# Patient Record
Sex: Male | Born: 1995 | Hispanic: No | Marital: Married | Smoking: Never smoker
Health system: Southern US, Community
[De-identification: ages and names within clinical notes are randomized; demographics above are authoritative.]

## PROBLEM LIST (undated history)

## (undated) DIAGNOSIS — H539 Unspecified visual disturbance: Secondary | ICD-10-CM

## (undated) DIAGNOSIS — R0789 Other chest pain: Secondary | ICD-10-CM

## (undated) DIAGNOSIS — H6983 Other specified disorders of Eustachian tube, bilateral: Secondary | ICD-10-CM

## (undated) DIAGNOSIS — J329 Chronic sinusitis, unspecified: Secondary | ICD-10-CM

## (undated) DIAGNOSIS — F341 Dysthymic disorder: Secondary | ICD-10-CM

## (undated) HISTORY — DX: Other specified disorders of eustachian tube, bilateral: H69.83

## (undated) HISTORY — DX: Unspecified visual disturbance: H53.9

## (undated) HISTORY — DX: Dysthymic disorder: F34.1

## (undated) HISTORY — DX: Other chest pain: R07.89

## (undated) HISTORY — DX: Chronic sinusitis, unspecified: J32.9

---
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
## 2016-05-18 ENCOUNTER — Encounter: Payer: Self-pay | Admitting: Osteopathic Medicine

## 2016-05-18 ENCOUNTER — Ambulatory Visit (INDEPENDENT_AMBULATORY_CARE_PROVIDER_SITE_OTHER): Payer: BLUE CROSS/BLUE SHIELD | Admitting: Osteopathic Medicine

## 2016-05-18 VITALS — BP 123/67 | HR 63 | Temp 98.5°F | Ht 66.0 in | Wt 148.0 lb

## 2016-05-18 DIAGNOSIS — H539 Unspecified visual disturbance: Secondary | ICD-10-CM

## 2016-05-18 DIAGNOSIS — J329 Chronic sinusitis, unspecified: Secondary | ICD-10-CM

## 2016-05-18 DIAGNOSIS — J019 Acute sinusitis, unspecified: Secondary | ICD-10-CM | POA: Diagnosis not present

## 2016-05-18 HISTORY — DX: Chronic sinusitis, unspecified: J32.9

## 2016-05-18 HISTORY — DX: Unspecified visual disturbance: H53.9

## 2016-05-18 MED ORDER — AMOXICILLIN-POT CLAVULANATE 875-125 MG PO TABS: 1.0000 | ORAL_TABLET | Freq: Two times a day (BID) | ORAL | 0 refills | Status: DC

## 2016-05-18 NOTE — Patient Instructions (Addendum)
I think your various symptoms can most easily be explained by a bacterial sinus infection and the pressure causing some headache & ear pain and feelings of malaise. We are going to treat this with antibiotics. If you are not doing any better after 5-7 days on the medication, or if you get worse or experience any significant changes in your symptoms seemed than that, please let us know. The next step would be to reexamine, get blood work, get a CT of the head/sinuses to evaluate for any other problems and confirm presence or resolution of a sinus infection. I advise you not to use marijuana.   Your vision screen in the office was normal. If you continue to have vision problems, I would recommend that he see an eye doctor since it has been 2 years since your last formal testing. Your glasses/contacts prescription may need to be updated.    If you have any other questions or concerns, please don't hesitate to give us a call. Otherwise, plan to follow-up for annual physical at your convenience.

## 2016-05-19 NOTE — Progress Notes (Signed)
HPI: Cody Roberts is a 20 y.o. male  who presents to [facility] today, 05/19/16,  for chief complaint of:  Chief Complaint  Patient presents with  . Establish Care    sinus problems     . Context: Sinus congestion, started beginning of this week. Works as a delivery person, had to call out of work. Also reporting some vision changes, more trouble with distance vision. . Location: Sinuses bilaterally . Quality: Congestion, some sore throat . Modifying factors: Intermittent marijuana use . Assoc signs/symptoms: No fever or chills  Patient is accompanied by grandmother who assists with history-taking.   Past medical, surgical, social and family history reviewed: No past medical history on file. No past surgical history on file. Social History  Substance Use Topics  . Smoking status: Never Smoker  . Smokeless tobacco: Never Used  . Alcohol use No   No family history on file.   Current medication list and allergy/intolerance information reviewed:   Current Outpatient Prescriptions  Medication Sig Dispense Refill  . amoxicillin-clavulanate (AUGMENTIN) 875-125 MG tablet Take 1 tablet by mouth 2 (two) times daily. 14 tablet 0   No current facility-administered medications for this visit.    No Known Allergies    Review of Systems:  Constitutional:  No  fever, no chills, +recent illness, No unintentional weight changes. No significant fatigue.   HEENT: No  headache, +vision change, no hearing change, +sore throat, +sinus pressure  Cardiac: No  chest pain, No  pressure, No palpitations, No  Orthopnea  Respiratory:  No  shortness of breath. +Cough  Gastrointestinal: No  abdominal pain, No  nausea, No  vomiting,  No  blood in stool, No  diarrhea, No  constipation   Musculoskeletal: No new myalgia/arthralgia  Genitourinary: No  incontinence, No  abnormal genital bleeding, No abnormal genital discharge  Skin: No  Rash, No other  wounds/concerning lesions  Hem/Onc: No  easy bruising/bleeding, No  abnormal lymph node  Endocrine: No cold intolerance,  No heat intolerance. No polyuria/polydipsia/polyphagia   Neurologic: No  weakness, No  dizziness, No  slurred speech/focal weakness/facial droop  Psychiatric: No  concerns with depression, No  concerns with anxiety, No sleep problems, No mood problems  Exam:  BP 123/67   Pulse 63   Temp 98.5 F (36.9 C) (Oral)   Ht 5\' 6"  (1.676 m)   Wt 148 lb (67.1 kg)   BMI 23.89 kg/m   Constitutional: VS see above. General Appearance: alert, well-developed, well-nourished, NAD  Eyes: Normal lids and conjunctive, non-icteric sclera  Ears, Nose, Mouth, Throat: MMM, Normal external inspection ears/nares/mouth/lips/gums. TM normal bilaterally. Pharynx/tonsils no erythema, no exudate. Nasal mucosa normal. PERRLA, EOMI  Neck: No masses, trachea midline. No thyroid enlargement. No tenderness/mass appreciated. No lymphadenopathy  Respiratory: Normal respiratory effort. no wheeze, no rhonchi, no rales  Cardiovascular: S1/S2 normal, no murmur, no rub/gallop auscultated. RRR. No lower extremity edema.     Musculoskeletal: Gait normal. No clubbing/cyanosis of digits.   Neurological:  Normal balance/coordination. No tremor.   Skin: warm, dry, intact. No rash/ulcer.   Psychiatric: Normal judgment/insight. Flattened mood and affect. Oriented x3.     ASSESSMENT/PLAN: Nonspecific sinus congestion, vague malaise-type illness. I think most likely bacterial sinusitis, patient was recently sick with more upper respiratory/cold symptoms. Declines work note. Vision screen normal here in the office but would still recommend formal follow-up since he is noticing some vision changes. I have a low suspicion for other neurologic process going on,  advised discontinue marijuana use, if symptoms persist/get worse, seek care ASAP  Subacute sinusitis, unspecified location - Plan:  amoxicillin-clavulanate (AUGMENTIN) 875-125 MG tablet  Vision changes   Patient Instructions  I think your various symptoms can most easily be explained by a bacterial sinus infection and the pressure causing some headache & ear pain and feelings of malaise. We are going to treat this with antibiotics. If you are not doing any better after 5-7 days on the medication, or if you get worse or experience any significant changes in your symptoms seemed than that, please let us know. The next step would be to reexamine, get blood work, get a CT of the head/sinuses to evaluate for any other problems and confirm presence or resolution of a sinus infection. I advise you not to use marijuana.   Your vision screen in the office was normal. If you continue to have vision problems, I would recommend that he see an eye doctor since it has been 2 years since your last formal testing. Your glasses/contacts prescription may need to be updated.    If you have any other questions or concerns, please don't hesitate to give us a call. Otherwise, plan to follow-up for annual physical at your convenience.    Visit summary with medication list and pertinent instructions was printed for patient to review. All questions at time of visit were answered - patient instructed to contact office with any additional concerns. ER/RTC precautions were reviewed with the patient. Follow-up plan: Return if symptoms worsen or fail to improve.

## 2017-04-18 ENCOUNTER — Ambulatory Visit (INDEPENDENT_AMBULATORY_CARE_PROVIDER_SITE_OTHER): Payer: BC Managed Care – PPO | Admitting: Osteopathic Medicine

## 2017-04-18 ENCOUNTER — Ambulatory Visit (INDEPENDENT_AMBULATORY_CARE_PROVIDER_SITE_OTHER): Payer: BC Managed Care – PPO

## 2017-04-18 ENCOUNTER — Encounter: Payer: Self-pay | Admitting: Osteopathic Medicine

## 2017-04-18 VITALS — BP 137/90 | HR 67 | Wt 167.0 lb

## 2017-04-18 DIAGNOSIS — R0789 Other chest pain: Secondary | ICD-10-CM

## 2017-04-18 DIAGNOSIS — F341 Dysthymic disorder: Secondary | ICD-10-CM | POA: Diagnosis not present

## 2017-04-18 DIAGNOSIS — Z23 Encounter for immunization: Secondary | ICD-10-CM

## 2017-04-18 DIAGNOSIS — H6983 Other specified disorders of Eustachian tube, bilateral: Secondary | ICD-10-CM

## 2017-04-18 HISTORY — DX: Other chest pain: R07.89

## 2017-04-18 NOTE — Patient Instructions (Addendum)
Plan:  Heart -  Echocardiogram and Holter Monitor to evaluate structure and rhythm of the heart Cardiology referral in case symptoms persist or tests are abnormal   Sinus -  Try Flonase +/- Claritin or similar  Mood -  Placed order for counseling, let me know if you'd like to try medications or have any other concerns

## 2017-04-18 NOTE — Progress Notes (Signed)
HPI: Cody Roberts is a 21 y.o. male  who presents to [facility] today, 04/18/17,  for chief complaint of:  Chief Complaint  Patient presents with  . Chest Pain    Chest discomfort: new problem - over the past 2 weeks, has noted initially feeling of chest tightness followed by intermittent feelings of chest discomfort, seems to migrate around chest, doesn't seem to be triggered or relieved by anything. No chest pain or pressure with exertion. No shortness of breath, no dizziness.  Ear fullness: Comes and goes, bilateral ear fullness sensation and occasional popping sensation, thinks he may need ears flushed today. No hearing impairment, no headache or vision change.  Positive depression screening: new problem - Patient states that he has struggled with depression on and off for many years. Has never felt the need to be on medications and does not want to pursue medications at this point but he would like to speak to a counselor.  Patient is accompanied by mom who assists with history-taking.   Past medical, surgical, social and family history reviewed: Patient Active Problem List   Diagnosis Date Noted  . Sinusitis 05/18/2016  . Vision changes 05/18/2016   No past surgical history on file. Social History  Substance Use Topics  . Smoking status: Never Smoker  . Smokeless tobacco: Never Used  . Alcohol use No   No family history on file.   Current medication list and allergy/intolerance information reviewed:   Current Outpatient Prescriptions  Medication Sig Dispense Refill  . amoxicillin-clavulanate (AUGMENTIN) 875-125 MG tablet Take 1 tablet by mouth 2 (two) times daily. 14 tablet 0   No current facility-administered medications for this visit.    No Known Allergies    Review of Systems:  Constitutional:  No  fever, no chills, No recent illness, No unintentional weight changes.  HEENT: No  headache, no vision change, no hearing  change, No sore throat, +ear and sinus pressure  Cardiac: +chest pain, +pressure, see HPI, No palpitations, No  Orthopnea  Respiratory:  No  shortness of breath. No  Cough  Gastrointestinal: No  abdominal pain, No  nausea, No  vomiting  Musculoskeletal: No new myalgia/arthralgia  Skin: No  Rash  Neurologic: No  weakness, No  dizziness, No  slurred No weakness/facial droop  Psychiatric: +concerns with depression, +concerns with anxiety, No sleep problems, No mood problems  Exam:  BP 137/90   Pulse 67   Wt 167 lb (75.8 kg)   BMI 26.95 kg/m   Constitutional: VS see above. General Appearance: alert, well-developed, well-nourished, NAD  Eyes: Normal lids and conjunctive, non-icteric sclera  Ears, Nose, Mouth, Throat: MMM, Normal external inspection ears/nares/mouth/lips/gums. TM normal bilaterally and minimal cerumen in external canals. Pharynx/tonsils no erythema, no exudate. Nasal mucosa normal.   Neck: No masses, trachea midline. No thyroid enlargement. No tenderness/mass appreciated. No lymphadenopathy  Respiratory: Normal respiratory effort. no wheeze, no rhonchi, no rales  Cardiovascular: S1/S2 normal, no murmur, no rub/gallop auscultated. RRR. No lower extremity edema. Chest wall is nontender  Gastrointestinal: Nontender, no masses.   Musculoskeletal: Gait normal. Symmetric and independent movement all extremities, strength 5 out of 5 in all extremities  Neurological: Normal balance/coordination. No tremor. No cranial nerve deficit on limited exam. Motor and sensation intact and symmetric. Cerebellar reflexes intact.     Psychiatric: Normal judgment/insight. Normal mood and affect. Oriented x3.    Dg Chest 2 View  Result Date: 04/18/2017 CLINICAL DATA:  Chest discomfort for 2  weeks. EXAM: CHEST  2 VIEW COMPARISON:  None. FINDINGS: The heart size and mediastinal contours are within normal limits. Both lungs are clear. No evidence of pneumothorax or pleural effusion. The  visualized skeletal structures are unremarkable. IMPRESSION: Negative.  No active cardiopulmonary disease. Electronically Signed   By: Myles Rosenthal M.D.   On: 04/18/2017 15:49   Depression screen PHQ 2/9 04/18/2017  Decreased Interest 3  Down, Depressed, Hopeless 2  PHQ - 2 Score 5  Altered sleeping 2  Tired, decreased energy 1  Change in appetite 1  Feeling bad or failure about yourself  2  Trouble concentrating 0  Moving slowly or fidgety/restless 0  Suicidal thoughts 0  PHQ-9 Score 11    EKG interpretation: Rate: 67  Rhythm: sinus No ST/T changes concerning for acute ischemia/infarct     ASSESSMENT/PLAN:   Chest tightness - Vague symptoms of discomfort/pain without angina or other alarm symptoms. Consider respiratory or psychiatric component versus cardiac. W/u and refer - Plan: EKG 12-Lead, Holter monitor - 48 hour, ECHOCARDIOGRAM COMPLETE, Ambulatory referral to Cardiology, CBC, COMPLETE METABOLIC PANEL WITH GFR, Lipid panel, TSH, Magnesium, DG Chest 2 View  Need for immunization against influenza - Plan: Flu Vaccine QUAD 36+ mos IM  Dysthymia - Referral downstairs to behavioral health for counseling, patient is advised on mental health safety precautions, can come to me if he would like to discuss meds - Plan: CBC, COMPLETE METABOLIC PANEL WITH GFR, TSH, Ambulatory referral to Behavioral Health  Eustachian tube dysfunction, bilateral - Canals clear and tympanic membranes normal, advised OTC Flonase and or second generation antihistamine Claritin/Zyrtec/Allegra or similar generic    Patient Instructions  Plan:  Heart -  Echocardiogram and Holter Monitor to evaluate structure and rhythm of the heart Cardiology referral in case symptoms persist or tests are abnormal   Sinus -  Try Flonase +/- Claritin or similar  Mood -  Placed order for counseling, let me know if you'd like to try medications or have any other concerns     Visit summary with medication list and  pertinent instructions was printed for patient to review. All questions at time of visit were answered - patient instructed to contact office with any additional concerns. ER/RTC precautions were reviewed with the patient. Follow-up plan: Return in about 2 weeks (around 05/02/2017) for recheck symptoms, annual physical when due .  Note: Total time spent 40 minutes, greater than 50% of the visit was spent face-to-face counseling and coordinating care for the following: The primary encounter diagnosis was Chest tightness. Diagnoses of Need for immunization against influenza, Dysthymia, and Eustachian tube dysfunction, bilateral were also pertinent to this visit.Marland Kitchen

## 2017-04-19 DIAGNOSIS — F341 Dysthymic disorder: Secondary | ICD-10-CM

## 2017-04-19 DIAGNOSIS — H6983 Other specified disorders of Eustachian tube, bilateral: Secondary | ICD-10-CM | POA: Insufficient documentation

## 2017-04-19 DIAGNOSIS — H6993 Unspecified Eustachian tube disorder, bilateral: Secondary | ICD-10-CM

## 2017-04-19 HISTORY — DX: Unspecified eustachian tube disorder, bilateral: H69.93

## 2017-04-19 HISTORY — DX: Dysthymic disorder: F34.1

## 2017-04-19 HISTORY — DX: Other specified disorders of eustachian tube, bilateral: H69.83

## 2017-05-09 ENCOUNTER — Ambulatory Visit: Payer: BC Managed Care – PPO | Admitting: Cardiology

## 2017-06-04 ENCOUNTER — Telehealth (HOSPITAL_COMMUNITY): Payer: Self-pay | Admitting: Osteopathic Medicine

## 2017-06-04 NOTE — Telephone Encounter (Signed)
04/19/17 VM not setup unable to leave message EVD 04/20/17 spoke with patient. He is f/w Dr. Dulce Sellar to see if test needs to be done. evd 05/23/17 spoke with patient. He still has not spoken to Dr. Dulce Sellar regarding the echo evd 06/04/17 Called pt and lmsg for him to CB to get scheduled for echo..RG  He will be removed from the workqueue.

## 2018-09-05 ENCOUNTER — Ambulatory Visit (INDEPENDENT_AMBULATORY_CARE_PROVIDER_SITE_OTHER): Payer: BC Managed Care – PPO | Admitting: Osteopathic Medicine

## 2018-09-05 ENCOUNTER — Encounter: Payer: Self-pay | Admitting: Osteopathic Medicine

## 2018-09-05 VITALS — BP 131/81 | HR 63 | Wt 153.7 lb

## 2018-09-05 DIAGNOSIS — F322 Major depressive disorder, single episode, severe without psychotic features: Secondary | ICD-10-CM | POA: Diagnosis not present

## 2018-09-05 DIAGNOSIS — Z23 Encounter for immunization: Secondary | ICD-10-CM | POA: Diagnosis not present

## 2018-09-05 MED ORDER — FLUOXETINE HCL 10 MG PO CAPS: 10.0000 mg | ORAL_CAPSULE | Freq: Every day | ORAL | 0 refills | Status: DC

## 2018-09-05 NOTE — Patient Instructions (Signed)
We are starting a medication today called fluoxetine aka Prozac to help treat your dysthymia/depression. This is a daily medication to help your symptoms.   Continue counseling!   Expect a call or message form this office in the next 2 weeks to check in: If you're doing well on the medicine but not feeling any effect, we can increase the dose. If you're starting to feel some effect/improvement, we can hold off on a dose increase and reevaluate at your office visit.   Let's plan to follow up in the office in 4-6 weeks. At that time, we can talk about how well the medicine is working for you, and we can consider increasing the dose, adding another medicine, etc.   If we are having trouble finding a good medication regimen for you, we can consult with a psychiatrist to assist with medication management.   If you experience problematic side effects, please let me know ASAP - we can switch the medicine any time, and we don't need an appointment for this. If you have any concerns, please call me!  For immediate mental health services:  [facility], [address] [place], [place] [address], [phone]  [facility], [address], [place], [place] [address], [phone]  Any emergency room  National Suicide Prevention Lifeline, [phone]  Any questions or concerns, call me!

## 2018-09-05 NOTE — Progress Notes (Signed)
HPI: Cody Roberts is a 23 y.o. male who  has a past medical history of Chest tightness (04/18/2017), Dysthymia (04/19/2017), Eustachian tube dysfunction, bilateral (04/19/2017), Sinusitis (05/18/2016), and Vision changes (05/18/2016).  he presents to [facility] today, 09/05/18,  for chief complaint of:  Severe depression, anxiety  . Anxiety and Depression are getting really bad. He lost his job, dropped out of school this August. He is noting more mood problems lately.  . Modifying factors: seeing counselor, working diagnosis of MDD and dysthymia per patient.  . Assoc signs/symptoms: anxiety, trouble sleeping, low appetitie   Depression screen [facility] 2/9 09/05/2018 04/18/2017  Decreased Interest 3 3  Down, Depressed, Hopeless 3 2  PHQ - 2 Score 6 5  Altered sleeping 3 2  Tired, decreased energy 2 1  Change in appetite 3 1  Feeling bad or failure about yourself  3 2  Trouble concentrating 2 0  Moving slowly or fidgety/restless 2 0  Suicidal thoughts 2 0  PHQ-9 Score 23 11  Difficult doing work/chores Extremely dIfficult -   GAD 7 : Generalized Anxiety Score 09/05/2018  Nervous, Anxious, on Edge 2  Control/stop worrying 3  Worry too much - different things 3  Trouble relaxing 2  Restless 1  Easily annoyed or irritable 3  Afraid - awful might happen 3  Total GAD 7 Score 17  Anxiety Difficulty Very difficult          At today's visit... Past medical history, surgical history, and family history reviewed and updated as needed.  Current medication list and allergy/intolerance information reviewed and updated as needed. (See remainder of HPI, ROS, Phys Exam below)        ASSESSMENT/PLAN: The primary encounter diagnosis was Current severe episode of major depressive disorder without psychotic features, unspecified whether recurrent (HCC). Diagnoses of Need for influenza vaccination and Need for Tdap vaccination were also pertinent to this  visit.  Discussion of risks versus benefits of medication, patient feels ready to try some sort of medicine as well as continue counseling.  Reports occasional suicidal ideation but states "this would be a permanent solution to a temporary problem" so he does not have any plans to harm himself, does not feel that he is a danger to his own wellbeing.  Patient was verbally contracted for safety, resources provided for urgent psychiatric care if needed, see patient instructions.  Orders Placed This Encounter  Procedures  . Flu Vaccine QUAD 6+ mos PF IM (Fluarix Quad PF)  . Tdap vaccine greater than or equal to 7yo IM  . CBC  . COMPLETE METABOLIC PANEL WITH GFR  . TSH  . Magnesium     Meds ordered this encounter  Medications  . FLUoxetine (PROZAC) 10 MG capsule    Sig: Take 1 capsule (10 mg total) by mouth daily.    Dispense:  90 capsule    Refill:  0    Patient Instructions  We are starting a medication today called fluoxetine aka Prozac to help treat your dysthymia/depression. This is a daily medication to help your symptoms.   Continue counseling!   Expect a call or message form this office in the next 2 weeks to check in: If you're doing well on the medicine but not feeling any effect, we can increase the dose. If you're starting to feel some effect/improvement, we can hold off on a dose increase and reevaluate at your office visit.   Let's plan to follow up in the  office in 4-6 weeks. At that time, we can talk about how well the medicine is working for you, and we can consider increasing the dose, adding another medicine, etc.   If we are having trouble finding a good medication regimen for you, we can consult with a psychiatrist to assist with medication management.   If you experience problematic side effects, please let me know ASAP - we can switch the medicine any time, and we don't need an appointment for this. If you have any concerns, please call me!  For immediate mental  health services:  [facility], [address], [place], [place] [address], [phone]  [facility], [address] [place], [place] [address], [phone]  Any emergency room  National Suicide Prevention Lifeline, [phone]  Any questions or concerns, call me!       Follow-up plan: Return in about 4 weeks (around 10/03/2018) for recheck moods on new medicine. See me sooner if needed! .                             ############################################ ############################################ ############################################ ############################################    No outpatient medications have been marked as taking for the 09/05/18 encounter (Office Visit) with Sunnie Nielsen, DO.    No Known Allergies     Review of Systems:  Constitutional: No recent illness  HEENT: No  headache, no vision change  Cardiac: No  chest pain, No  pressure, No palpitations  Respiratory:  No  shortness of breath. No  Cough  Neurologic: No  weakness, No  Dizziness  Psychiatric: +concerns with depression, +concerns with anxiety  Exam:  BP 131/81 (BP Location: Left Arm, Patient Position: Sitting, Cuff Size: Normal)   Pulse 63   Wt 153 lb 11.2 oz (69.7 kg)   BMI 24.81 kg/m   Constitutional: VS see above. General Appearance: alert, well-developed, well-nourished, NAD  Eyes: Normal lids and conjunctive, non-icteric sclera  Ears, Nose, Mouth, Throat: MMM, Normal external inspection ears/nares/mouth/lips/gums.  Neck: No masses, trachea midline.   Respiratory: Normal respiratory effort. no wheeze, no rhonchi, no rales  Cardiovascular: S1/S2 normal, no murmur, no rub/gallop auscultated. RRR.   Musculoskeletal: Gait normal. Symmetric and independent movement of all extremities  Neurological: Normal balance/coordination. No tremor.  Skin: warm, dry, intact.    Psychiatric: Normal judgment/insight. Normal mood and affect. Oriented x3.       Visit summary with medication list and pertinent instructions was printed for patient to review, patient was advised to alert [place] if any updates are needed. All questions at time of visit were answered - patient instructed to contact office with any additional concerns. ER/RTC precautions were reviewed with the patient and understanding verbalized.   Note: Total time spent 25 minutes, greater than 50% of the visit was spent face-to-face counseling and coordinating care for the following: The primary encounter diagnosis was Current severe episode of major depressive disorder without psychotic features, unspecified whether recurrent (HCC). Diagnoses of Need for influenza vaccination and Need for Tdap vaccination were also pertinent to this visit.Marland Kitchen  Please note: voice recognition software was used to produce this document, and typos may escape review. Please contact Dr. Lyn Hollingshead for any needed clarifications.    Follow up plan: Return in about 4 weeks (around 10/03/2018) for recheck moods on new medicine. See me sooner if needed! Marland Kitchen

## 2018-09-06 ENCOUNTER — Encounter: Payer: Self-pay | Admitting: Osteopathic Medicine

## 2018-09-06 LAB — CBC
HCT: 47.6 % (ref 38.5–50.0)
Hemoglobin: 17.3 g/dL — ABNORMAL HIGH (ref 13.2–17.1)
MCH: 31.1 pg (ref 27.0–33.0)
MCHC: 36.3 g/dL — AB (ref 32.0–36.0)
MCV: 85.6 fL (ref 80.0–100.0)
MPV: 12.5 fL (ref 7.5–12.5)
PLATELETS: 207 10*3/uL (ref 140–400)
RBC: 5.56 10*6/uL (ref 4.20–5.80)
RDW: 13.6 % (ref 11.0–15.0)
WBC: 3 10*3/uL — AB (ref 3.8–10.8)

## 2018-09-06 LAB — COMPLETE METABOLIC PANEL WITH GFR
AG RATIO: 2.4 (calc) (ref 1.0–2.5)
ALBUMIN MSPROF: 5.1 g/dL (ref 3.6–5.1)
ALT: 13 U/L (ref 9–46)
AST: 17 U/L (ref 10–40)
Alkaline phosphatase (APISO): 63 U/L (ref 40–115)
BUN: 10 mg/dL (ref 7–25)
CO2: 28 mmol/L (ref 20–32)
CREATININE: 1.17 mg/dL (ref 0.60–1.35)
Calcium: 10.2 mg/dL (ref 8.6–10.3)
Chloride: 101 mmol/L (ref 98–110)
GFR, EST NON AFRICAN AMERICAN: 88 mL/min/{1.73_m2} (ref >=60)
GFR, Est African American: 102 mL/min/{1.73_m2} (ref >=60)
GLOBULIN: 2.1 g/dL (ref 1.9–3.7)
Glucose, Bld: 88 mg/dL (ref 65–99)
POTASSIUM: 4.8 mmol/L (ref 3.5–5.3)
SODIUM: 137 mmol/L (ref 135–146)
Total Bilirubin: 1.7 mg/dL — ABNORMAL HIGH (ref 0.2–1.2)
Total Protein: 7.2 g/dL (ref 6.1–8.1)

## 2018-09-06 LAB — MAGNESIUM: MAGNESIUM: 2.1 mg/dL (ref 1.5–2.5)

## 2018-09-06 LAB — TSH: TSH: 1.71 mIU/L (ref 0.40–4.50)

## 2018-10-07 ENCOUNTER — Encounter: Payer: Self-pay | Admitting: Osteopathic Medicine

## 2018-10-07 ENCOUNTER — Ambulatory Visit (INDEPENDENT_AMBULATORY_CARE_PROVIDER_SITE_OTHER): Payer: BC Managed Care – PPO | Admitting: Osteopathic Medicine

## 2018-10-07 VITALS — BP 133/83 | HR 91 | Temp 98.2°F | Wt 152.4 lb

## 2018-10-07 DIAGNOSIS — F322 Major depressive disorder, single episode, severe without psychotic features: Secondary | ICD-10-CM

## 2018-10-07 MED ORDER — FLUOXETINE HCL 10 MG PO CAPS
20.0000 mg | ORAL_CAPSULE | Freq: Every day | ORAL | 0 refills | Status: DC
Start: 2018-10-07 — End: 2018-11-05

## 2018-10-07 NOTE — Patient Instructions (Addendum)
This week:  Increase fluoxetine from 10 mg to 20 mg - double up on the pills you're taking. Will call you next Monday to see if this is helping.   Please call me ASAP if you are having troubling side effects!   If doing ok, can increase further and/or add another medicine, Welbutrin  If the Fluoxetine +/- Wellbutrin isn't helping, will see if we can get you approved for one of the newer medicines, Trintellix / Viibryd.   For immediate mental health services if needed:   [facility], [address], [place], [place] [address], [phone]  [facility], [address] [place], [place] [address], [phone]  Any emergency room  National Suicide Prevention Lifeline, [phone]   Will plan to recheck blood work in a couple months just to monitor minor abnormalities.

## 2018-10-07 NOTE — Progress Notes (Signed)
HPI: Cody Roberts is a 23 y.o. male who  has a past medical history of Chest tightness (04/18/2017), Dysthymia (04/19/2017), Eustachian tube dysfunction, bilateral (04/19/2017), Sinusitis (05/18/2016), and Vision changes (05/18/2016).  he presents to [facility] today, 10/07/18,  for chief complaint of:  Depression follow-up  We decided last visit to start fluoxetine 10 mg and pt to continue counseling.   He has been taking the medicine but not noticing any difference in moods, maybe a bit less anxiety but he's also not really leaving home much. Suicidal thoughts have decreased.   He feels he's plateau'ed a bit with counseling but still going.    Depression screen [facility] 2/9 10/07/2018 09/05/2018 04/18/2017  Decreased Interest 3 3 3   Down, Depressed, Hopeless 3 3 2   PHQ - 2 Score 6 6 5   Altered sleeping 3 3 2   Tired, decreased energy 3 2 1   Change in appetite 2 3 1   Feeling bad or failure about yourself  3 3 2   Trouble concentrating 2 2 0  Moving slowly or fidgety/restless 2 2 0  Suicidal thoughts 1 2 0  PHQ-9 Score 22 23 11   Difficult doing work/chores Extremely dIfficult Extremely dIfficult -   GAD 7 : Generalized Anxiety Score 10/07/2018 09/05/2018  Nervous, Anxious, on Edge 2 2  Control/stop worrying 1 3  Worry too much - different things 2 3  Trouble relaxing 1 2  Restless 1 1  Easily annoyed or irritable 3 3  Afraid - awful might happen 3 3  Total GAD 7 Score 13 17  Anxiety Difficulty Extremely difficult Very difficult          At today's visit 10/07/18 ... PMH, PSH, FH reviewed and updated as needed.  Current medication list and allergy/intolerance hx reviewed and updated as needed. (See remainder of HPI, ROS, Phys Exam below)       ASSESSMENT/PLAN: The encounter diagnosis was Current severe episode of major depressive disorder without psychotic features, unspecified whether recurrent (HCC).  Increase Prozac. Low threshold for  augmenting with Wellbutrin or Ability. Consider newer Trintellix or Viibryd if the above isn't making a difference.    Meds ordered this encounter  Medications  . FLUoxetine (PROZAC) 10 MG capsule    Sig: Take 2 capsules (20 mg total) by mouth daily.    Dispense:  90 capsule    Refill:  0    Patient Instructions  This week:  Increase fluoxetine from 10 mg to 20 mg - double up on the pills you're taking. Will call you next Monday to see if this is helping.   Please call me ASAP if you are having troubling side effects!   If doing ok, can increase further and/or add another medicine, Welbutrin  If the Fluoxetine +/- Wellbutrin isn't helping, will see if we can get you approved for one of the newer medicines, Trintellix / Viibryd.   For immediate mental health services if needed:   [facility], [address], [place], [place] [address], [phone]  [facility], [address], [place], [place] [address], [phone]  Any emergency room  National Suicide Prevention [facility]eline, [phone]   Will plan to recheck blood work in a couple months just to monitor minor abnormalities.         Follow-up plan: Return in about 4 weeks (around 11/04/2018) for recheck moods, sooner if needed.                                                 ################################################# ################################################# ################################################# #################################################  Current Meds  Medication Sig  . FLUoxetine (PROZAC) 10 MG capsule Take 2 capsules (20 mg total) by mouth daily.  . [DISCONTINUED] FLUoxetine (PROZAC) 10 MG capsule Take 1 capsule (10 mg total) by mouth daily.    No Known Allergies     Review of Systems:  Constitutional: No recent illness  HEENT: No  headache  Respiratory:  No  shortness  of breath.   Gastrointestinal: No  abdominal pain, no change on bowel habits  Neurologic: No  weakness, No  Dizziness  Psychiatric: +concerns with depression, +concerns with anxiety  Exam:  BP 133/83 (BP Location: Left Arm, Patient Position: Sitting, Cuff Size: Normal)   Pulse 91   Temp 98.2 F (36.8 C) (Oral)   Wt 152 lb 6.4 oz (69.1 kg)   BMI 24.60 kg/m   Constitutional: VS see above. General Appearance: alert, well-developed, well-nourished, NAD  Respiratory: Normal respiratory effort.   Musculoskeletal: Gait normal. Symmetric and independent movement of all extremities  Neurological: Normal balance/coordination. No tremor.  Skin: warm, dry, intact.   Psychiatric: Normal judgment/insight. Normal mood and affect. Oriented x3.       Visit summary with medication list and pertinent instructions was printed for patient to review, patient was advised to alert [place] if any updates are needed. All questions at time of visit were answered - patient instructed to contact office with any additional concerns. ER/RTC precautions were reviewed with the patient and understanding verbalized.  Please note: voice recognition software was used to produce this document, and typos may escape review. Please contact Dr. Lyn Hollingshead for any needed clarifications.    Follow up plan: Return in about 4 weeks (around 11/04/2018) for recheck moods, sooner if needed.

## 2018-10-15 ENCOUNTER — Telehealth: Payer: Self-pay | Admitting: Osteopathic Medicine

## 2018-10-15 NOTE — Telephone Encounter (Signed)
error 

## 2018-10-15 NOTE — Telephone Encounter (Signed)
As per pt - no issues or concerns since starting Prozac medication. He states that he is feeling good. Pt has confirmed to keep upcoming appt on 11/05/18. No other inquiries during call.

## 2018-10-15 NOTE — Telephone Encounter (Signed)
Please call patient:  Just calling to see how he is doing on the medications (Prozac/fluoxetine) we started a couple weeks ago, make sure no other questions or concerns. Be sure to keep appointment for follow-up 11/05/18.

## 2018-11-05 ENCOUNTER — Other Ambulatory Visit: Payer: Self-pay

## 2018-11-05 ENCOUNTER — Ambulatory Visit (INDEPENDENT_AMBULATORY_CARE_PROVIDER_SITE_OTHER): Payer: BC Managed Care – PPO | Admitting: Osteopathic Medicine

## 2018-11-05 ENCOUNTER — Encounter: Payer: Self-pay | Admitting: Osteopathic Medicine

## 2018-11-05 VITALS — BP 130/87 | HR 74 | Temp 98.3°F | Wt 147.8 lb

## 2018-11-05 DIAGNOSIS — F322 Major depressive disorder, single episode, severe without psychotic features: Secondary | ICD-10-CM

## 2018-11-05 DIAGNOSIS — R0981 Nasal congestion: Secondary | ICD-10-CM | POA: Diagnosis not present

## 2018-11-05 MED ORDER — BUPROPION HCL ER (XL) 150 MG PO TB24: 150.0000 mg | ORAL_TABLET | ORAL | 0 refills | Status: DC

## 2018-11-05 MED ORDER — FLUOXETINE HCL 40 MG PO CAPS: 40.0000 mg | ORAL_CAPSULE | Freq: Every day | ORAL | 0 refills | Status: DC

## 2018-11-05 NOTE — Progress Notes (Signed)
HPI: Cody Roberts is a 23 y.o. male who  has a past medical history of Chest tightness (04/18/2017), Dysthymia (04/19/2017), Eustachian tube dysfunction, bilateral (04/19/2017), Sinusitis (05/18/2016), and Vision changes (05/18/2016).  he presents to [facility] today, 11/05/18,  for chief complaint of:  Recheck depression   Doing ok on increased Prozac, making some breakthroughs in therapy. Not really feeling a ton better, though.   Also concerned about allergies, waking u pwith ear pressure worse on R    Depression screen [facility] 2/9 11/05/2018 10/07/2018 09/05/2018  Decreased Interest 3 3 3   Down, Depressed, Hopeless 3 3 3   PHQ - 2 Score 6 6 6   Altered sleeping 3 3 3   Tired, decreased energy 3 3 2   Change in appetite 2 2 3   Feeling bad or failure about yourself  3 3 3   Trouble concentrating 2 2 2   Moving slowly or fidgety/restless 1 2 2   Suicidal thoughts 2 1 2   PHQ-9 Score 22 22 23   Difficult doing work/chores Extremely dIfficult Extremely dIfficult Extremely dIfficult   GAD 7 : Generalized Anxiety Score 11/05/2018 10/07/2018 09/05/2018  Nervous, Anxious, on Edge 1 2 2   Control/stop worrying 2 1 3   Worry too much - different things 2 2 3   Trouble relaxing 2 1 2   Restless 1 1 1   Easily annoyed or irritable 3 3 3   Afraid - awful might happen 2 3 3   Total GAD 7 Score 13 13 17   Anxiety Difficulty Very difficult Extremely difficult Very difficult          At today's visit 11/05/18 ... PMH, PSH, FH reviewed and updated as needed.  Current medication list and allergy/intolerance hx reviewed and updated as needed. (See remainder of HPI, ROS, Phys Exam below)          ASSESSMENT/PLAN: The primary encounter diagnosis was Current severe episode of major depressive disorder without psychotic features, unspecified whether recurrent (HCC). A diagnosis of Sinus congestion was also pertinent to this visit.    Meds ordered this encounter   Medications  . FLUoxetine (PROZAC) 40 MG capsule    Sig: Take 1 capsule (40 mg total) by mouth daily.    Dispense:  90 capsule    Refill:  0  . buPROPion (WELLBUTRIN XL) 150 MG 24 hr tablet    Sig: Take 1 tablet (150 mg total) by mouth every morning.    Dispense:  90 tablet    Refill:  0    Patient Instructions  This week: increase Prozac from 20 to 40 mg This weekend, add Wellbutrin 150 mg Call/message me in 2-4 weeks to check in!   Allergies: . steroid nasal spray such as Flonase or Nasonex or one of their generic equivalents, PLUS... . an antihistamine such as Allegra, Zyrtec, or Claritin or one of their generic equivalents.  . Can also add a decongestant, either Sudafed on its own OR any of the antihistamines with the "-D" in the name that he has to show your ID to the pharmacist to buy.  . I recommend avoiding Afrin nasal spray unless absolutely needed, and then using it only for 2 days to prevent rebound congestion when the medicine is stopped.  If the combination isn't helping after about 2 weeks, let me know!         Follow-up plan: Return for recheck depending on response to Rx (message me!).                                                 ################################################# ################################################# ################################################# #################################################  Current Meds  Medication Sig  . [DISCONTINUED] FLUoxetine (PROZAC) 10 MG capsule Take 2 capsules (20 mg total) by mouth daily.    No Known Allergies     Review of Systems:  Constitutional: No recent illness  HEENT: No  headache, no vision change  Cardiac: No  chest pain  Respiratory:  No  shortness of breath. No  Cough  Gastrointestinal: No  abdominal pain, no change on bowel habits  Psychiatric: +concerns with depression, +concerns with anxiety  Exam:  BP 130/87  (BP Location: Left Arm, Patient Position: Sitting, Cuff Size: Normal)   Pulse 74   Temp 98.3 F (36.8 C) (Oral)   Wt 147 lb 12.8 oz (67 kg)   BMI 23.86 kg/m   Constitutional: VS see above. General Appearance: alert, well-developed, well-nourished, NAD  Eyes: Normal lids and conjunctive, non-icteric sclera  Ears, Nose, Mouth, Throat: MMM, Normal external inspection ears/nares/mouth/lips/gums. Normal TM bilaterally   Neck: No masses, trachea midline.   Respiratory: Normal respiratory effort.   Musculoskeletal: Gait normal.   Neurological: Normal balance/coordination. No tremor.  Skin: warm, dry, intact.   Psychiatric: Normal judgment/insight. Normal mood and affect. Oriented x3.       Visit summary with medication list and pertinent instructions was printed for patient to review, patient was advised to alert [place] if any updates are needed. All questions at time of visit were answered - patient instructed to contact office with any additional concerns. ER/RTC precautions were reviewed with the patient and understanding verbalized.   Please note: voice recognition software was used to produce this document, and typos may escape review. Please contact Dr. Lyn Hollingshead for any needed clarifications.    Follow up plan: Return for recheck depending on response to Rx (message me!).

## 2018-11-05 NOTE — Patient Instructions (Addendum)
This week: increase Prozac from 20 to 40 mg This weekend, add Wellbutrin 150 mg Call/message me in 2-4 weeks to check in!   Allergies: . steroid nasal spray such as Flonase or Nasonex or one of their generic equivalents, PLUS... . an antihistamine such as Allegra, Zyrtec, or Claritin or one of their generic equivalents.  . Can also add a decongestant, either Sudafed on its own OR any of the antihistamines with the "-D" in the name that he has to show your ID to the pharmacist to buy.  . I recommend avoiding Afrin nasal spray unless absolutely needed, and then using it only for 2 days to prevent rebound congestion when the medicine is stopped.  If the combination isn't helping after about 2 weeks, let me know!

## 2020-10-24 ENCOUNTER — Ambulatory Visit (HOSPITAL_COMMUNITY)
Admission: EM | Admit: 2020-10-24 | Discharge: 2020-10-25 | Disposition: A | Discharge status: Home / Self Care | Payer: BC Managed Care – PPO | Attending: Psychiatry | Admitting: Psychiatry

## 2020-10-24 ENCOUNTER — Other Ambulatory Visit: Payer: Self-pay

## 2020-10-24 DIAGNOSIS — Z79899 Other long term (current) drug therapy: Secondary | ICD-10-CM | POA: Insufficient documentation

## 2020-10-24 DIAGNOSIS — F333 Major depressive disorder, recurrent, severe with psychotic symptoms: Secondary | ICD-10-CM

## 2020-10-24 DIAGNOSIS — Z20822 Contact with and (suspected) exposure to covid-19: Secondary | ICD-10-CM | POA: Diagnosis not present

## 2020-10-24 DIAGNOSIS — F1910 Other psychoactive substance abuse, uncomplicated: Secondary | ICD-10-CM

## 2020-10-24 DIAGNOSIS — F332 Major depressive disorder, recurrent severe without psychotic features: Secondary | ICD-10-CM | POA: Clinically undetermined

## 2020-10-24 DIAGNOSIS — F1999 Other psychoactive substance use, unspecified with unspecified psychoactive substance-induced disorder: Secondary | ICD-10-CM | POA: Insufficient documentation

## 2020-10-24 LAB — CBC WITH DIFFERENTIAL/PLATELET
Abs Immature Granulocytes: 0.02 10*3/uL (ref 0.00–0.07)
Basophils Absolute: 0 10*3/uL (ref 0.0–0.1)
Basophils Relative: 0 %
Eosinophils Absolute: 0 10*3/uL (ref 0.0–0.5)
Eosinophils Relative: 0 %
HCT: 42.7 % (ref 39.0–52.0)
Hemoglobin: 16.1 g/dL (ref 13.0–17.0)
Immature Granulocytes: 0 %
Lymphocytes Relative: 18 %
Lymphs Abs: 1.1 10*3/uL (ref 0.7–4.0)
MCH: 30.3 pg (ref 26.0–34.0)
MCHC: 37.7 g/dL — ABNORMAL HIGH (ref 30.0–36.0)
MCV: 80.4 fL (ref 80.0–100.0)
Monocytes Absolute: 0.6 10*3/uL (ref 0.1–1.0)
Monocytes Relative: 10 %
Neutro Abs: 4.6 10*3/uL (ref 1.7–7.7)
Neutrophils Relative %: 72 %
Platelets: 225 10*3/uL (ref 150–400)
RBC: 5.31 MIL/uL (ref 4.22–5.81)
RDW: 12.5 % (ref 11.5–15.5)
WBC: 6.4 10*3/uL (ref 4.0–10.5)
nRBC: 0 % (ref 0.0–0.2)

## 2020-10-24 LAB — POCT URINE DRUG SCREEN - MANUAL ENTRY (I-SCREEN)
POC Cocaine UR: NOT DETECTED
POC Marijuana UR: POSITIVE — AB
POC Methamphetamine UR: NOT DETECTED
POC Oxycodone UR: NOT DETECTED
POC Secobarbital (BAR): NOT DETECTED

## 2020-10-24 LAB — LIPID PANEL
Cholesterol: 214 mg/dL — ABNORMAL HIGH (ref 0–200)
HDL: 57 mg/dL (ref >40)
LDL Cholesterol: 146 mg/dL — ABNORMAL HIGH (ref 0–99)
Total CHOL/HDL Ratio: 3.8 RATIO
Triglycerides: 57 mg/dL (ref <150)
VLDL: 11 mg/dL (ref 0–40)

## 2020-10-24 LAB — BASIC METABOLIC PANEL
Anion gap: 13 (ref 5–15)
BUN: 13 mg/dL (ref 6–20)
CO2: 24 mmol/L (ref 22–32)
Calcium: 10 mg/dL (ref 8.9–10.3)
Chloride: 97 mmol/L — ABNORMAL LOW (ref 98–111)
Creatinine, Ser: 1.21 mg/dL (ref 0.61–1.24)
GFR, Estimated: >60 mL/min (ref >60)
Glucose, Bld: 84 mg/dL (ref 70–99)
Potassium: 3.6 mmol/L (ref 3.5–5.1)
Sodium: 134 mmol/L — ABNORMAL LOW (ref 135–145)

## 2020-10-24 LAB — TSH: TSH: 1.422 u[IU]/mL (ref 0.350–4.500)

## 2020-10-24 LAB — POCT URINE DRUG SCREEN - MANUAL ENTRY (I-CUP)
POC Amphetamine UR: NOT DETECTED
POC Buprenorphine (BUP): NOT DETECTED
POC Methadone UR: NOT DETECTED
POC Morphine: NOT DETECTED
POC Oxazepam (BZO): NOT DETECTED

## 2020-10-24 LAB — POC SARS CORONAVIRUS 2 AG: SARS Coronavirus 2 Ag: NEGATIVE

## 2020-10-24 LAB — RESP PANEL BY RT-PCR (FLU A&B, COVID) ARPGX2
Influenza A by PCR: NEGATIVE
Influenza B by PCR: NEGATIVE
SARS Coronavirus 2 by RT PCR: NEGATIVE

## 2020-10-24 LAB — HEMOGLOBIN A1C
Hgb A1c MFr Bld: 4.3 % — ABNORMAL LOW (ref 4.8–5.6)
Mean Plasma Glucose: 76.71 mg/dL

## 2020-10-24 LAB — POC SARS CORONAVIRUS 2 AG -  ED: SARS Coronavirus 2 Ag: NEGATIVE

## 2020-10-24 NOTE — ED Notes (Signed)
Pt A&O x 4, no distress noted, calm & cooperative.  Watching TV at present.  Monitoring for safety.

## 2020-10-24 NOTE — ED Notes (Signed)
Pt had no belongings on admission

## 2020-10-24 NOTE — ED Provider Notes (Signed)
Behavioral Health Admission H&P [facility] & OBS)  Date: 10/24/20 Patient Name: Cody Roberts MRN: [id number] Chief Complaint:  Chief Complaint  Patient presents with  . Urgent emergent evaluation   Chief Complaint/Presenting Problem: NA  Diagnoses:  Final diagnoses:  None   HPI:  Cody Roberts is a 25 year old male who presented voluntarily to [facility] with his mother for evaluation of increased paranoia, ideas of reference, and delusions.   Patient has past documented psychiatric history of severe MDD without psychotic features and self-reported dysthymia. Patient states he has taken Fluoxetine for 2 months in 2020 but never refilled the medication. He currently lives in [place] with his grandmother; unemployed. Reports daily use of "Delta-8". Skin around nails red due to constant picking. Patient reports getting messages from the computer and radios "referencing family", command auditory hallucinations "being encouraged to commit suicide", and visual hallucinations of "flashes like a camera". Patient appears paranoid and constantly looking around. Patient denies any homicidal ideations; does not appear to be responding to any external/internal stimuli at this time. Patient states he is unable to contract for safety at this time; states he doesn't feel safe.   Based on patient presentation and symptoms; patient to remain overnight for further observation, stabilization, and treatment.   PHQ 2-9:  Flowsheet Row Office Visit from 11/05/2018 in [facility] Office Visit from 10/07/2018 in [facility] Office Visit from 09/05/2018 in [facility]  Thoughts that you would be better off dead, or of hurting yourself in some way More than half the days Several days More than half the days  PHQ-9 Total Score 22 22 23       Flowsheet Row ED from 10/24/2020 in [facility]   C-SSRS RISK CATEGORY No Risk       Total Time spent with patient: 15 minutes  Musculoskeletal  Strength & Muscle Tone: within normal limits Gait & Station: normal Patient leans: N/A  Psychiatric Specialty Exam  Presentation General Appearance: Disheveled; Other (comment) (older than stated age)  Eye Contact:Fair  Speech:Clear and Coherent; Normal Rate  Speech Volume:Normal  Handedness:Left  Mood and Affect  Mood:Anxious  Affect:Non-Congruent  Thought Process  Thought Processes:-- (paranoid)  Descriptions of Associations:Loose  Orientation:Full (Time, Place and Person)  Thought Content:Delusions; Illogical; Rumination; Perseveration; Paranoid Ideation   Hallucinations:Hallucinations: Auditory; Command; Visual Description of Command Hallucinations: suicidal Description of Auditory Hallucinations: intrusive, command suicidal Description of Visual Hallucinations: "flashes like a camera"  Ideas of Reference:Paranoia; Delusions; Percusatory  Suicidal Thoughts:Suicidal Thoughts: Yes, Passive  Homicidal Thoughts:Homicidal Thoughts: No  Sensorium  Memory:Remote Fair; Recent Fair; Immediate Fair  Judgment:Poor  Insight:Shallow   Executive Functions  Concentration:Fair  Attention Span:Fair  Recall:Fair  Fund of Knowledge:Fair  Language:Fair   Psychomotor Activity  Psychomotor Activity:Psychomotor Activity: Other (comment) (skin picking)   Assets  Assets:Social Support; Resilience   Sleep  Sleep:Sleep: Fair   Nutritional Assessment (For OBS and [facility]BC admissions only) Has the patient had a weight loss or gain of 10 pounds or more in the last 3 months?: No Has the patient had a decrease in food intake/or appetite?: Yes Does the patient have dental problems?: No Does the patient have eating habits or behaviors that may be indicators of an eating disorder including binging or inducing vomiting?: No Has the patient recently lost weight without  trying?: No Has the patient been eating poorly because of a decreased appetite?: Yes  Malnutrition Screening Tool Score: 1    Physical Exam Psychiatric:        Attention and Perception: He perceives auditory and visual hallucinations.        Mood and Affect: Mood is anxious. Affect is flat and inappropriate.        Speech: Speech normal.        Behavior: Behavior is withdrawn. Behavior is cooperative.        Thought Content: Thought content is paranoid and delusional. Thought content includes suicidal ideation.        Cognition and Memory: Cognition and memory normal.        Judgment: Judgment is impulsive and inappropriate.    Review of Systems  Psychiatric/Behavioral: Positive for hallucinations, substance abuse and suicidal ideas. The patient is nervous/anxious.   All other systems reviewed and are negative.   Blood pressure (!) 133/92, pulse (!) 101, temperature 98.2 F (36.8 C), temperature source Tympanic, resp. rate 16, SpO2 97 %. There is no height or weight on file to calculate BMI.  Past Psychiatric History:   -dysthymia  -Depression  Is the patient at risk to self? Yes  Has the patient been a risk to self in the past 6 months? pt denies.    Has the patient been a risk to self within the distant past? pt denies  Is the patient a risk to others? pt denies  Has the patient been a risk to others in the past 6 months? pt denies  Has the patient been a risk to others within the distant past? No   Past Medical History:  Past Medical History:  Diagnosis Date  . Chest tightness 04/18/2017  . Dysthymia 04/19/2017   Referral downstairs to behavioral health for counseling, patient is advised on mental health safety precautions, can come to me if he would like to discuss meds  . Eustachian tube dysfunction, bilateral 04/19/2017   Canals clear and tympanic membranes normal, advised OTC Flonase and or second generation antihistamine Claritin/Zyrtec/Allegra or similar generic  .  Sinusitis 05/18/2016  . Vision changes 05/18/2016   No past surgical history on file.  Family History: No family history on file.  Social History:  Social History   Socioeconomic History  . Marital status: Married    Spouse name: Not on file  . Number of children: Not on file  . Years of education: Not on file  . Highest education level: Not on file  Occupational History  . Not on file  Tobacco Use  . Smoking status: Never Smoker  . Smokeless tobacco: Never Used  Vaping Use  . Vaping Use: Every day  Substance and Sexual Activity  . Alcohol use: No  . Drug use: Not on file  . Sexual activity: Not Currently  Other Topics Concern  . Not on file  Social History Narrative  . Not on file   Social Determinants of Health   Financial Resource Strain: Not on file  Food Insecurity: Not on file  Transportation Needs: Not on file  Physical Activity: Not on file  Stress: Not on file  Social Connections: Not on file  Intimate Partner Violence: Not on file    SDOH:  SDOH Screenings   Alcohol Screen: Not on file  Depression (ZWC5-8): Not on file  Financial Resource Strain: Not on file  Food Insecurity: Not on file  Housing: Not on file  Physical Activity: Not on file  Social Connections: Not on file  Stress: Not on file  Tobacco Use:  Low Risk   . Smoking Tobacco Use: Never Smoker  . Smokeless Tobacco Use: Never Used  Transportation Needs: Not on file    Last Labs:  Admission on 10/24/2020  Component Date Value Ref Range Status  . SARS Coronavirus 2 Ag 10/24/2020 Negative  Negative Final  . POC Amphetamine UR 10/24/2020 None Detected  NONE DETECTED (Cut Off Level 1000 ng/mL) Final  . POC Secobarbital (BAR) 10/24/2020 None Detected  NONE DETECTED (Cut Off Level 300 ng/mL) Final  . POC Buprenorphine (BUP) 10/24/2020 None Detected  NONE DETECTED (Cut Off Level 10 ng/mL) Final  . POC Oxazepam (BZO) 10/24/2020 None Detected  NONE DETECTED (Cut Off Level 300 ng/mL) Final  .  POC Cocaine UR 10/24/2020 None Detected  NONE DETECTED (Cut Off Level 300 ng/mL) Final  . POC Methamphetamine UR 10/24/2020 None Detected  NONE DETECTED (Cut Off Level 1000 ng/mL) Final  . POC Morphine 10/24/2020 None Detected  NONE DETECTED (Cut Off Level 300 ng/mL) Final  . POC Oxycodone UR 10/24/2020 None Detected  NONE DETECTED (Cut Off Level 100 ng/mL) Final  . POC Methadone UR 10/24/2020 None Detected  NONE DETECTED (Cut Off Level 300 ng/mL) Final  . POC Marijuana UR 10/24/2020 Positive* NONE DETECTED (Cut Off Level 50 ng/mL) Final  . SARS Coronavirus 2 Ag 10/24/2020 NEGATIVE  NEGATIVE Final   Comment: (NOTE) SARS-CoV-2 antigen NOT DETECTED.   Negative results are presumptive.  Negative results do not preclude SARS-CoV-2 infection and should not be used as the sole basis for treatment or other patient management decisions, including infection  control decisions, particularly in the presence of clinical signs and  symptoms consistent with COVID-19, or in those who have been in contact with the virus.  Negative results must be combined with clinical observations, patient history, and epidemiological information. The expected result is Negative.  Fact Sheet for Patients: https://www.jennings-kim.com/  Fact Sheet for Healthcare Providers: https://alexander-rogers.biz/  This test is not yet approved or cleared by the [place] FDA and  has been authorized for detection and/or diagnosis of SARS-CoV-2 by FDA under an Emergency Use Authorization (EUA).  This EUA will remain in effect (meaning this test can be used) for the duration of  the COV                          ID-19 declaration under Section 564(b)(1) of the Act, 21 U.S.C. section 360bbb-3(b)(1), unless the authorization is terminated or revoked sooner.     Allergies: Patient has no known allergies.  PTA Medications: (Not in a hospital admission)  Medical Decision Making  Patient to remain  overnight for further observation and stabilization. UDS pending, labs drawn to rule out organic and/or substance induced disorder. Patient to be re-evaluated.    Recommendations  Based on my evaluation the patient does not appear to have an emergency medical condition.  Loletta Parish, NP 10/24/20  6:43 PM

## 2020-10-24 NOTE — BHH Counselor (Signed)
Patient gives verbal consent for his mother, [name], to be involved in his care. Her phone number is [phone]. She requests to be contacted tomorrow with updated disposition.

## 2020-10-24 NOTE — ED Notes (Signed)
Pt has no belongings in locker. Pt mom sitting in lobby.

## 2020-10-24 NOTE — ED Notes (Signed)
Pt sleeping at present, no distress noted, monitoring for safety. 
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
## 2020-10-24 NOTE — ED Notes (Signed)
25 yo male walk-in admitted voluntarily to observation unit due to pt complaints of feeling paranoid. Pt states, "I feel like people are watching me. I'm getting messages from streaming people online. I'm getting messages that people are watching me and telling me to watch my back. I'm not feeling suicidal or homicidal. I use Delta 8 daily. I used some today". Pt cooperative throughout assessment. Oriented to unit and unit rules. Will monitor for safety.

## 2020-10-24 NOTE — BH Assessment (Signed)
Triage: Pt presents to [facility] accompanied by his mother Vinie Sill). Pt presents with paranoid delusions "I feel like the content I've been watching on the tv has been telling me to come here and get checked out and if I don't something's going to happen to me". Pt further reports "Today I thought someone came in the house". Pt believes these messages are being directly sent to him through television content. Pt reports taking Delta 8 daily with today as the last use. Pt denied SI/HI.  Determination of Need: Urgent

## 2020-10-25 DIAGNOSIS — F333 Major depressive disorder, recurrent, severe with psychotic symptoms: Secondary | ICD-10-CM | POA: Diagnosis not present

## 2020-10-25 DIAGNOSIS — F1910 Other psychoactive substance abuse, uncomplicated: Secondary | ICD-10-CM

## 2020-10-25 MED ORDER — OLANZAPINE 5 MG PO TABS: 5.0000 mg | ORAL_TABLET | Freq: Every day | ORAL | Status: DC

## 2020-10-25 MED ORDER — FLUOXETINE HCL 20 MG PO CAPS
20.0000 mg | ORAL_CAPSULE | Freq: Every day | ORAL | Status: DC
  Administered 2020-10-25: 20 mg via ORAL
  Filled 2020-10-25: qty 1

## 2020-10-25 MED ORDER — OLANZAPINE 5 MG PO TABS: 5.0000 mg | ORAL_TABLET | Freq: Every day | ORAL | 1 refills | Status: DC

## 2020-10-25 MED ORDER — FLUOXETINE HCL 20 MG PO CAPS: 20.0000 mg | ORAL_CAPSULE | Freq: Every day | ORAL | 1 refills | Status: DC

## 2020-10-25 MED ORDER — OLANZAPINE 5 MG PO TABS
5.0000 mg | ORAL_TABLET | Freq: Once | ORAL | Status: AC
  Administered 2020-10-25: 5 mg via ORAL
  Filled 2020-10-25: qty 1

## 2020-10-25 NOTE — ED Notes (Signed)
Pt sleeping at present, no distress noted, monitoring for safety. 

## 2020-10-25 NOTE — ED Provider Notes (Signed)
FBC/OBS ASAP Discharge Summary  Date and Time: 10/25/2020 3:31 PM  Name: Cody Roberts  MRN:  [id number]   Discharge Diagnoses:  Final diagnoses:  Severe episode of recurrent major depressive disorder, with psychotic features (HCC)  Substance abuse (HCC)    Subjective: At first this morning patient reported that he was still feeling paranoid and slept partially last night.  Patient then agreed to take Zyprexa 5 mg p.o. once and Prozac 20 mg p.o. daily.  Patient slept for approximately 6 hours and when he awoke he stated that he was feeling much better.  Patient stated he was feeling calmer and denied having any paranoia at the time.  Patient denied any suicidal or homicidal ideations and denied any hallucinations.  Patient reports that if he can continue this medication that he does feel safe to discharge home.  He stated he was concerned that the medication would not have this could have an effect on him, but now he feels like this is a medication that would really work for him. Patient's mother was contacted for collateral information and safety planning.  She reports that she has no problems with the patient discharging home and she feels safe with him leaving.  She does report that she has concerns with him not continuing his medications like he has done in the past.  He was encouraged for patient to follow-up with outpatient therapy and psychiatry and to continue his current medication prescriptions until otherwise directed by provider.  Stay Summary: Patient is a 25 year old male presented voluntarily to the [facility] accompanied by his mother for evaluation of paranoia, ideas of reference and delusions.  He reported the patient has a history of MDD with psychotic features and was on Prozac for 2 months in 2020 but never refilled his medications.  Patient was also reported daily use of delta-8.  Due to patient's paranoia and reported auditory hallucinations telling him to commit suicide as well as  visual hallucinations that appear to be flashes like a camera he was admitted to the continuous observation for overnight assessment.  This morning the patient at first reported still feeling paranoid so patient was started on Zyprexa 5 mg p.o. once and Prozac 20 mg p.o. daily.  Patient slept for approximately 6 hours and when he awoke the patient stated that he was feeling much better and calmer.  Patient reported feeling that the medication would be beneficial to him and felt that if he continue the medication he felt safe to return home.  Patient was prescribed Zyprexa 5 mg p.o. nightly and Prozac 20 mg p.o. daily.  Patient was provided with a prescription for 30 days with 1 refill.  Medications were E prescribed to pharmacy of choice.  Safety plan was established with patient's mother and she reports that she will pick the patient up from this facility.  Patient is provided with a list of outpatient resources to accommodate his [employer]  Total Time spent with patient: 30 minutes  Past Psychiatric History: MDD, Delta 8 use Past Medical History:  Past Medical History:  Diagnosis Date  . Chest tightness 04/18/2017  . Dysthymia 04/19/2017   Referral downstairs to behavioral health for counseling, patient is advised on mental health safety precautions, can come to me if he would like to discuss meds  . Eustachian tube dysfunction, bilateral 04/19/2017   Canals clear and tympanic membranes normal, advised OTC Flonase and or second generation antihistamine Claritin/Zyrtec/Allegra or similar generic  . Sinusitis  05/18/2016  . Vision changes 05/18/2016   No past surgical history on file. Family History: No family history on file. Family Psychiatric History: None reported Social History:  Social History   Substance and Sexual Activity  Alcohol Use No     Social History   Substance and Sexual Activity  Drug Use Not on file    Social History   Socioeconomic History  .  Marital status: Married    Spouse name: Not on file  . Number of children: Not on file  . Years of education: Not on file  . Highest education level: Not on file  Occupational History  . Not on file  Tobacco Use  . Smoking status: Never Smoker  . Smokeless tobacco: Never Used  Vaping Use  . Vaping Use: Every day  Substance and Sexual Activity  . Alcohol use: No  . Drug use: Not on file  . Sexual activity: Not Currently  Other Topics Concern  . Not on file  Social History Narrative  . Not on file   Social Determinants of Health   Financial Resource Strain: Not on file  Food Insecurity: Not on file  Transportation Needs: Not on file  Physical Activity: Not on file  Stress: Not on file  Social Connections: Not on file   SDOH:  SDOH Screenings   Alcohol Screen: Not on file  Depression (PIR5-1): Not on file  Financial Resource Strain: Not on file  Food Insecurity: Not on file  Housing: Not on file  Physical Activity: Not on file  Social Connections: Not on file  Stress: Not on file  Tobacco Use: Low Risk   . Smoking Tobacco Use: Never Smoker  . Smokeless Tobacco Use: Never Used  Transportation Needs: Not on file    Has this patient used any form of tobacco in the last 30 days? (Cigarettes, Smokeless Tobacco, Cigars, and/or Pipes) A prescription for an FDA-approved tobacco cessation medication was offered at discharge and the patient refused  Current Medications:  Current Facility-Administered Medications  Medication Dose Route Frequency Provider Last Rate Last Admin  . FLUoxetine (PROZAC) capsule 20 mg  20 mg Oral Daily Money, Gerlene Burdock, FNP   20 mg at 10/25/20 8841  . OLANZapine (ZYPREXA) tablet 5 mg  5 mg Oral QHS Money, Gerlene Burdock, FNP       Current Outpatient Medications  Medication Sig Dispense Refill  . [START ON 10/26/2020] FLUoxetine (PROZAC) 20 MG capsule Take 1 capsule (20 mg total) by mouth daily. 30 capsule 1  . OLANZapine (ZYPREXA) 5 MG tablet Take 1  tablet (5 mg total) by mouth at bedtime. 30 tablet 1    PTA Medications: (Not in a hospital admission)   Musculoskeletal  Strength & Muscle Tone: within normal limits Gait & Station: normal Patient leans: N/A  Psychiatric Specialty Exam  Presentation  General Appearance: Appropriate for Environment; Casual  Eye Contact:Good  Speech:Clear and Coherent; Normal Rate  Speech Volume:Normal  Handedness:Right   Mood and Affect  Mood:Euthymic  Affect:Congruent; Appropriate   Thought Process  Thought Processes:Coherent  Descriptions of Associations:Intact  Orientation:Full (Time, Place and Person)  Thought Content:WDL  Hallucinations:Hallucinations: None Description of Command Hallucinations: suicidal Description of Auditory Hallucinations: intrusive, command suicidal Description of Visual Hallucinations: "flashes like a camera"  Ideas of Reference:None  Suicidal Thoughts:Suicidal Thoughts: No  Homicidal Thoughts:Homicidal Thoughts: No   Sensorium  Memory:Immediate Good; Recent Good; Remote Good  Judgment:Fair  Insight:Fair   Executive Functions  Concentration:Good  Attention Span:Good  Recall:Good  Fund of Knowledge:Good  Language:Good   Psychomotor Activity  Psychomotor Activity:Psychomotor Activity: Normal   Assets  Assets:Communication Skills; Desire for Improvement; Financial Resources/Insurance; Housing; Physical Health; Social Support; Transportation   Sleep  Sleep:Sleep: Good   Nutritional Assessment (For OBS and FBC admissions only) Has the patient had a weight loss or gain of 10 pounds or more in the last 3 months?: No Has the patient had a decrease in food intake/or appetite?: Yes Does the patient have dental problems?: No Does the patient have eating habits or behaviors that may be indicators of an eating disorder including binging or inducing vomiting?: No Has the patient recently lost weight without trying?: No Has the  patient been eating poorly because of a decreased appetite?: Yes Malnutrition Screening Tool Score: 1    Physical Exam  Physical Exam ROS Blood pressure 138/87, pulse 69, temperature 98.6 F (37 C), temperature source Oral, resp. rate 16, SpO2 97 %. There is no height or weight on file to calculate BMI.  Demographic Factors:  Male and Caucasian  Loss Factors: NA  Historical Factors: NA  Risk Reduction Factors:   Sense of responsibility to family, Living with another person, especially a relative, Positive social support and Positive therapeutic relationship  Continued Clinical Symptoms:  Alcohol/Substance Abuse/Dependencies Previous Psychiatric Diagnoses and Treatments  Cognitive Features That Contribute To Risk:  None    Suicide Risk:  Minimal: No identifiable suicidal ideation.  Patients presenting with no risk factors but with morbid ruminations; may be classified as minimal risk based on the severity of the depressive symptoms  Plan Of Care/Follow-up recommendations:  Continue activity as tolerated. Continue diet as recommended by your PCP. Ensure to keep all appointments with outpatient providers.  Disposition: Discharge home with mother  Maryfrances Bunnell, FNP 10/25/2020, 3:31 PM

## 2020-10-25 NOTE — ED Notes (Signed)
Escorted to front lobby to meet mother. Ambulated per self. No s/s pain, discomfort, or acute distress. No SI, HI, or AVH. A&O x4. Stable at time of d/c.

## 2020-10-25 NOTE — Progress Notes (Addendum)
Pt is currently awake. Pt received his medications with no incidents. Pt appears to be responding to internal stimuli as evidence by pt mumbling to self and looking around suspiciously. Pt denies  pain, SI, HI and AVH at this time. Pt's safety is maintained.

## 2020-10-25 NOTE — ED Notes (Signed)
Reviewed AVS with pt. Pt verbalized understanding of all instructions. Denies SI/HI/AVS upon discharge. Waiting on mother to come transport pt to home. Safety maintained.

## 2020-10-25 NOTE — Progress Notes (Signed)
Pt is sleeping. Resps are even and unlabored. No acute disress noted. Staff will monitor for pt's safety.

## 2020-10-25 NOTE — Progress Notes (Signed)
Pt is sleeping. Resps are even and unlabored. No signs of acute distress noted. Pt's safety is maintained.

## 2020-10-25 NOTE — Discharge Instructions (Addendum)

## 2020-10-26 ENCOUNTER — Ambulatory Visit (HOSPITAL_COMMUNITY)
Admission: EM | Admit: 2020-10-26 | Discharge: 2020-10-27 | Disposition: A | Discharge status: Other Acute Inpatient Hospital | Payer: BC Managed Care – PPO | Attending: Student | Admitting: Student

## 2020-10-26 ENCOUNTER — Other Ambulatory Visit: Payer: Self-pay

## 2020-10-26 DIAGNOSIS — S1181XA Laceration without foreign body of other specified part of neck, initial encounter: Secondary | ICD-10-CM | POA: Insufficient documentation

## 2020-10-26 DIAGNOSIS — Z79899 Other long term (current) drug therapy: Secondary | ICD-10-CM | POA: Insufficient documentation

## 2020-10-26 DIAGNOSIS — Z9151 Personal history of suicidal behavior: Secondary | ICD-10-CM | POA: Diagnosis not present

## 2020-10-26 DIAGNOSIS — Z20822 Contact with and (suspected) exposure to covid-19: Secondary | ICD-10-CM | POA: Diagnosis not present

## 2020-10-26 DIAGNOSIS — F341 Dysthymic disorder: Secondary | ICD-10-CM | POA: Diagnosis not present

## 2020-10-26 DIAGNOSIS — R45851 Suicidal ideations: Secondary | ICD-10-CM | POA: Diagnosis not present

## 2020-10-26 DIAGNOSIS — F333 Major depressive disorder, recurrent, severe with psychotic symptoms: Secondary | ICD-10-CM

## 2020-10-26 DIAGNOSIS — F1210 Cannabis abuse, uncomplicated: Secondary | ICD-10-CM

## 2020-10-26 LAB — POC SARS CORONAVIRUS 2 AG: SARS Coronavirus 2 Ag: NEGATIVE

## 2020-10-26 LAB — POC SARS CORONAVIRUS 2 AG -  ED: SARS Coronavirus 2 Ag: NEGATIVE

## 2020-10-26 MED ORDER — ALUM & MAG HYDROXIDE-SIMETH 200-200-20 MG/5ML PO SUSP: 30.0000 mL | ORAL | Status: DC | PRN

## 2020-10-26 MED ORDER — OLANZAPINE 5 MG PO TABS
5.0000 mg | ORAL_TABLET | Freq: Every day | ORAL | Status: DC
  Administered 2020-10-26: 5 mg via ORAL
  Filled 2020-10-26: qty 1

## 2020-10-26 MED ORDER — FLUOXETINE HCL 20 MG PO CAPS: 20.0000 mg | ORAL_CAPSULE | Freq: Every day | ORAL | Status: DC

## 2020-10-26 MED ORDER — HYDROXYZINE HCL 25 MG PO TABS: 25.0000 mg | ORAL_TABLET | Freq: Three times a day (TID) | ORAL | Status: DC | PRN

## 2020-10-26 MED ORDER — ACETAMINOPHEN 325 MG PO TABS: 650.0000 mg | ORAL_TABLET | Freq: Four times a day (QID) | ORAL | Status: DC | PRN

## 2020-10-26 MED ORDER — TRAZODONE HCL 50 MG PO TABS: 50.0000 mg | ORAL_TABLET | Freq: Every evening | ORAL | Status: DC | PRN

## 2020-10-26 MED ORDER — MAGNESIUM HYDROXIDE 400 MG/5ML PO SUSP: 30.0000 mL | Freq: Every day | ORAL | Status: DC | PRN

## 2020-10-26 NOTE — ED Provider Notes (Incomplete)
Behavioral Health Admission H&P [facility] & OBS)  Date: 10/27/20 Patient Name: Cody Roberts MRN: [id number] Chief Complaint:  Chief Complaint  Patient presents with  . Paranoid    Psychosis   Chief Complaint/Presenting Problem: Paranoia with SI  Diagnoses:  Final diagnoses:  Severe episode of recurrent major depressive disorder, with psychotic features (HCC)  Cannabis abuse    HPI: Cody Roberts is a 25 year old male with history of MDD and dysthymia who presents to the behavioral health urgent care as a voluntary walk-in for the second time this week. Patient accompanied to the [facility] by his mother and grandmother. Mother and grandmother in [facility] lobby and not present during patient's assessment.    Per chart review:  Patient presented to the behavioral health urgent care with his mother on 10/24/20 for paranoia, hallucinations and delusions.  Patient was admitted to [facility] continuous observation/assessment for further stabilization and treatment overnight.  Patient took one-time dose of Zyprexa 5 mg p.o. and began Prozac 20 mg p.o. daily during this [facility] stay upon reevaluation of the patient on October 25, 2020, patient's symptoms and presentation had significantly improved.  At that time, patient was deemed appropriate for discharge from [facility]. Upon [facility] discharge, prescriptions were sent to the patient's pharmacy for Zyprexa 5 mg p.o. nightly and Prozac 20 mg p.o. daily.  Patient was also provided with outpatient behavioral health resources that were appropriate for his health insurance plan upon [facility] discharge.  Patient presents back to [facility] this evening (10/26/20) for SI with a plan in addition to similar presentation as his prior presentation to [facility] on 10/24/20.  When patient is asked why he decided to return to the [facility], patient states the following statement that it is difficult to follow: "things are happening and I am not able to explain them.  Videos are planning at times when specific things are  being said.  When you see the red light in the video pauses at that moment, I then went to both doors and there was a red light".  Patient endorses active SI on exam with a plan to slit/cut his throat with a knife and patient states that he has been "seeing images of me cutting my throat with a knife" over the past few days.  Patient also reports that he has been hearing a song in his head that "encourages me to commit suicide".  When asked about past suicide attempts, states that "in 2017 I would sometimes drive as fast as possible and then hit the brakes at the last second".  When patient is asked again if he has ever attempted suicide in the past, patient states that he has not attempted suicide in the past.  Patient denies any history of intentionally cutting or burning himself.  Patient denies HI on exam.    Patient endorses AVH for the past 6 months to 1 year.  Patient states that he has been hearing his sister's voices speaking to him recently but he noticed that his sister is not actually there because the house is empty when he hears these voices.  Patient also reports that he has been "seeing objects move past the windows" at home as well as seeing "a flash outside the window" at home.  The patient is asked if he is experiencing any additional visual hallucinations, he states that he has seen "food disappearing and water levels" at home.  When patient is asked to explain these "water levels" more in depth, patient states that he will remove  a water bottle from a certain location in his home, empty the water bottle, throw the water bottle away, and then he will see a new full/unopened bottled water in the same location that he just removed the previous water bottle from.  Patient denies tactile, olfactory, or gustatory hallucinations.  Patient reports being very paranoid "my whole life" but he states that his paranoia has increased over the past 6 months to 1 year.  Patient states that he thinks that  someone is out to get him and that someone is trying to kill him.  Patient endorses delusions of reference, persecutory delusions, and control delusions for the past 6 months to 1 year.  He states that he thinks that people are trying to control his thoughts and actions.  He reports that he "spends too much time on the Internet" and that he will spend all day watching streams on twitch.  Patient states that he thinks that every streamer that he watches on twitch is "speaking to me indirectly" and he states that "all of the streamers I watch are all on the same page and are using the same little jokes to try to get me to leave".  He also states that the twitch streamers he watches online will tell him to do things like "go make food" and that this will make him feel like he has to do what they tell him to do.  Patient also states that when he was watching the news earlier, the people on the news "made it feel like they were talking to me".  Patient denies grandiose delusions, delusions of doubles, or delusions of erotomania.  When patient is asked about jealousy delusions, patient states that he experiences jealousy delusions sometimes and states that he "has trust issues with my family".  Patient states that he only bathes about 1-2 times per month and he states that he often does not brush his teeth.  Patient reports that he has preferred to be alone and socially isolate himself for the past 6 months.  Patient reports that he has been vaping 1 g of delta 8 THC daily for the past year and he does report that he is unsure if the delta 8 use has worsened his mental health symptoms. Patient states he has not drank alcohol since 2019.  He reports that he used to date nicotine but he states that he quit doing this 2 to 3 years ago.  Patient denies any additional drug/substance use.  Patient states that his sleep is fair and that he sleeps about 12 to 15 hours/day.  Patient states that he will usually sleep through the  night, but he does endorse experiencing sleepless nights due to the feeling of a lack of need for sleep occasionally.  Patient states he will often sleep during the day as well.  He endorses anhedonia, as well as feelings of guilt, hopelessness, and worthlessness.  The patient is asked about recent energy changes, he states that his energy is good from the morning to 12 PM, but after 12 PM, he states that his energy declines.  Patient reports that his concentration is fair and denies any recent changes in his concentration.  Patient reports that his appetite has been declining recently and he believes that he has lost at least 10 pounds over the past 3 months.  Patient reports being easily distracted often.  He denies engaging in risky behavior such as driving under the influence or engaging in excessive shopping sprees.  He  endorses flight of ideas, as well as feeling pressured to continue talking to hold a conversation at times.  Patient states that he is not currently seeing a psychiatrist or therapist.  Patient denies ever seeing a psychiatrist in the past.  He does report that he saw a therapist for CBT (patient unable to remember the name of his therapist, but states that the therapist is located on [facility]) from March/April 2021 until August 2021, but he states that his therapy was discontinued due to COVID and he has not seen a therapist since.  Patient also reports that he was previously prescribed Prozac by his primary care provider for depression and that he took the Prozac from January 2020 to March 2020.  Patient states that he stopped taking the Prozac in March 2020 because the dose kept being increased and it was not helping with his mental health symptoms.  Patient states that he has been taking the Zyprexa 5 mg p.o. nightly and Prozac 20 mg p.o. daily that he was prescribed from his March 6-7, 2022 [facility] stay.  He states that he took his Prozac this morning but he has not taken his  evening dose of Zyprexa yet.  Patient denies any additional history of taking psychotropic medications.  He denies any history of receiving inpatient psychiatric treatment in the past.  Patient states that he lives in [place] with his grandmother and he states that his grandmother is "out of the house for weeks at a time because her boyfriend lives at the beach".  Patient denies any access to guns or weapons.  Patient states that he has been unemployed since 2019.  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  He states his highest level of education is completing some college.  On exam, patient is sitting in a chair, appearing paranoid and socially withdrawn, but in no acute distress.  His stated mood is "worried" with noncongruent, flat affect.  He is alert and oriented x4, cooperative, and answers all questions appropriately.  Patient does not appear to be responding to internal or external stimuli at this time on exam.  With patient's consent, information was obtained by myself and Duard Brady, LMFT speaking with the patient's mother Vinie Sill: [phone]) and the patient's grandmother Lendell Caprice: [phone]) in the [facility] lobby.  This obtain collateral information as shown below, as written and Wells Guiles 10/26/20 BH Assessment/CCA note:   "Pt's provided verbal consent for clinician and PA to speak with his mother and grandmother. Pt's mother shared that pt's behaviors started 6 - 8 years ago; she states it started after he cheated on a girlfriend and they broke up but then, at age 54, he attempted to go into the military but was denied due to medical concerns. Pt's mother shares pt "went from being a full-time student and working two jobs to not being able to leave the home." Pt's grandmother states she and pt moved into their home in August 2021 and that, since that time, he has only showered twice. Pt's mother and  grandmother share pt won't leave the home and that, when people come by the home, he rarely comes out to see them. Pt's mother acknowledged pt does walk the dog around the yard but that he won't leave the yard; she also shared pt has lost a bunch of weight and only eats approximately 1 meal/day.  Pt's mother shares pt has lost 3 relatives  in the last year; she shares pt saw his great-grandmother die (she was sick) and that his other great-grandmother, whom he was very close to, died in November 16, 2019. Pt's paternal grandmother died in 2020-08-17.  Pt's mother shares pt told her, "I'm having homicidal thoughts." She states pt believes someone has been in the home, has pointed a laser light at him, someone is in the yard spying on him, and that someone comes into the home and opens bottles of water and eats Oreos. She shares that, while his grandmother was at the beach, he sealed a partial cup of coffee and a piece of cake in a Ziplock bag due to the belief that it was poisoned.  Pt's grandmother shares that for 1/2 of January and all of February pt has been having rages or anger every night with his grandmother. She shares pt will come into the kitchen for something and will begin drilling her on the subjects of racism, homosexuality, and religion. She states that pt will yell at her until she "answers correctly," which is saying what he wants her to say. She shares pt has broken bowls of hers by slamming them down and will grab the refrigerator and begin to shake it, stating that he won't stop until she correctly answers him. 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  Patient's mother states that the patient was followed by [facility] in the past for this and had "multiple scans" in the past.  Patient's mother states that due to the multiple  scans showing no growth of the cyst, the patient was cleared at that time and has not been followed for the cyst for several years.  PHQ 2-9:  Flowsheet Row Office Visit from 11/05/2018 in [facility] Office Visit from 10/07/2018 in [facility] Office Visit from 09/05/2018 in [facility]  Thoughts that you would be better off dead, or of hurting yourself in some way More than half the days Several days More than half the days  PHQ-9 Total Score Flowsheet Row ED from 10/26/2020 in [facility] ED from 10/24/2020 in [facility]  C-SSRS RISK CATEGORY Low Risk No Risk       Total Time spent with patient: 30 minutes  Musculoskeletal  Strength & Muscle Tone: within normal limits Gait & Station: normal Patient leans: N/A  Psychiatric Specialty Exam  Presentation General Appearance: Bizarre; Disheveled  Eye Contact:Fleeting  Speech:Clear and Coherent; Normal Rate  Speech Volume:Normal  Handedness:Right   Mood and Affect  Mood:-- (Worried)  Affect:Non-Congruent; Flat   Thought Process  Thought Processes:Disorganized  Descriptions of Associations:Circumstantial  Orientation:Full (Time, Place and Person)  Thought Content:Delusions; Paranoid Ideation; Scattered   Hallucinations:Hallucinations: Auditory; Visual  Ideas of Reference:Paranoia; Delusions  Suicidal Thoughts:Suicidal Thoughts: Yes, Active SI Active Intent and/or Plan: With Intent; With Plan; With Means to Carry Out; With Access to Means  Homicidal Thoughts:Homicidal Thoughts: No   Sensorium  Memory:Immediate Fair; Recent Fair; Remote Fair  Judgment:Fair  Insight:Fair   Executive Functions  Concentration:Fair  Attention Span:Fair  Recall:Fair  Fund of Knowledge:Fair  Language:Fair   Psychomotor Activity  Psychomotor  Activity:Psychomotor Activity: Normal   Assets  Assets:Communication Skills; Desire for Improvement; Financial Resources/Insurance; Housing; Leisure Time; Physical Health; Social Support; Transportation   Sleep  Sleep:Sleep: Fair Number of Hours of Sleep: 12   Nutritional Assessment (For OBS and FBC admissions only) Has the patient had a weight loss or gain of 10 pounds or more in the last 3 months?: Yes Has the patient had a decrease in food intake/or appetite?: No Does the patient have dental problems?: Yes Does the patient have eating habits or behaviors that may be indicators of an eating disorder including binging or inducing vomiting?: No Has the patient recently lost weight without trying?: Yes, 2-13 lbs. Has the patient been eating poorly because of a decreased appetite?: No Malnutrition Screening Tool Score: 1    Physical Exam Vitals reviewed.  Constitutional:      General: He is not in acute distress.    Appearance: He is not ill-appearing, toxic-appearing or diaphoretic.  HENT:     Head: Atraumatic.     Right Ear: External ear normal.     Left Ear: External ear normal.  Cardiovascular:     Rate and Rhythm: Normal rate.  Pulmonary:     Effort: Pulmonary effort is normal. No respiratory distress.  Musculoskeletal:        General: Normal range of motion.     Cervical back: Normal range of motion.  Neurological:     Mental Status: He is alert and oriented to person, place, and time.  Psychiatric:        Attention and Perception: He perceives auditory and visual hallucinations.        Speech: Speech normal.        Behavior: Behavior is not agitated, slowed, aggressive, hyperactive or combative. Behavior is cooperative.        Thought Content: Thought content is paranoid and delusional. Thought content includes suicidal ideation. Thought content does not include homicidal ideation. Thought content includes suicidal plan.     Comments: Patient stated mood is worried  with noncongruent, flat affect.  Patient appears to be socially withdrawn.  Patient endorses active SI on exam with a plan to slit/cut his throat with a knife and patient states that he has been "seeing images of me cutting my throat with a knife" over the past few days.  Patient also reports that he has been hearing a song in his head that "encourages me to commit suicide". Patient endorses AVH for the past 6 months to 1 year.  Patient states that he has been hearing his sister's voices speaking to him recently but he noticed that his sister is not actually there because the house is empty when he hears these voices.  Patient also reports that he has been "seeing objects move past the windows" at home as well as seeing "a flash outside the window" at home.  The patient is asked if he is experiencing any additional visual hallucinations, he states that he has seen "food disappearing and water levels" at home.  When patient is asked to explain these "water levels" more in depth, patient states that he will remove a water bottle from a certain location in his home, empty the water bottle, throw the water bottle away, and then he will see a new full/unopened bottled water in the same location that he just removed the previous water bottle from.  Patient denies tactile, olfactory, or gustatory hallucinations.  Patient reports being very paranoid "my whole life" but he states that his paranoia has increased over the past 6  months to 1 year.  Patient states that he thinks that someone is out to get him and that someone is trying to kill him.  Patient endorses delusions of reference, persecutory delusions, and control delusions for the past 6 months to 1 year.  He states that he thinks that people are trying to control his thoughts and actions.  He reports that he "spends too much time on the Internet" and that he will spend all day watching streams on twitch.  Patient states that he thinks that every streamer that he  watches on twitch is "speaking to me indirectly" and he states that "all of the streamers I watch are all on the same page and are using the same little jokes to try to get me to leave".  He also states that the twitch streamers he watches online will tell him to do things like "go make food" and that this will make him feel like he has to do what they tell him to do.  Patient also states that when he was watching the news earlier, the people on the news "made it feel like they were talking to me".  Patient denies grandiose delusions, delusions of doubles, or delusions of erotomania.  When patient is asked about jealousy delusions, patient states that he experiences jealousy delusions sometimes and states that he "has trust issues with my family".  Judgment fair and insight fair.    Review of Systems  Constitutional: Positive for malaise/fatigue and weight loss. Negative for chills, diaphoresis and fever.  HENT: Negative for congestion.   Respiratory: Negative for cough and shortness of breath.   Cardiovascular: Negative for chest pain and palpitations.  Gastrointestinal: Negative for abdominal pain, constipation, diarrhea, nausea and vomiting.  Musculoskeletal: Negative for joint pain and myalgias.  Neurological: Negative for dizziness and headaches.  Psychiatric/Behavioral: Positive for depression, hallucinations, substance abuse and suicidal ideas. Negative for memory loss. The patient is nervous/anxious.   All other systems reviewed and are negative.   Vitals: Blood pressure (!) 138/94, pulse 79, temperature 98.5 F (36.9 C), resp. rate 18, SpO2 98 %. There is no height or weight on file to calculate BMI.  Past Psychiatric History: Per chart review: MDD without psychotic features, Dysthymia   Is the patient at risk to self? Yes  Has the patient been a risk to self in the past 6 months? No .    Has the patient been a risk to self within the distant past? No   Is the patient a risk to  others? Yes   Has the patient been a risk to others in the past 6 months? Yes   Has the patient been a risk to others within the distant past? No   Past Medical History:  Past Medical History:  Diagnosis Date  . Chest tightness 04/18/2017  . Dysthymia 04/19/2017   Referral downstairs to behavioral health for counseling, patient is advised on mental health safety precautions, can come to me if he would like to discuss meds  . Eustachian tube dysfunction, bilateral 04/19/2017   Canals clear and tympanic membranes normal, advised OTC Flonase and or second generation antihistamine Claritin/Zyrtec/Allegra or similar generic  . Sinusitis 05/18/2016  . Vision changes 05/18/2016   History reviewed. No pertinent surgical history.  Family History: History reviewed. No pertinent family history.  Social History:  Social History   Socioeconomic History  . Marital status: Married    Spouse name: Not on file  . Number of children: Not on file  .  Years of education: Not on file  . Highest education level: Not on file  Occupational History  . Not on file  Tobacco Use  . Smoking status: Never Smoker  . Smokeless tobacco: Never Used  Vaping Use  . Vaping Use: Every day  Substance and Sexual Activity  . Alcohol use: No  . Drug use: Not on file  . Sexual activity: Not Currently  Other Topics Concern  . Not on file  Social History Narrative  . Not on file   Social Determinants of Health   Financial Resource Strain: Not on file  Food Insecurity: Not on file  Transportation Needs: Not on file  Physical Activity: Not on file  Stress: Not on file  Social Connections: Not on file  Intimate Partner Violence: Not on file    SDOH:  SDOH Screenings   Alcohol Screen: Not on file  Depression (ZOX0-9): Not on file  Financial Resource Strain: Not on file  Food Insecurity: Not on file  Housing: Not on file  Physical Activity: Not on file  Social Connections: Not on file  Stress: Not on file   Tobacco Use: Low Risk   . Smoking Tobacco Use: Never Smoker  . Smokeless Tobacco Use: Never Used  Transportation Needs: Not on file    Last Labs:  Admission on 10/26/2020  Component Date Value Ref Range Status  . SARS Coronavirus 2 by RT PCR 10/26/2020 NEGATIVE  NEGATIVE Final   Comment: (NOTE) SARS-CoV-2 target nucleic acids are NOT DETECTED.  The SARS-CoV-2 RNA is generally detectable in upper respiratory specimens during the acute phase of infection. The lowest concentration of SARS-CoV-2 viral copies this assay can detect is 138 copies/mL. A negative result does not preclude SARS-Cov-2 infection and should not be used as the sole basis for treatment or other patient management decisions. A negative result may occur with  improper specimen collection/handling, submission of specimen other than nasopharyngeal swab, presence of viral mutation(s) within the areas targeted by this assay, and inadequate number of viral copies(<138 copies/mL). A negative result must be combined with clinical observations, patient history, and epidemiological information. The expected result is Negative.  Fact Sheet for Patients:  BloggerCourse.com  Fact Sheet for Healthcare Providers:  SeriousBroker.it  This test is no                          t yet approved or cleared by the [place] FDA and  has been authorized for detection and/or diagnosis of SARS-CoV-2 by FDA under an Emergency Use Authorization (EUA). This EUA will remain  in effect (meaning this test can be used) for the duration of the COVID-19 declaration under Section 564(b)(1) of the Act, 21 U.S.C.section 360bbb-3(b)(1), unless the authorization is terminated  or revoked sooner.      . Influenza A by PCR 10/26/2020 NEGATIVE  NEGATIVE Final  . Influenza B by PCR 10/26/2020 NEGATIVE  NEGATIVE Final   Comment: (NOTE) The Xpert Xpress SARS-CoV-2/FLU/RSV plus assay is intended as  an aid in the diagnosis of influenza from Nasopharyngeal swab specimens and should not be used as a sole basis for treatment. Nasal washings and aspirates are unacceptable for Xpert Xpress SARS-CoV-2/FLU/RSV testing.  Fact Sheet for Patients: BloggerCourse.com  Fact Sheet for Healthcare Providers: SeriousBroker.it  This test is not yet approved or cleared by the [place] FDA and has been authorized for detection and/or diagnosis of SARS-CoV-2 by FDA under an Emergency Use Authorization (EUA). This EUA  will remain in effect (meaning this test can be used) for the duration of the COVID-19 declaration under Section 564(b)(1) of the Act, 21 U.S.C. section 360bbb-3(b)(1), unless the authorization is terminated or revoked.  Performed at [facility], [address] [place], [place] [address]   . SARS Coronavirus 2 Ag 10/26/2020 Negative  Negative Preliminary  . SARS Coronavirus 2 Ag 10/26/2020 NEGATIVE  NEGATIVE Final   Comment: (NOTE) SARS-CoV-2 antigen NOT DETECTED.   Negative results are presumptive.  Negative results do not preclude SARS-CoV-2 infection and should not be used as the sole basis for treatment or other patient management decisions, including infection  control decisions, particularly in the presence of clinical signs and  symptoms consistent with COVID-19, or in those who have been in contact with the virus.  Negative results must be combined with clinical observations, patient history, and epidemiological information. The expected result is Negative.  Fact Sheet for Patients: https://www.jennings-kim.com/  Fact Sheet for Healthcare Providers: https://alexander-rogers.biz/  This test is not yet approved or cleared by the [place] FDA and  has been authorized for detection and/or diagnosis of SARS-CoV-2 by FDA under an Emergency Use Authorization (EUA).  This EUA  will remain in effect (meaning this test can be used) for the duration of  the COV                          ID-19 declaration under Section 564(b)(1) of the Act, 21 U.S.C. section 360bbb-3(b)(1), unless the authorization is terminated or revoked sooner.    Admission on 10/24/2020, Discharged on 10/25/2020  Component Date Value Ref Range Status  . SARS Coronavirus 2 by RT PCR 10/24/2020 NEGATIVE  NEGATIVE Final   Comment: (NOTE) SARS-CoV-2 target nucleic acids are NOT DETECTED.  The SARS-CoV-2 RNA is generally detectable in upper respiratory specimens during the acute phase of infection. The lowest concentration of SARS-CoV-2 viral copies this assay can detect is 138 copies/mL. A negative result does not preclude SARS-Cov-2 infection and should not be used as the sole basis for treatment or other patient management decisions. A negative result may occur with  improper specimen collection/handling, submission of specimen other than nasopharyngeal swab, presence of viral mutation(s) within the areas targeted by this assay, and inadequate number of viral copies(<138 copies/mL). A negative result must be combined with clinical observations, patient history, and epidemiological information. The expected result is Negative.  Fact Sheet for Patients:  BloggerCourse.com  Fact Sheet for Healthcare Providers:  SeriousBroker.it  This test is no                          t yet approved or cleared by the [place] FDA and  has been authorized for detection and/or diagnosis of SARS-CoV-2 by FDA under an Emergency Use Authorization (EUA). This EUA will remain  in effect (meaning this test can be used) for the duration of the COVID-19 declaration under Section 564(b)(1) of the Act, 21 U.S.C.section 360bbb-3(b)(1), unless the authorization is terminated  or revoked sooner.      . Influenza A by PCR 10/24/2020 NEGATIVE  NEGATIVE Final  .  Influenza B by PCR 10/24/2020 NEGATIVE  NEGATIVE Final   Comment: (NOTE) The Xpert Xpress SARS-CoV-2/FLU/RSV plus assay is intended as an aid in the diagnosis of influenza from Nasopharyngeal swab specimens and should not be used as a sole basis for treatment. Nasal washings and aspirates are unacceptable for Xpert  Xpress SARS-CoV-2/FLU/RSV testing.  Fact Sheet for Patients: BloggerCourse.com  Fact Sheet for Healthcare Providers: SeriousBroker.it  This test is not yet approved or cleared by the [place] FDA and has been authorized for detection and/or diagnosis of SARS-CoV-2 by FDA under an Emergency Use Authorization (EUA). This EUA will remain in effect (meaning this test can be used) for the duration of the COVID-19 declaration under Section 564(b)(1) of the Act, 21 U.S.C. section 360bbb-3(b)(1), unless the authorization is terminated or revoked.  Performed at [facility], [address] [place], [place] [address]   . SARS Coronavirus 2 Ag 10/24/2020 Negative  Negative Final  . POC Amphetamine UR 10/24/2020 None Detected  NONE DETECTED (Cut Off Level 1000 ng/mL) Final  . POC Secobarbital (BAR) 10/24/2020 None Detected  NONE DETECTED (Cut Off Level 300 ng/mL) Final  . POC Buprenorphine (BUP) 10/24/2020 None Detected  NONE DETECTED (Cut Off Level 10 ng/mL) Final  . POC Oxazepam (BZO) 10/24/2020 None Detected  NONE DETECTED (Cut Off Level 300 ng/mL) Final  . POC Cocaine UR 10/24/2020 None Detected  NONE DETECTED (Cut Off Level 300 ng/mL) Final  . POC Methamphetamine UR 10/24/2020 None Detected  NONE DETECTED (Cut Off Level 1000 ng/mL) Final  . POC Morphine 10/24/2020 None Detected  NONE DETECTED (Cut Off Level 300 ng/mL) Final  . POC Oxycodone UR 10/24/2020 None Detected  NONE DETECTED (Cut Off Level 100 ng/mL) Final  . POC Methadone UR 10/24/2020 None Detected  NONE DETECTED (Cut Off Level 300 ng/mL) Final  . POC  Marijuana UR 10/24/2020 Positive* NONE DETECTED (Cut Off Level 50 ng/mL) Final  . WBC 10/24/2020 6.4  4.0 - 10.5 K/uL Final  . RBC 10/24/2020 5.31  4.22 - 5.81 MIL/uL Final  . Hemoglobin 10/24/2020 16.1  13.0 - 17.0 g/dL Final  . HCT 40/81/4481 42.7  39.0 - 52.0 % Final  . MCV 10/24/2020 80.4  80.0 - 100.0 fL Final  . MCH 10/24/2020 30.3  26.0 - 34.0 pg Final  . MCHC 10/24/2020 37.7* 30.0 - 36.0 g/dL Final   ELLIPTOCYTES  . RDW 10/24/2020 12.5  11.5 - 15.5 % Final  . Platelets 10/24/2020 225  150 - 400 K/uL Final  . nRBC 10/24/2020 0.0  0.0 - 0.2 % Final  . Neutrophils Relative % 10/24/2020 72  % Final  . Neutro Abs 10/24/2020 4.6  1.7 - 7.7 K/uL Final  . Lymphocytes Relative 10/24/2020 18  % Final  . Lymphs Abs 10/24/2020 1.1  0.7 - 4.0 K/uL Final  . Monocytes Relative 10/24/2020 10  % Final  . Monocytes Absolute 10/24/2020 0.6  0.1 - 1.0 K/uL Final  . Eosinophils Relative 10/24/2020 0  % Final  . Eosinophils Absolute 10/24/2020 0.0  0.0 - 0.5 K/uL Final  . Basophils Relative 10/24/2020 0  % Final  . Basophils Absolute 10/24/2020 0.0  0.0 - 0.1 K/uL Final  . Immature Granulocytes 10/24/2020 0  % Final  . Abs Immature Granulocytes 10/24/2020 0.02  0.00 - 0.07 K/uL Final  . Ovalocytes 10/24/2020 PRESENT   Final   Performed at [facility], [address], [place], [place] [address]  . Hgb A1c MFr Bld 10/24/2020 4.3* 4.8 - 5.6 % Final   Comment: (NOTE) Pre diabetes:          5.7%-6.4%  Diabetes:              >6.4%  Glycemic control for   <7.0% adults with diabetes   . Mean  Plasma Glucose 10/24/2020 76.71  mg/dL Final   Performed at [facility], [address], [place], [place] [address]  . Sodium 10/24/2020 134* 135 - 145 mmol/L Final  . Potassium 10/24/2020 3.6  3.5 - 5.1 mmol/L Final  . Chloride 10/24/2020 97* 98 - 111 mmol/L Final  . CO2 10/24/2020 24  22 - 32 mmol/L Final  . Glucose, Bld 10/24/2020 84  70 - 99 mg/dL Final   Glucose reference range applies only  to samples taken after fasting for at least 8 hours.  . BUN 10/24/2020 13  6 - 20 mg/dL Final  . Creatinine, Ser 10/24/2020 1.21  0.61 - 1.24 mg/dL Final  . Calcium 60/45/4098 10.0  8.9 - 10.3 mg/dL Final  . GFR, Estimated 10/24/2020 >60  >60 mL/min Final   Comment: (NOTE) Calculated using the CKD-EPI Creatinine Equation (2021)   . Anion gap 10/24/2020 13  5 - 15 Final   Performed at [facility], [address], [place], [place] [address]  . TSH 10/24/2020 1.422  0.350 - 4.500 uIU/mL Final   Comment: Performed by a 3rd Generation assay with a functional sensitivity of <=0.01 uIU/mL. Performed at [facility], [address], [place], [place] [address]   . SARS Coronavirus 2 Ag 10/24/2020 NEGATIVE  NEGATIVE Final   Comment: (NOTE) SARS-CoV-2 antigen NOT DETECTED.   Negative results are presumptive.  Negative results do not preclude SARS-CoV-2 infection and should not be used as the sole basis for treatment or other patient management decisions, including infection  control decisions, particularly in the presence of clinical signs and  symptoms consistent with COVID-19, or in those who have been in contact with the virus.  Negative results must be combined with clinical observations, patient history, and epidemiological information. The expected result is Negative.  Fact Sheet for Patients: https://www.jennings-kim.com/  Fact Sheet for Healthcare Providers: https://alexander-rogers.biz/  This test is not yet approved or cleared by the [place] FDA and  has been authorized for detection and/or diagnosis of SARS-CoV-2 by FDA under an Emergency Use Authorization (EUA).  This EUA will remain in effect (meaning this test can be used) for the duration of  the COV                          ID-19 declaration under Section 564(b)(1) of the Act, 21 U.S.C. section 360bbb-3(b)(1), unless the authorization is terminated or revoked sooner.    .  Cholesterol 10/24/2020 214* 0 - 200 mg/dL Final  . Triglycerides 10/24/2020 57  <150 mg/dL Final  . HDL 62/13/0865 57  >40 mg/dL Final  . Total CHOL/HDL Ratio 10/24/2020 3.8  RATIO Final  . VLDL 10/24/2020 11  0 - 40 mg/dL Final  . LDL Cholesterol 10/24/2020 146* 0 - 99 mg/dL Final   Comment:        Total Cholesterol/HDL:CHD Risk Coronary Heart Disease Risk Table                     Men   Women  1/2 Average Risk   3.4   3.3  Average Risk       5.0   4.4  2 X Average Risk   9.6   7.1  3 X Average Risk  23.4   11.0        Use the calculated Patient Ratio above and the CHD Risk Table to determine the patient's CHD Risk.  ATP III CLASSIFICATION (LDL):  <100     mg/dL   Optimal  836-629  mg/dL   Near or Above                    Optimal  130-159  mg/dL   Borderline  476-546  mg/dL   High  >503     mg/dL   Very High Performed at [facility], [address] [place], [place] [address]     Allergies: Patient has no known allergies.  PTA Medications: (Not in a hospital admission)   Medical Decision Making  Patient is a 25 year old male who presents to the [facility] for SI with a plan to cut his throat with a knife, paranoia, hallucinations, and delusions.  Patient appears to be experiencing worsening depression and also appears to be paranoid, delusional, and psychotic at this time. Patient's condition may be influenced/exacerbated by his THC/marijuana use, but based on patient's history and obtained collateral information, it is difficult to to determine if the patient's substance use is the primary cause of his condition for certain at this time. Based on patient's history, patient's presentation, my assessment, and obtained collateral information, believe that the patient is a potential threat to himself and others.  I recommend inpatient psychiatric treatment for the patient.     Recommendations  Based on my evaluation the patient does not appear to have an emergency medical  condition.  Recommend inpatient psychiatric treatment for the patient.  Patient is agreeable to this plan. Per Rosey Bath, [facility], patient is accepted to [facility] for inpatient psychiatric treatment pending a negative COVID PCR test result.  Will place the patient in [facility] continuous observation/assessment for further stabilization and treatment while awaiting PCR COVID test results for the patient to be transferred to [facility] to begin his inpatient psychiatric treatment. Labs and EKG recently done during patient's last [facility] stay on October 24, 2020.  Due to labs and EKG being done recently, no new labs/testing ordered at this time aside from rapid COVID and PCR COVID tests. 10/24/20 [facility] Labs reviewed:   -CBC with differential: Unremarkable  -BMP: Unremarkable  -UDS: Positive for marijuana  -Hemoglobin A1c: Slightly below reference range/normal limits at 4.3% (reference range 4.8 to 5.6%).  Believe that this hemoglobin A1c is appropriate to continue patient's antipsychotic medication that was recently started.  -TSH: Within normal limits  -Lipid panel: Total cholesterol slightly elevated at 214 mg/dL (reference range 0 to 812 mg/dL), LDL cholesterol elevated at 146 mg/dL (reference range 0 to 99 mg/dL).  Lipid panel otherwise unremarkable.  Believe that these lipid panel values are appropriate to continue patient's antipsychotic medication that was recently started.  Reviewed patient's October 24, 2020 EKG, which shows no acute/concerning findings or signs of ischemia, with QTC of 457 ms.  Believe that patient's QTC is appropriate to continue patient's antipsychotic medication was recently started. Will continue the following home medications that were started at patient's last [facility] visit:  -Zyprexa 5 mg PO Daily at bedtime for psychosis   -Prozac 20 mg PO Daily for MDD Patient not taking any additional home medications.  Vistaril 25 mg TID PO PRN for anxiety and Trazodone 50 mg PO at bedtime PRN for sleep  ordered.   XAVI TOMASIK, PA-C 10/27/20  1:38 AM

## 2020-10-26 NOTE — BH Assessment (Addendum)
Comprehensive Clinical Assessment (CCA) Note  10/26/2020 [name] [id number]  Chief Complaint: No chief complaint on file.  Visit Diagnosis: F33.3, Major depressive disorder, Recurrent episode, With psychotic features; F41.1, GAD    Recommendations for Services/Supports/Treatments: Margorie John, PA, reviewed pt's chart and information and met with pt, his mother, and his grandmother, and determined pt meets inpatient criteria. Pt's information is currently being reviewed by [facility]; if there is no appropriate bed for pt at [facility], pt's referral information will be faxed out to multiple hospitals for potential placement.     The patient demonstrates the following risk factors for suicide: Chronic risk factors for suicide include: psychiatric disorder of MDD, recurrent, with psychotic features and demographic factors (male, >31 y/o). Acute risk factors for suicide include: family or marital conflict, unemployment and social withdrawal/isolation. Protective factors for this patient include: positive social support. Considering these factors, the overall suicide risk at this point appears to be moderate. Patient is not appropriate for outpatient follow up.   Therefore, a 2:1 sitter for suicide precautions is recommended.    CCA Screening, Triage and Referral (STR) Cody Roberts is a 25 year old patient who came to the [facility]) due to experiencing ongoing psychosis/paranoia. Pt states he has an ongoing concern that he is going to be attacked and murdered and he has decided that he would rather kill himself by cutting his throat then be killed by someone else. Pt states he has been "very paranoid for years - it's ramped up the last few weeks to month." Pt shares he has been staying inside his home for the last 6 months with the exception of taking out the dog for a walk. He states he sits inside all day, every day. He shares he experiences "random instances," including,  but not limited to, videos he's streaming stopping at certain points, feelings of dejavu, or a song he'd been listening to on repeat stopping as soon as he starts humming along with it.  Pt shares he came to the [facility] on 10/24/2020 due to paranoia, such as the belief that people were eating food and drinking bottled water, or things being moved around, in his home. Pt shares his grandmother is currently at the beach so he's been home alone for 2 weeks, which doesn't explain the food disappearing. He states he's now experiencing the thought of killing himself prior to being murdered.  Pt acknowledges he's currently experiencing SI with a plan and shares he's experienced SI in the past, though not to this intensity. He states believing that, "either [I'm attacked and murdered] or I slit my throat." He denies he's ever attempted to kill himself in the past and shares the only hospitalization for mental health concerns was when he was observed overnight at the [facility] 10/24/2020 - 10/25/2020.  Pt denies HI, access to guns/weapons, or engagement with the legal system. He shares he experiences VH in the form of "flashes of light on both sides [of me], but never in the center" and AH of hearing his name being called. Pt shares he engages in NSSIB in the form of pulling out the hairs in his moustache.  Pt's protective factors include family support, stable housing, and no HI.  Pt's provided verbal consent for clinician and PA to speak with his mother and grandmother. Pt's mother shared that pt's behaviors started 6 - 8 years ago; she states it started after he cheated on a girlfriend and they broke up but then, at age 26,  he attempted to go into the [employer] but was denied due to medical concerns. Pt's mother shares pt "went from being a full-time student and working two jobs to not being able to leave the home." Pt's grandmother states she and pt moved into their home in August 2021 and that, since that time, he has  only showered twice. Pt's mother and grandmother share pt won't leave the home and that, when people come by the home, he rarely comes out to see them. Pt's mother acknowledged pt does walk the dog around the yard but that he won't leave the yard; she also shared pt has lost a bunch of weight and only eats approximately 1 meal/day.  Pt's mother shares pt has lost 3 relatives in the last year; she shares pt saw his great-grandmother die (she was sick) and that his other great-grandmother, whom he was very close to, died in 12/02/2019. Pt's paternal grandmother died in Sep 02, 2020.  Pt's mother shares pt told her, "I'm having homicidal thoughts." She states pt believes someone has been in the home, has pointed a laser light at him, someone is in the yard spying on him, and that someone comes into the home and opens bottles of water and eats Oreos. She shares that, while his grandmother was at the beach, he sealed a partial cup of coffee and a piece of cake in a Ziplock bag due to the belief that it was poisoned.  Pt's grandmother shares that for 1/2 of January and all of February pt has been having rages or anger every night with his grandmother. She shares pt will come into the kitchen for something and will begin drilling her on the subjects of racism, homosexuality, and religion. She states that pt will yell at her until she "answers correctly," which is saying what he wants her to say. She shares pt has broken bowls of hers by slamming them down and will grab the refrigerator and begin to shake it, stating that he won't stop until she correctly answers him. She states that pt has become so angry he's begun yelling at her and calling her names, such as "mf-er," "bitch," and "c**t."  Pt is oriented x5. His recent/remote memory is intact. Pt was cooperative throughout the assessment process. Pt's insight, judgement, and impulse control is poor at this time.   Patient Reported Information How did you  hear about [place]? Self  Referral name: Self  Referral phone number: [phone] (N/A)   Whom do you see for routine medical problems? Primary Care  Practice/Facility Name: Dr. Emeterio Reeve at [facility]  Practice/Facility Phone Number: [phone] (Unknown)  Name of Contact: Dr. Emeterio Reeve at [facility]  Contact Number: Unknown  Contact Fax Number: Unknown  Prescriber Name: Dr. Emeterio Reeve at [facility]  Prescriber Address (if known): Unknown   What Is the Reason for Your Visit/Call Today? Paranoia with SI  How Long Has This Been Causing You Problems? 1 wk - 1 month  What Do You Feel Would Help You the Most Today? Other (Comment) (Pt is unsure)   Have You Recently Been in Any Inpatient Treatment (Hospital/Detox/Crisis Center/28-Day Program)? No (Pt was observed overnight at the [facility] from 10/24/2020 - 10/25/2020)  Name/Location of Program/Hospital:No data recorded How Long Were You There? No data recorded When Were You Discharged? No data recorded  Have You Ever Received Services From [facility] Before? Yes  Who Do You See at [facility]? Dr. Emeterio Reeve at [facility]; pt  was seen overnight at the [facility] from 10/24/2020 - 10/25/2020   Have You Recently Had Any Thoughts About [place]? Yes  Are You Planning to Commit Suicide/Harm Yourself At This time? -- (Pt states he is paranoid that someone is going to attack and kill him and states he'd rather kill himself by cutting his throat than be murdered.)   Have you Recently Had Thoughts About [place]? No  Explanation: No data recorded  Have You Used Any Alcohol or Drugs in the Past 24 Hours? No  How Long Ago Did You Use Drugs or Alcohol? 0100  What Did You Use and How Much? Delta 8   Do You Currently Have a Therapist/Psychiatrist? No  Name of Therapist/Psychiatrist: No data recorded  Have You Been Recently Discharged From Any Office Practice or Programs? Yes  Explanation  of Discharge From Practice/Program: Pt was seen for overnight observation at the [facility] from 10/24/2020 - 10/25/2020     CCA Screening Triage Referral Assessment Type of Contact: Face-to-Face  Is this Initial or Reassessment? No data recorded Date Telepsych consult ordered in [facility]:  No data recorded Time Telepsych consult ordered in [facility]:  No data recorded  Patient Reported Information Reviewed? Yes  Patient Left Without Being Seen? No data recorded Reason for Not Completing Assessment: No data recorded  Collateral Involvement: None   Does Patient Have a Court Appointed Legal Guardian? No data recorded Name and Contact of Legal Guardian: No data recorded If Minor and Not Living with Parent(s), Who has Custody? N/A  Is CPS involved or ever been involved? Never  Is APS involved or ever been involved? Never   Patient Determined To Be At Risk for Harm To Self or Others Based on Review of Patient Reported Information or Presenting Complaint? Yes, for Self-Harm  Method: No data recorded Availability of Means: No data recorded Intent: No data recorded Notification Required: No data recorded Additional Information for Danger to Others Potential: No data recorded Additional Comments for Danger to Others Potential: No data recorded Are There Guns or Other Weapons in Your Home? No data recorded Types of Guns/Weapons: No data recorded Are These Weapons Safely Secured?                            No data recorded Who Could Verify You Are Able To Have These Secured: No data recorded Do You Have any Outstanding Charges, Pending Court Dates, Parole/Probation? No data recorded Contacted To Inform of Risk of Harm To Self or Others: Family/Significant Other: (Pt's mother is aware)   Location of Assessment: [facility] Assessment Services   Does Patient Present under Involuntary Commitment? No  IVC Papers Initial File Date: No data recorded  [place] of Residence: [place]   Patient Currently  Receiving the Following Services: Not Receiving Services   Determination of Need: Urgent (48 hours)   Options For Referral: [facility]     CCA Biopsychosocial Intake/Chief Complaint:  Paranoia with SI  Current Symptoms/Problems: NA   Patient Reported Schizophrenia/Schizoaffective Diagnosis in Past: No   Strengths: NA  Preferences: NA  Abilities: NA   Type of Services Patient Feels are Needed: NA   Initial Clinical Notes/Concerns: NA   Mental Health Symptoms Depression:  Change in energy/activity; Difficulty Concentrating; Fatigue; Hopelessness; Increase/decrease in appetite; Irritability; Sleep (too much or little); Tearfulness; Weight gain/loss; Worthlessness   Duration of Depressive symptoms: Greater than two weeks   Mania:  None  Anxiety:   Worrying; Tension; Sleep; Restlessness; Irritability; Fatigue; Difficulty concentrating   Psychosis:  Delusions   Duration of Psychotic symptoms: Less than six months   Trauma:  None   Obsessions:  None   Compulsions:  None   Inattention:  None   Hyperactivity/Impulsivity:  N/A   Oppositional/Defiant Behaviors:  N/A   Emotional Irregularity:  N/A   Other Mood/Personality Symptoms:  None noted    Mental Status Exam Appearance and self-care  Stature:  Average   Weight:  Thin   Clothing:  Disheveled   Grooming:  Neglected   Cosmetic use:  None   Posture/gait:  Normal   Motor activity:  Not Remarkable   Sensorium  Attention:  Normal   Concentration:  Normal   Orientation:  X5   Recall/memory:  Normal   Affect and Mood  Affect:  Anxious   Mood:  Anxious   Relating  Eye contact:  Normal   Facial expression:  Anxious   Attitude toward examiner:  Cooperative   Thought and Language  Speech flow: Clear and Coherent   Thought content:  Appropriate to Mood and Circumstances   Preoccupation:  Ruminations   Hallucinations:  Visual   Organization:  No data recorded  [employer] of Knowledge:  Fair   Intelligence:  Average   Abstraction:  Normal   Judgement:  Impaired   Reality Testing:  Distorted   Insight:  Fair   Decision Making:  Paralyzed   Social Functioning  Social Maturity:  Irresponsible   Social Judgement:  Normal   Stress  Stressors:  Family conflict   Coping Ability:  Deficient supports   Skill Deficits:  Activities of daily living; Interpersonal; Self-care   Supports:  Family     Religion: Religion/Spirituality Are You A Religious Person?: No How Might This Affect Treatment?: N/A  Leisure/Recreation: Leisure / Recreation Do You Have Hobbies?: No  Exercise/Diet: Exercise/Diet Do You Exercise?: No Have You Gained or Lost A Significant Amount of Weight in the Past Six Months?: No Do You Follow a Special Diet?: No Do You Have Any Trouble Sleeping?: Yes Explanation of Sleeping Difficulties: Intermittent trouble sleeping   CCA Employment/Education Employment/Work Situation: Employment / Work Situation Employment situation: Unemployed Patient's job has been impacted by current illness: No What is the longest time patient has a held a job?: [employer] 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 Has patient ever been in the [employer]?: No  Education: Education Is Patient Currently Attending School?: No Last Grade Completed: 12 Name of High School: NA Did Teacher, adult education From [employer]?: Yes Did Physicist, medical?: No Did Heritage manager?: No Did You Have Any Special Interests In School?: None noted Did You Have An Individualized Education Program (IIEP): No Did You Have Any Difficulty At School?: No Patient's Education Has Been Impacted by Current Illness: No   CCA Family/Childhood History Family and Relationship History: Family history Marital status: Single Are you sexually active?: No What is your sexual orientation?: Heterosexual Has your sexual activity been affected  by drugs, alcohol, medication, or emotional stress?: N/A Does patient have children?: No  Childhood History:  Childhood History By whom was/is the patient raised?: Mother/father and step-parent Additional childhood history information: Parents divorced Description of patient's relationship with caregiver when they were a child: Stable Patient's description of current relationship with people who raised him/her: Good relationship with mother, does not speak to father How were you disciplined when you got  in trouble as a child/adolescent?: "Verbal abuse" Does patient have siblings?: Yes Number of Siblings: 5 Description of patient's current relationship with siblings: Speaks with siblings on his mom's side Did patient suffer any verbal/emotional/physical/sexual abuse as a child?: Yes Did patient suffer from severe childhood neglect?: No Has patient ever been sexually abused/assaulted/raped as an adolescent or adult?: No Was the patient ever a victim of a crime or a disaster?: No Witnessed domestic violence?: No Has patient been affected by domestic violence as an adult?: No  Child/Adolescent Assessment:     CCA Substance Use Alcohol/Drug Use: Alcohol / Drug Use Pain Medications: see MAR Prescriptions: see MAR Over the Counter: see MAR History of alcohol / drug use?: Yes Longest period of sobriety (when/how long): Unknown Negative Consequences of Use:  (None) Withdrawal Symptoms:  (None) Substance #1 Name of Substance 1: Delta 8 1 - Age of First Use: 23 1 - Amount (size/oz): Uses a vape pen throughout the day 1 - Frequency: Through out the day everyday 1 - Duration: 1 year 1 - Last Use / Amount: 3/5 1 - Method of Aquiring: Purchased in store 1- Route of Use: Vape                       ASAM's:  Six Dimensions of Multidimensional Assessment  Dimension 1:  Acute Intoxication and/or Withdrawal Potential:   Dimension 1:  Description of individual's past and current  experiences of substance use and withdrawal: Last use 10/23/2020  Dimension 2:  Biomedical Conditions and Complications:   Dimension 2:  Description of patient's biomedical conditions and  complications: none  Dimension 3:  Emotional, Behavioral, or Cognitive Conditions and Complications:  Dimension 3:  Description of emotional, behavioral, or cognitive conditions and complications: Depression, anxiety, paranoid delusions  Dimension 4:  Readiness to Change:  Dimension 4:  Description of Readiness to Change criteria: Stopped cold [place] with new prescriptions given 10/25/2020  Dimension 5:  Relapse, Continued use, or Continued Problem Potential:  Dimension 5:  Relapse, continued use, or continued problem potential critiera description: Patient reports using alcohol to cope prior to Delta 8 use.  Dimension 6:  Recovery/Living Environment:  Dimension 6:  Recovery/Iiving environment criteria description: Lives with grandmother, who is supportive  ASAM Severity Score: ASAM's Severity Rating Score: 7  ASAM Recommended Level of Treatment: ASAM Recommended Level of Treatment: Level I Outpatient Treatment   Substance use Disorder (SUD) Substance Use Disorder (SUD)  Checklist Symptoms of Substance Use: Continued use despite having a persistent/recurrent physical/psychological problem caused/exacerbated by use,Continued use despite persistent or recurrent social, interpersonal problems, caused or exacerbated by use,Evidence of tolerance,Substance(s) often taken in larger amounts or over longer times than was intended  Recommendations for Services/Supports/Treatments: Recommendations for Services/Supports/Treatments Recommendations For Services/Supports/Treatments: Individual Therapy   Margorie John, PA, reviewed pt's chart and information and met with pt, his mother, and his grandmother, and determined pt meets inpatient criteria. Pt's information is currently being reviewed by [facility]; if there is no appropriate  bed for pt at [facility], pt's referral information will be faxed out to multiple hospitals for potential placement.    DSM5 Diagnoses: Patient Active Problem List   Diagnosis Date Noted  . MDD (major depressive disorder), recurrent severe, without psychosis ([place]) 10/24/2020  . Eustachian tube dysfunction, bilateral 04/19/2017  . Dysthymia 04/19/2017  . Chest tightness 04/18/2017  . Sinusitis 05/18/2016  . Vision changes 05/18/2016    Patient Centered Plan: Patient is on the following Treatment Plan(s):  Depression and Impulse Control   Referrals to Alternative Service(s): Referred to Alternative Service(s):   Place:   Date:   Time:    Referred to Alternative Service(s):   Place:   Date:   Time:    Referred to Alternative Service(s):   Place:   Date:   Time:    Referred to Alternative Service(s):   Place:   Date:   Time:     Dannielle Burn, LMFT

## 2020-10-26 NOTE — ED Triage Notes (Signed)
Pt presents with paranoia and psychosis increasing in severity ths past few mos.  Pt discharged from [facility] 10/25/20, SI noted, plan to slit throat if he is attacked by someone. Denies HI, visual hallucinations in the form of flashing lights noted.

## 2020-10-27 ENCOUNTER — Other Ambulatory Visit: Payer: Self-pay

## 2020-10-27 ENCOUNTER — Telehealth (HOSPITAL_COMMUNITY): Payer: BC Managed Care – PPO | Admitting: Osteopathic Medicine

## 2020-10-27 ENCOUNTER — Inpatient Hospital Stay (HOSPITAL_COMMUNITY)
Admission: RE | Admit: 2020-10-27 | Discharge: 2020-11-04 | DRG: 885 | Disposition: A | Discharge status: Other Acute Inpatient Hospital | Payer: BC Managed Care – PPO | Attending: Psychiatry | Admitting: Psychiatry

## 2020-10-27 ENCOUNTER — Encounter (HOSPITAL_COMMUNITY): Payer: Self-pay | Admitting: Emergency Medicine

## 2020-10-27 ENCOUNTER — Encounter (HOSPITAL_COMMUNITY): Payer: Self-pay | Admitting: Student

## 2020-10-27 DIAGNOSIS — F19951 Other psychoactive substance use, unspecified with psychoactive substance-induced psychotic disorder with hallucinations: Secondary | ICD-10-CM

## 2020-10-27 DIAGNOSIS — F419 Anxiety disorder, unspecified: Secondary | ICD-10-CM | POA: Diagnosis present

## 2020-10-27 DIAGNOSIS — F29 Unspecified psychosis not due to a substance or known physiological condition: Secondary | ICD-10-CM

## 2020-10-27 DIAGNOSIS — F12151 Cannabis abuse with psychotic disorder with hallucinations: Secondary | ICD-10-CM | POA: Diagnosis present

## 2020-10-27 DIAGNOSIS — F2081 Schizophreniform disorder: Secondary | ICD-10-CM | POA: Diagnosis present

## 2020-10-27 DIAGNOSIS — F333 Major depressive disorder, recurrent, severe with psychotic symptoms: Secondary | ICD-10-CM | POA: Insufficient documentation

## 2020-10-27 DIAGNOSIS — Z20822 Contact with and (suspected) exposure to covid-19: Secondary | ICD-10-CM | POA: Diagnosis present

## 2020-10-27 DIAGNOSIS — Z79899 Other long term (current) drug therapy: Secondary | ICD-10-CM | POA: Diagnosis not present

## 2020-10-27 DIAGNOSIS — F19959 Other psychoactive substance use, unspecified with psychoactive substance-induced psychotic disorder, unspecified: Secondary | ICD-10-CM | POA: Diagnosis not present

## 2020-10-27 DIAGNOSIS — G939 Disorder of brain, unspecified: Secondary | ICD-10-CM | POA: Diagnosis not present

## 2020-10-27 DIAGNOSIS — F1210 Cannabis abuse, uncomplicated: Secondary | ICD-10-CM

## 2020-10-27 DIAGNOSIS — G4700 Insomnia, unspecified: Secondary | ICD-10-CM | POA: Diagnosis present

## 2020-10-27 DIAGNOSIS — F22 Delusional disorders: Secondary | ICD-10-CM | POA: Diagnosis present

## 2020-10-27 DIAGNOSIS — G930 Cerebral cysts: Secondary | ICD-10-CM | POA: Diagnosis not present

## 2020-10-27 LAB — RESP PANEL BY RT-PCR (FLU A&B, COVID) ARPGX2
Influenza A by PCR: NEGATIVE
Influenza B by PCR: NEGATIVE
SARS Coronavirus 2 by RT PCR: NEGATIVE

## 2020-10-27 MED ORDER — OLANZAPINE 5 MG PO TBDP
5.0000 mg | ORAL_TABLET | Freq: Every day | ORAL | Status: DC
  Administered 2020-10-27 – 2020-10-29 (×3): 5 mg via ORAL
  Filled 2020-10-27 (×6): qty 1

## 2020-10-27 MED ORDER — TRAZODONE HCL 50 MG PO TABS
50.0000 mg | ORAL_TABLET | Freq: Every evening | ORAL | Status: DC | PRN
  Administered 2020-10-27 – 2020-10-31 (×5): 50 mg via ORAL
  Filled 2020-10-27 (×7): qty 1

## 2020-10-27 MED ORDER — OLANZAPINE 10 MG PO TBDP
10.0000 mg | ORAL_TABLET | Freq: Three times a day (TID) | ORAL | Status: DC | PRN
  Administered 2020-10-29: 10 mg via ORAL

## 2020-10-27 MED ORDER — LORAZEPAM 1 MG PO TABS
1.0000 mg | ORAL_TABLET | Freq: Four times a day (QID) | ORAL | Status: DC | PRN
  Administered 2020-10-29: 1 mg via ORAL
  Filled 2020-10-27: qty 1

## 2020-10-27 MED ORDER — MAGNESIUM HYDROXIDE 400 MG/5ML PO SUSP
30.0000 mL | Freq: Every day | ORAL | Status: DC | PRN
  Administered 2020-11-03: 30 mL via ORAL
  Filled 2020-10-27: qty 30

## 2020-10-27 MED ORDER — HYDROXYZINE HCL 25 MG PO TABS
25.0000 mg | ORAL_TABLET | Freq: Three times a day (TID) | ORAL | Status: DC | PRN
  Administered 2020-10-27 – 2020-11-01 (×10): 25 mg via ORAL
  Filled 2020-10-27 (×10): qty 1

## 2020-10-27 MED ORDER — LORAZEPAM 1 MG PO TABS: 1.0000 mg | ORAL_TABLET | ORAL | Status: DC | PRN

## 2020-10-27 MED ORDER — ACETAMINOPHEN 325 MG PO TABS: 650.0000 mg | ORAL_TABLET | Freq: Four times a day (QID) | ORAL | Status: DC | PRN

## 2020-10-27 MED ORDER — ZIPRASIDONE MESYLATE 20 MG IM SOLR
20.0000 mg | Freq: Four times a day (QID) | INTRAMUSCULAR | Status: DC | PRN
  Filled 2020-10-27: qty 20

## 2020-10-27 MED ORDER — ENSURE ENLIVE PO LIQD
237.0000 mL | Freq: Two times a day (BID) | ORAL | Status: DC
  Administered 2020-10-27 – 2020-11-03 (×13): 237 mL via ORAL
  Filled 2020-10-27 (×19): qty 237

## 2020-10-27 MED ORDER — FLUOXETINE HCL 20 MG PO CAPS
20.0000 mg | ORAL_CAPSULE | Freq: Every day | ORAL | Status: DC
  Administered 2020-10-27 – 2020-10-30 (×4): 20 mg via ORAL
  Filled 2020-10-27 (×7): qty 1

## 2020-10-27 MED ORDER — OLANZAPINE 10 MG PO TABS
10.0000 mg | ORAL_TABLET | Freq: Every day | ORAL | Status: DC
  Administered 2020-10-27: 10 mg via ORAL
  Filled 2020-10-27 (×2): qty 1

## 2020-10-27 MED ORDER — ALUM & MAG HYDROXIDE-SIMETH 200-200-20 MG/5ML PO SUSP: 30.0000 mL | ORAL | Status: DC | PRN

## 2020-10-27 MED ORDER — OLANZAPINE 5 MG PO TABS
5.0000 mg | ORAL_TABLET | Freq: Every day | ORAL | Status: DC
  Filled 2020-10-27: qty 1

## 2020-10-27 NOTE — BH Assessment (Incomplete)
Care Management - Follow Up [facility] Discharges   Patient is inpatient at [facility].

## 2020-10-27 NOTE — H&P (Signed)
Psychiatric Admission Assessment Adult  Patient Identification: Cody Roberts MRN:  [id number] Date of Evaluation:  10/27/2020 Chief Complaint:  MDD (major depressive disorder), recurrent, severe, with psychosis (HCC) [F33.3] Principal Diagnosis: <principal problem not specified> Diagnosis:  Active Problems:   MDD (major depressive disorder), recurrent, severe, with psychosis (HCC)  History of Present Illness: Patient is seen and examined.  Patient is a 25 year old male with a past psychiatric history significant for major depression who presented to the [facility] on 10/24/2020 secondary to paranoid delusions.  The patient was brought to the facility by his mother.  In the notes from the behavioral health center he stated he felt as though he had been watching himself on television, and was concerned that things were "going to happen to me".  On examination this morning the patient stated that he is felt fearful that people are following him or watching him since at least November or December 2021.  He stated that he believes that is the correct timeframe.  He also admitted to auditory hallucinations.  Review of the electronic medical record revealed that he had been treated by his family practice physician for quite a while with fluoxetine.  He was also given Wellbutrin at one point.  He stated that he had stopped taking the fluoxetine after 3 months, and never took the Wellbutrin.  He stated he had not been seen by a psychiatrist until he was evaluated at the behavioral health urgent care center.  He admitted to marijuana and delta 8 usage.  He also stated that he had previously drank alcohol significantly, but had not had any alcohol since 2020.  When asked about what brought him to the behavioral health urgent care center on that particular date versus something from several months ago he stated that he had questioned his grandmother, and was uncomfortable with what ever  answer she was giving, or which she would not answer questions.  He is clearly paranoid.  He stated that his family has psychiatric illness, but nothing formally diagnosed.  Although he stated he had not seen a psychiatrist before, the comprehensive clinical assessment note stated that he had been seeing a therapist and a psychiatrist, but it stopped seeing them when the pandemic started.  It also stated that his struggle with depression and anxiety began approximately 6 years ago.  He was seen by the nurse practitioner at the [facility] on 10/25/2020.  The patient had agreed to take Zyprexa and fluoxetine.  He stated at that time he felt as though he was doing better.  The patient did state that he would continue the medications, but did not feel safe to discharge home.  The patient's mother stated that she had no concern about the patient returning to her home, she did state she was concerned about his compliance given his noncompliance in the past.  The decision was made to admit him to the hospital for evaluation and stabilization.  The patient denied any suicidal or homicidal ideation.  He is still significantly paranoid, glancing around the room, and did admit to auditory hallucinations in the past.  Associated Signs/Symptoms: Depression Symptoms:  depressed mood, anhedonia, insomnia, psychomotor agitation, fatigue, feelings of worthlessness/guilt, difficulty concentrating, hopelessness, suicidal thoughts without plan, anxiety, loss of energy/fatigue, disturbed sleep, Duration of Depression Symptoms: Greater than two weeks  (Hypo) Manic Symptoms:  Delusions, Hallucinations, Impulsivity, Irritable Mood, Labiality of Mood, Anxiety Symptoms:  Excessive Worry, Psychotic Symptoms:  Delusions, Hallucinations: Auditory Ideas  of Reference, Paranoia, PTSD Symptoms: Negative Total Time spent with patient: 1 hour  Past Psychiatric History: Patient stated that  this was his first psychiatric admission.  He told me that he had never seen a psychiatrist before, but there are notes from the behavioral health urgent care center that stated he had been seeing a therapist and a psychiatrist prior to the pandemic, but after the pandemic started he did not follow-up with them.  His primary care provider is been writing his antidepressant medicines.  Previous to this admission the 2 medicines he been treated with in the past were fluoxetine and Wellbutrin.  He never really took the Wellbutrin.  Is the patient at risk to self? Yes.    Has the patient been a risk to self in the past 6 months? Yes.    Has the patient been a risk to self within the distant past? No.  Is the patient a risk to others? No.  Has the patient been a risk to others in the past 6 months? No.  Has the patient been a risk to others within the distant past? No.   Prior Inpatient Therapy:   Prior Outpatient Therapy:    Alcohol Screening: 1. How often do you have a drink containing alcohol?: Never 2. How many drinks containing alcohol do you have on a typical day when you are drinking?: 1 or 2 3. How often do you have six or more drinks on one occasion?: Never AUDIT-C Score: 0 4. How often during the last year have you found that you were not able to stop drinking once you had started?: Never 5. How often during the last year have you failed to do what was normally expected from you because of drinking?: Never 6. How often during the last year have you needed a first drink in the morning to get yourself going after a heavy drinking session?: Never 7. How often during the last year have you had a feeling of guilt of remorse after drinking?: Never 8. How often during the last year have you been unable to remember what happened the night before because you had been drinking?: Never 9. Have you or someone else been injured as a result of your drinking?: No 10. Has a relative or friend or a doctor  or another health worker been concerned about your drinking or suggested you cut down?: No Alcohol Use Disorder Identification Test Final Score (AUDIT): 0 Substance Abuse History in the last 12 months:  Yes.   Consequences of Substance Abuse: Medical Consequences:  Clearly the delta 8 is contributing to his psychosis. Previous Psychotropic Medications: Yes  Psychological Evaluations: Yes  Past Medical History:  Past Medical History:  Diagnosis Date  . Chest tightness 04/18/2017  . Dysthymia 04/19/2017   Referral downstairs to behavioral health for counseling, patient is advised on mental health safety precautions, can come to me if he would like to discuss meds  . Eustachian tube dysfunction, bilateral 04/19/2017   Canals clear and tympanic membranes normal, advised OTC Flonase and or second generation antihistamine Claritin/Zyrtec/Allegra or similar generic  . Sinusitis 05/18/2016  . Vision changes 05/18/2016   History reviewed. No pertinent surgical history. Family History: History reviewed. No pertinent family history. Family Psychiatric  History: Noncontributory Tobacco Screening: Have you used any form of tobacco in the last 30 days? (Cigarettes, Smokeless Tobacco, Cigars, and/or Pipes): No Social History:  Social History   Substance and Sexual Activity  Alcohol Use No  Social History   Substance and Sexual Activity  Drug Use Yes  . Types: Marijuana   Comment: Delta 8    Additional Social History: Marital status: Single Are you sexually active?: No What is your sexual orientation?: Heterosexual Has your sexual activity been affected by drugs, alcohol, medication, or emotional stress?: N/A Does patient have children?: No                         Allergies:  No Known Allergies Lab Results:  Results for orders placed or performed during the hospital encounter of 10/26/20 (from the past 48 hour(s))  Resp Panel by RT-PCR (Flu A&B, Covid) Nasopharyngeal Swab      Status: None   Collection Time: 10/26/20 10:41 PM   Specimen: Nasopharyngeal Swab; Nasopharyngeal(NP) swabs in vial transport medium  Result Value Ref Range   SARS Coronavirus 2 by RT PCR NEGATIVE NEGATIVE    Comment: (NOTE) SARS-CoV-2 target nucleic acids are NOT DETECTED.  The SARS-CoV-2 RNA is generally detectable in upper respiratory specimens during the acute phase of infection. The lowest concentration of SARS-CoV-2 viral copies this assay can detect is 138 copies/mL. A negative result does not preclude SARS-Cov-2 infection and should not be used as the sole basis for treatment or other patient management decisions. A negative result may occur with  improper specimen collection/handling, submission of specimen other than nasopharyngeal swab, presence of viral mutation(s) within the areas targeted by this assay, and inadequate number of viral copies(<138 copies/mL). A negative result must be combined with clinical observations, patient history, and epidemiological information. The expected result is Negative.  Fact Sheet for Patients:  BloggerCourse.com  Fact Sheet for Healthcare Providers:  SeriousBroker.it  This test is no t yet approved or cleared by the [place] FDA and  has been authorized for detection and/or diagnosis of SARS-CoV-2 by FDA under an Emergency Use Authorization (EUA). This EUA will remain  in effect (meaning this test can be used) for the duration of the COVID-19 declaration under Section 564(b)(1) of the Act, 21 U.S.C.section 360bbb-3(b)(1), unless the authorization is terminated  or revoked sooner.       Influenza A by PCR NEGATIVE NEGATIVE   Influenza B by PCR NEGATIVE NEGATIVE    Comment: (NOTE) The Xpert Xpress SARS-CoV-2/FLU/RSV plus assay is intended as an aid in the diagnosis of influenza from Nasopharyngeal swab specimens and should not be used as a sole basis for treatment. Nasal  washings and aspirates are unacceptable for Xpert Xpress SARS-CoV-2/FLU/RSV testing.  Fact Sheet for Patients: BloggerCourse.com  Fact Sheet for Healthcare Providers: SeriousBroker.it  This test is not yet approved or cleared by the [place] FDA and has been authorized for detection and/or diagnosis of SARS-CoV-2 by FDA under an Emergency Use Authorization (EUA). This EUA will remain in effect (meaning this test can be used) for the duration of the COVID-19 declaration under Section 564(b)(1) of the Act, 21 U.S.C. section 360bbb-3(b)(1), unless the authorization is terminated or revoked.  Performed at [facility], [address], [place], [place] [address]   POC SARS Coronavirus 2 Ag-ED - Nasal Swab     Status: None (Preliminary result)   Collection Time: 10/26/20 10:41 PM  Result Value Ref Range   SARS Coronavirus 2 Ag Negative Negative  POC SARS Coronavirus 2 Ag     Status: None   Collection Time: 10/26/20 10:55 PM  Result Value Ref Range   SARS Coronavirus 2 Ag NEGATIVE NEGATIVE  Comment: (NOTE) SARS-CoV-2 antigen NOT DETECTED.   Negative results are presumptive.  Negative results do not preclude SARS-CoV-2 infection and should not be used as the sole basis for treatment or other patient management decisions, including infection  control decisions, particularly in the presence of clinical signs and  symptoms consistent with COVID-19, or in those who have been in contact with the virus.  Negative results must be combined with clinical observations, patient history, and epidemiological information. The expected result is Negative.  Fact Sheet for Patients: https://www.jennings-kim.com/https://www.fda.gov/media/141569/download  Fact Sheet for Healthcare Providers: https://alexander-rogers.biz/https://www.fda.gov/media/141568/download  This test is not yet approved or cleared by the [place] FDA and  has been authorized for detection and/or diagnosis of  SARS-CoV-2 by FDA under an Emergency Use Authorization (EUA).  This EUA will remain in effect (meaning this test can be used) for the duration of  the COV ID-19 declaration under Section 564(b)(1) of the Act, 21 U.S.C. section 360bbb-3(b)(1), unless the authorization is terminated or revoked sooner.      Blood Alcohol level:  No results found for: [facility]  Metabolic Disorder Labs:  Lab Results  Component Value Date   HGBA1C 4.3 (L) 10/24/2020   MPG 76.71 10/24/2020   No results found for: PROLACTIN Lab Results  Component Value Date   CHOL 214 (H) 10/24/2020   TRIG 57 10/24/2020   HDL 57 10/24/2020   CHOLHDL 3.8 10/24/2020   VLDL 11 10/24/2020   LDLCALC 146 (H) 10/24/2020    Current Medications: Current Facility-Administered Medications  Medication Dose Route Frequency Provider Last Rate Last Admin  . acetaminophen (TYLENOL) tablet 650 mg  650 mg Oral Q6H PRN Jaclyn Shaggyaylor, Cody W, PA-C      . alum & mag hydroxide-simeth (MAALOX/MYLANTA) 200-200-20 MG/5ML suspension 30 mL  30 mL Oral Q4H PRN Jaclyn Shaggyaylor, Cody W, PA-C      . feeding supplement (ENSURE ENLIVE / ENSURE PLUS) liquid 237 mL  237 mL Oral BID BM Melbourne Abtsaylor, Cody W, PA-C   237 mL at 10/27/20 0928  . FLUoxetine (PROZAC) capsule 20 mg  20 mg Oral Daily Melbourne Abtsaylor, Cody W, PA-C   20 mg at 10/27/20 [id number]  . hydrOXYzine (ATARAX/VISTARIL) tablet 25 mg  25 mg Oral TID PRN Jaclyn Shaggyaylor, Cody W, PA-C      . LORazepam (ATIVAN) tablet 1 mg  1 mg Oral Q6H PRN Antonieta Pertlary, Naylin Burkle Lawson, MD      . OLANZapine zydis [facility](ZYPREXA) disintegrating tablet 10 mg  10 mg Oral Q8H PRN Antonieta Pertlary, Mei Suits Lawson, MD       And  . LORazepam (ATIVAN) tablet 1 mg  1 mg Oral PRN Antonieta Pertlary, Phill Steck Lawson, MD       And  . ziprasidone (GEODON) injection 20 mg  20 mg Intramuscular Q6H PRN Antonieta Pertlary, Keerthana Vanrossum Lawson, MD      . magnesium hydroxide (MILK OF MAGNESIA) suspension 30 mL  30 mL Oral Daily PRN Melbourne Abtsaylor, Cody W, PA-C      . OLANZapine (ZYPREXA) tablet 10 mg  10 mg Oral QHS Antonieta Pertlary, Rick Carruthers Lawson, MD      .  OLANZapine zydis [facility](ZYPREXA) disintegrating tablet 5 mg  5 mg Oral Daily Antonieta Pertlary, Briea Mcenery Lawson, MD   5 mg at 10/27/20 0930  . traZODone (DESYREL) tablet 50 mg  50 mg Oral QHS PRN Jaclyn Shaggyaylor, Cody W, PA-C       PTA Medications: Medications Prior to Admission  Medication Sig Dispense Refill Last Dose  . FLUoxetine (PROZAC) 20 MG capsule Take 1 capsule (20 mg total)  by mouth daily. 30 capsule 1   . OLANZapine (ZYPREXA) 5 MG tablet Take 1 tablet (5 mg total) by mouth at bedtime. 30 tablet 1     Musculoskeletal: Strength & Muscle Tone: within normal limits Gait & Station: normal Patient leans: N/A            Psychiatric Specialty Exam:  Presentation  General Appearance: Bizarre  Eye Contact:Fair  Speech:Normal Rate  Speech Volume:Decreased  Handedness:Right   Mood and Affect  Mood:Anxious; Dysphoric; Depressed  Affect:Congruent   Thought Process  Thought Processes:Goal Directed  Duration of Psychotic Symptoms: Less than six months  Past Diagnosis of Schizophrenia or Psychoactive disorder: No  Descriptions of Associations:Circumstantial  Orientation:Full (Time, Place and Person)  Thought Content:Illogical; Paranoid Ideation  Hallucinations:Hallucinations: Auditory  Ideas of Reference:Delusions; Paranoia  Suicidal Thoughts:Suicidal Thoughts: Yes, Passive SI Active Intent and/or Plan: Without Intent; Without Means to Carry Out SI Passive Intent and/or Plan: Without Intent; Without Means to Carry Out  Homicidal Thoughts:Homicidal Thoughts: No   Sensorium  Memory:Immediate Fair; Recent Fair; Remote Fair  Judgment:Impaired  Insight:Fair   Executive Functions  Concentration:Fair  Attention Span:Fair  Recall:Fair  Fund of Knowledge:Fair  Language:Good   Psychomotor Activity  Psychomotor Activity:Psychomotor Activity: Increased   Assets  Assets:Housing; Social Support; Resilience; Physical Health   Sleep  Sleep:Sleep: Poor Number of Hours of  Sleep: 2    Physical Exam: Physical Exam Vitals and nursing note reviewed.  HENT:     Head: Normocephalic and atraumatic.  Pulmonary:     Effort: Pulmonary effort is normal.  Neurological:     General: No focal deficit present.     Mental Status: He is alert and oriented to person, place, and time.    ROS Blood pressure (!) 141/109, pulse (!) 103, temperature 97.8 F (36.6 C), temperature source Oral, resp. rate 18, height 5\' 7"  (1.702 m), weight 56.2 kg, SpO2 100 %. Body mass index is 19.42 kg/m.  Treatment Plan Summary: Daily contact with patient to assess and evaluate symptoms and progress in treatment, Medication management and Plan : Patient is seen and examined.  Patient is a 25 year old male with the above-stated past psychiatric history with recently new onset of psychotic symptoms.  He will be admitted to the hospital.  He will be integrated in the milieu.  He will be encouraged to attend groups.  Unfortunately he only slept 2.5 hours last night.  His transfer medications included fluoxetine at 20 mg a day and Zyprexa 5 mg p.o. nightly.  I am going to give him 5 mg this morning, and increase his nighttime dose to 10 mg.  Review of his admission laboratories revealed a mildly low sodium at 134, normal creatinine at 1.21.  His lipid panel showed an elevated cholesterol of 214 and an elevated LDL at 146.  CBC was essentially normal except for a mildly low MCHC at 37.7.  Differential was normal.  His hemoglobin A1c was 4.3.  TSH was normal at 1.422.  Respiratory panel was negative for influenza A, B and coronavirus.  Drug screen was positive for marijuana.  EKG showed a sinus arrhythmia with a QTc interval of 457.  His blood pressure is elevated this morning.  Initially it was 145/100, and then 141/109.  We will monitor his blood pressure after he is medicated this morning to see if this is secondary to psychosis or whether he has intrinsic blood pressure issues.  Observation  Level/Precautions:  15 minute checks  Laboratory:  Chemistry Profile  Psychotherapy:    Medications:    Consultations:    Discharge Concerns:    Estimated LOS:  Other:     Physician Treatment Plan for Primary Diagnosis: <principal problem not specified> Long Term Goal(s): Improvement in symptoms so as ready for discharge  Short Term Goals: Ability to identify changes in lifestyle to reduce recurrence of condition will improve, Ability to verbalize feelings will improve, Ability to disclose and discuss suicidal ideas, Ability to demonstrate self-control will improve, Ability to identify and develop effective coping behaviors will improve, Ability to maintain clinical measurements within normal limits will improve and Ability to identify triggers associated with substance abuse/mental health issues will improve  Physician Treatment Plan for Secondary Diagnosis: Active Problems:   MDD (major depressive disorder), recurrent, severe, with psychosis (HCC)  Long Term Goal(s): Improvement in symptoms so as ready for discharge  Short Term Goals: Ability to identify changes in lifestyle to reduce recurrence of condition will improve, Ability to verbalize feelings will improve, Ability to disclose and discuss suicidal ideas, Ability to demonstrate self-control will improve, Ability to identify and develop effective coping behaviors will improve, Ability to maintain clinical measurements within normal limits will improve and Ability to identify triggers associated with substance abuse/mental health issues will improve  I certify that inpatient services furnished can reasonably be expected to improve the patient's condition.    Antonieta Pert, MD 3/9/20221:00 PM

## 2020-10-27 NOTE — Tx Team (Signed)
Initial Treatment Plan 10/27/2020 3:10 AM Cody Roberts [id number]:[id number]    PATIENT STRESSORS: Marital or family conflict Medication change or noncompliance Substance abuse   PATIENT STRENGTHS: General fund of knowledge Motivation for treatment/growth Supportive family/friends   PATIENT IDENTIFIED PROBLEMS: Psychosis  Risk for Suicide  Depression  "myself, reading history"               DISCHARGE CRITERIA:  Improved stabilization in mood, thinking, and/or behavior Verbal commitment to aftercare and medication compliance  PRELIMINARY DISCHARGE PLAN: Attend aftercare/continuing care group Outpatient therapy  PATIENT/FAMILY INVOLVEMENT: This treatment plan has been presented to and reviewed with the patient, Cody Roberts.  The patient and family have been given the opportunity to ask questions and make suggestions.  Delos Haring, RN 10/27/2020, 3:10 AM

## 2020-10-27 NOTE — Progress Notes (Signed)
Patient ID: Cody Roberts, male   DOB: [birth year], 25 y.o.   MRN: [id number]  Admission Note:  D:24 yr male who presents VC in no acute distress for the treatment of SI and Depression. Pt appears flat and depressed. Pt was calm and cooperative with admission process. Pt presents with passive SI / VH and contracts for safety upon admission. Pt denies AH / Pain at this time .  Per Assessment: Patient reports paranoia that someone is in his home and is watching him/plotting against him. He states "It's like the [employer]. I feel like I'm a target. I notice things around the house that make me believe someone else has been there. I get messages on the computer people about things they would have no way of knowing unless I was being watched. I have delusions people are talking about me." Patient states his paranoia has lasted several weeks but today it was more severe and he felt unsafe, so reached out for help. Patient endorses a history of depression and anxiety. Patient states he stopped seeing a therapist and a psychiatrist when the pandemic started. He states the past year he has been isolating himself in his grandmother's home watching Youtube videos and "staring at screens." He reports fear of being in public or large groups of people. He denies current SI, but does admit to passive thoughts at times. He denies HI. He states he has VH of "things out the corner of my eye." Patient states that for the past year he has been using Delta 8 on a daily basis and uses it all through out the day.   Per collateral: Pt's mother shared that pt's behaviors started 6 - 8 years ago; she states it started after he cheated on a girlfriend and they broke up but then, at age 60, he attempted to go into the military but was denied due to medical concerns. Pt's mother shares pt "went from being a full-time student and working two jobs to not being able to leave the home." Pt's grandmother states she and pt moved into their  home in August 2021 and that, since that time, he has only showered twice. pt has lost 3 relatives in the last year; she shares pt saw his great-grandmother die (she was sick) and that his other great-grandmother, whom he was very close to, died in 11/18/2019. Pt's paternal grandmother died in 08-19-20.  A: Skin was assessed and found to be clear of any abnormal marks apart from a Tattoo L-forearm . PT searched and no contraband found, POC and unit policies explained and understanding verbalized. Consents obtained. Food and fluids offered, and fluids accepted.   R:Pt had no additional questions or concerns.

## 2020-10-27 NOTE — Progress Notes (Signed)
Recreation Therapy Notes  Date: 3.9.22 Time: 1000 Location: 500 Hall Dayroom  Group Topic: Anger Management  Goal Area(s) Addresses:  Patient will identify situations or people that make them angry.  Patient will identify what they do when angry. Patient will identify coping skills to deal with anger.   Behavioral Response: Engaged  Intervention: Conversation with group, and worksheets  Activity: Introduction to Anger Management.  Patients were to identify people, situations or topics that get them angry.  Patients would then identify how they react when angry and what problems those reactions have caused them. Lastly, patients were to come up with 5 positive coping skills to help them deal with anger.  Education: Anger Management, Discharge Planning   Education Outcome: Acknowledges education/In group clarification offered/Needs additional education.   Clinical Observations/Feedback: Pt felt anger could be a bad thing especially if you're angry for no reason or over reacting to something.  Pt explained letting others down, ruining food or not understanding were anger/frustration is coming from makes him angry.  When these things happen, pt stated he yells, throw things or bottles it up.  Pt also expressed his anger has caused him to lose friendships, lose interest in things and jealousy.  Pt identified coping skills as breathing, positive attachments, movies/videos, focusing on something else and finding out what caused the anger.       Caroll Rancher, LRT/CTRS    Caroll Rancher A 10/27/2020 11:41 AM

## 2020-10-27 NOTE — Progress Notes (Signed)
Pt kept to himself in his room much of the evening. Pt continues to be paranoid and suspicious at times.     10/27/20 2100  Psych Admission Type (Psych Patients Only)  Admission Status Voluntary  Psychosocial Assessment  Patient Complaints Anxiety;Suspiciousness;Worrying  Eye Contact Fair  Facial Expression Anxious  Affect Anxious;Depressed  Speech Logical/coherent  Interaction Assertive  Motor Activity Slow  Appearance/Hygiene Disheveled  Behavior Characteristics Anxious  Mood Pleasant;Preoccupied  Aggressive Behavior  Effect No apparent injury  Thought Process  Coherency WDL  Content Paranoia  Delusions Paranoid  Perception Hallucinations  Hallucination Visual  Judgment Impaired  Confusion Mild  Danger to Self  Current suicidal ideation? Passive  Self-Injurious Behavior Some self-injurious ideation observed or expressed.  No lethal plan expressed   Agreement Not to Harm Self Yes  Description of Agreement verbal contract for safety  Danger to Others  Danger to Others None reported or observed

## 2020-10-27 NOTE — Tx Team (Signed)
Interdisciplinary Treatment and Diagnostic Plan Update  10/27/2020 Time of Session: 9:30am  [name] MRN: [id number]  Principal Diagnosis: <principal problem not specified>  Secondary Diagnoses: Active Problems:   MDD (major depressive disorder), recurrent, severe, with psychosis ([name])   Current Medications:  Current Facility-Administered Medications  Medication Dose Route Frequency Provider Last Rate Last Admin  . acetaminophen (TYLENOL) tablet 650 mg  650 mg Oral Q6H PRN Prescilla Sours, PA-C      . alum & mag hydroxide-simeth (MAALOX/MYLANTA) 200-200-20 MG/5ML suspension 30 mL  30 mL Oral Q4H PRN Prescilla Sours, PA-C      . feeding supplement (ENSURE ENLIVE / ENSURE PLUS) liquid 237 mL  237 mL Oral BID BM Margorie John W, PA-C   237 mL at 10/27/20 0928  . FLUoxetine (PROZAC) capsule 20 mg  20 mg Oral Daily Evertt, [place] W, PA-C   20 mg at 10/27/20 0539  . hydrOXYzine (ATARAX/VISTARIL) tablet 25 mg  25 mg Oral TID PRN Prescilla Sours, PA-C      . LORazepam (ATIVAN) tablet 1 mg  1 mg Oral Q6H PRN Sharma Covert, MD      . OLANZapine zydis [facility]) disintegrating tablet 10 mg  10 mg Oral Q8H PRN Sharma Covert, MD       And  . LORazepam (ATIVAN) tablet 1 mg  1 mg Oral PRN Sharma Covert, MD       And  . ziprasidone (GEODON) injection 20 mg  20 mg Intramuscular Q6H PRN Sharma Covert, MD      . magnesium hydroxide (MILK OF MAGNESIA) suspension 30 mL  30 mL Oral Daily PRN Margorie John W, PA-C      . OLANZapine (ZYPREXA) tablet 10 mg  10 mg Oral QHS Sharma Covert, MD      . OLANZapine zydis [facility]) disintegrating tablet 5 mg  5 mg Oral Daily Sharma Covert, MD   5 mg at 10/27/20 0930  . traZODone (DESYREL) tablet 50 mg  50 mg Oral QHS PRN Prescilla Sours, PA-C       PTA Medications: Medications Prior to Admission  Medication Sig Dispense Refill Last Dose  . FLUoxetine (PROZAC) 20 MG capsule Take 1 capsule (20 mg total) by mouth daily. 30 capsule 1   .  OLANZapine (ZYPREXA) 5 MG tablet Take 1 tablet (5 mg total) by mouth at bedtime. 30 tablet 1     Patient Stressors: Marital or family conflict Medication change or noncompliance Substance abuse  Patient Strengths: General fund of knowledge Motivation for treatment/growth Supportive family/friends  Treatment Modalities: Medication Management, Group therapy, Case management,  1 to 1 session with clinician, Psychoeducation, Recreational therapy.   Physician Treatment Plan for Primary Diagnosis: <principal problem not specified> Long Term Goal(s):     Short Term Goals:    Medication Management: Evaluate patient's response, side effects, and tolerance of medication regimen.  Therapeutic Interventions: 1 to 1 sessions, Unit Group sessions and Medication administration.  Evaluation of Outcomes: Not Met  Physician Treatment Plan for Secondary Diagnosis: Active Problems:   MDD (major depressive disorder), recurrent, severe, with psychosis ([name])  Long Term Goal(s):     Short Term Goals:       Medication Management: Evaluate patient's response, side effects, and tolerance of medication regimen.  Therapeutic Interventions: 1 to 1 sessions, Unit Group sessions and Medication administration.  Evaluation of Outcomes: Not Met   RN Treatment Plan for Primary Diagnosis: <principal problem not specified> Long  Term Goal(s): Knowledge of disease and therapeutic regimen to maintain health will improve  Short Term Goals: Ability to remain free from injury will improve, Ability to demonstrate self-control, Ability to participate in decision making will improve, Ability to verbalize feelings will improve, Ability to disclose and discuss suicidal ideas and Ability to identify and develop effective coping behaviors will improve  Medication Management: RN will administer medications as ordered by provider, will assess and evaluate patient's response and provide education to patient for prescribed  medication. RN will report any adverse and/or side effects to prescribing provider.  Therapeutic Interventions: 1 on 1 counseling sessions, Psychoeducation, Medication administration, Evaluate responses to treatment, Monitor vital signs and CBGs as ordered, Perform/monitor CIWA, COWS, AIMS and Fall Risk screenings as ordered, Perform wound care treatments as ordered.  Evaluation of Outcomes: Not Met   LCSW Treatment Plan for Primary Diagnosis: <principal problem not specified> Long Term Goal(s): Safe transition to appropriate next level of care at discharge, Engage patient in therapeutic group addressing interpersonal concerns.  Short Term Goals: Engage patient in aftercare planning with referrals and resources, Increase social support, Increase emotional regulation, Facilitate acceptance of mental health diagnosis and concerns, Identify triggers associated with mental health/substance abuse issues and Increase skills for wellness and recovery  Therapeutic Interventions: Assess for all discharge needs, 1 to 1 time with Social worker, Explore available resources and support systems, Assess for adequacy in community support network, Educate family and significant other(s) on suicide prevention, Complete Psychosocial Assessment, Interpersonal group therapy.  Evaluation of Outcomes: Not Met   Progress in Treatment: Attending groups: Yes. Participating in groups: Yes. Taking medication as prescribed: Yes. Toleration medication: Yes. Family/Significant other contact made: Yes, individual(s) contacted:  Mother  Patient understands diagnosis: No. Discussing patient identified problems/goals with staff: Yes. Medical problems stabilized or resolved: Yes. Denies suicidal/homicidal ideation: Yes. Issues/concerns per patient self-inventory: No.   New problem(s) identified: No, Describe:  None   New Short Term/Long Term Goal(s): medication stabilization, elimination of SI thoughts, development of  comprehensive mental wellness plan.   Patient Goals:  "To get coping skills"   Discharge Plan or Barriers: Patient recently admitted. CSW will continue to follow and assess for appropriate referrals and possible discharge planning.   Reason for Continuation of Hospitalization: Delusions  Medication stabilization  Estimated Length of Stay: 3 to 5 days  Attendees: Patient: Adams Cody Roberts  10/27/2020   Physician: Myles Lipps, MD 10/27/2020   Nursing:  10/27/2020   RN Care Manager: 10/27/2020   Social Worker: Verdis Frederickson, [place] 10/27/2020   Recreational Therapist:  10/27/2020   Other:  10/27/2020   Other:  10/27/2020   Other: 10/27/2020     Scribe for Treatment Team: Darleen Crocker, [place] 10/27/2020 11:49 AM

## 2020-10-27 NOTE — Discharge Instructions (Addendum)
Transfer to [facility] 

## 2020-10-27 NOTE — Progress Notes (Signed)
Recreation Therapy Notes  INPATIENT RECREATION THERAPY ASSESSMENT  Patient Details Name: Cody Roberts MRN: [id number] DOB: August 07, 1996 Today's Date: 10/27/2020       Information Obtained From: Patient  Able to Participate in Assessment/Interview: Yes  Patient Presentation: Alert  Reason for Admission (Per Patient): Other (Comments) (Depression)  Patient Stressors: Other (Comment) ("There was but I was using drugs at the time since I've started my medication it gone now".)  Coping Skills:   Isolation,TV,Sports,Arguments,Music,Exercise,Substance Abuse,Talk,Art,Avoidance,Read,Hot Bath/Shower  Leisure Interests (2+):  Games - Video games,Music - Listen,Individual - TV,Individual - Other (Comment) (Chill with dog; Do something mellow; Watch documentaries/movies)  Frequency of Recreation/Participation: Other (Comment) (Daily)  Awareness of Community Resources:  Yes  Community Resources:  Park  Current Use: Yes  If no, Barriers?:    Expressed Interest in [employer] Information: No  County of Residence:  [place]  Patient Main Form of Transportation: Set designer  Patient Strengths:  "I don't know"  Patient Identified Areas of Improvement:  "Most areas but mostly hygiene and get back on track"  Patient Goal for Hospitalization:  "maintain hygiene and a positive outlook"  Current SI (including self-harm):  No  Current HI:  No  Current AVH: No  Staff Intervention Plan: Group Attendance,Collaborate with Interdisciplinary Treatment Team  Consent to Intern Participation: N/A     Caroll Rancher, LRT/CTRS  Caroll Rancher A 10/27/2020, 12:04 PM

## 2020-10-27 NOTE — ED Provider Notes (Signed)
FBC/OBS ASAP Discharge Summary  Date and Time: 10/27/2020 2:17 AM  Name: Cody Roberts  MRN:  [id number]   Discharge Diagnoses:  Final diagnoses:  Severe episode of recurrent major depressive disorder, with psychotic features (HCC)  Cannabis abuse    Disposition: Patient to be transferred to [facility] for inpatient psychiatric treatment.   Subjective: See HPI from my 10/26/20 [facility] Admission H&P Below:   "Cody Roberts is a 25 year old male with history of MDD and dysthymia who presents to the behavioral health urgent care as a voluntary walk-in for the second time this week. Patient accompanied to the [facility] by his mother and grandmother. Mother and grandmother in [facility] lobby and not present during patient's assessment.    Per chart review:  Patient presented to the behavioral health urgent care with his mother on 10/24/20 for paranoia, hallucinations and delusions.  Patient was admitted to [facility] continuous observation/assessment for further stabilization and treatment overnight.  Patient took one-time dose of Zyprexa 5 mg p.o. and began Prozac 20 mg p.o. daily during this [facility] stay upon reevaluation of the patient on October 25, 2020, patient's symptoms and presentation had significantly improved.  At that time, patient was deemed appropriate for discharge from [facility]. Upon [facility] discharge, prescriptions were sent to the patient's pharmacy for Zyprexa 5 mg p.o. nightly and Prozac 20 mg p.o. daily.  Patient was also provided with outpatient behavioral health resources that were appropriate for his health insurance plan upon [facility] discharge.  Patient presents back to [facility] this evening (10/26/20) for SI with a plan in addition to similar presentation as his prior presentation to [facility] on 10/24/20.  When patient is asked why he decided to return to the [facility], patient states the following statement that it is difficult to follow: "things are happening and I am not able to explain them.  Videos are planning at times when  specific things are being said.  When you see the red light in the video pauses at that moment, I then went to both doors and there was a red light".  Patient endorses active SI on exam with a plan to slit/cut his throat with a knife and patient states that he has been "seeing images of me cutting my throat with a knife" over the past few days.  Patient also reports that he has been hearing a song in his head that "encourages me to commit suicide".  When asked about past suicide attempts, states that "in 2017 I would sometimes drive as fast as possible and then hit the brakes at the last second".  When patient is asked again if he has ever attempted suicide in the past, patient states that he has not attempted suicide in the past.  Patient denies any history of intentionally cutting or burning himself.  Patient denies HI on exam.    Patient endorses AVH for the past 6 months to 1 year.  Patient states that he has been hearing his sister's voices speaking to him recently but he noticed that his sister is not actually there because the house is empty when he hears these voices.  Patient also reports that he has been "seeing objects move past the windows" at home as well as seeing "a flash outside the window" at home.  The patient is asked if he is experiencing any additional visual hallucinations, he states that he has seen "food disappearing and water levels" at home.  When patient is asked to explain these "water levels" more in depth, patient  states that he will remove a water bottle from a certain location in his home, empty the water bottle, throw the water bottle away, and then he will see a new full/unopened bottled water in the same location that he just removed the previous water bottle from.  Patient denies tactile, olfactory, or gustatory hallucinations.  Patient reports being very paranoid "my whole life" but he states that his paranoia has increased over the past 6 months to 1 year.  Patient states  that he thinks that someone is out to get him and that someone is trying to kill him.  Patient endorses delusions of reference, persecutory delusions, and control delusions for the past 6 months to 1 year.  He states that he thinks that people are trying to control his thoughts and actions.  He reports that he "spends too much time on the Internet" and that he will spend all day watching streams on twitch.  Patient states that he thinks that every streamer that he watches on twitch is "speaking to me indirectly" and he states that "all of the streamers I watch are all on the same page and are using the same little jokes to try to get me to leave".  He also states that the twitch streamers he watches online will tell him to do things like "go make food" and that this will make him feel like he has to do what they tell him to do.  Patient also states that when he was watching the news earlier, the people on the news "made it feel like they were talking to me".  Patient denies grandiose delusions, delusions of doubles, or delusions of erotomania.  When patient is asked about jealousy delusions, patient states that he experiences jealousy delusions sometimes and states that he "has trust issues with my family".  Patient states that he only bathes about 1-2 times per month and he states that he often does not brush his teeth.  Patient reports that he has preferred to be alone and socially isolate himself for the past 6 months.  Patient reports that he has been vaping 1 g of delta 8 THC daily for the past year and he does report that he is unsure if the delta 8 use has worsened his mental health symptoms. Patient states he has not drank alcohol since 2019.  He reports that he used to date nicotine but he states that he quit doing this 2 to 3 years ago.  Patient denies any additional drug/substance use.  Patient states that his sleep is fair and that he sleeps about 12 to 15 hours/day.  Patient states that he will  usually sleep through the night, but he does endorse experiencing sleepless nights due to the feeling of a lack of need for sleep occasionally.  Patient states he will often sleep during the day as well.  He endorses anhedonia, as well as feelings of guilt, hopelessness, and worthlessness.  The patient is asked about recent energy changes, he states that his energy is good from the morning to 12 PM, but after 12 PM, he states that his energy declines.  Patient reports that his concentration is fair and denies any recent changes in his concentration.  Patient reports that his appetite has been declining recently and he believes that he has lost at least 10 pounds over the past 3 months.  Patient reports being easily distracted often.  He denies engaging in risky behavior such as driving under the influence or engaging in  excessive shopping sprees.  He endorses flight of ideas, as well as feeling pressured to continue talking to hold a conversation at times.  Patient states that he is not currently seeing a psychiatrist or therapist.  Patient denies ever seeing a psychiatrist in the past.  He does report that he saw a therapist for CBT (patient unable to remember the name of his therapist, but states that the therapist is located on [facility]) from March/April 2021 until August 2021, but he states that his therapy was discontinued due to COVID and he has not seen a therapist since.  Patient also reports that he was previously prescribed Prozac by his primary care provider for depression and that he took the Prozac from January 2020 to March 2020.  Patient states that he stopped taking the Prozac in March 2020 because the dose kept being increased and it was not helping with his mental health symptoms.  Patient states that he has been taking the Zyprexa 5 mg p.o. nightly and Prozac 20 mg p.o. daily that he was prescribed from his March 6-7, 2022 [facility] stay.  He states that he took his Prozac this morning  but he has not taken his evening dose of Zyprexa yet.  Patient denies any additional history of taking psychotropic medications.  He denies any history of receiving inpatient psychiatric treatment in the past.  Patient states that he lives in [place] with his grandmother and he states that his grandmother is "out of the house for weeks at a time because her boyfriend lives at the beach".  Patient denies any access to guns or weapons.  Patient states that he has been unemployed since 2019.  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  He states his highest level of education is completing some college.  On exam, patient is sitting in a chair, appearing paranoid and socially withdrawn, but in no acute distress.  His stated mood is "worried" with noncongruent, flat affect.  He is alert and oriented x4, cooperative, and answers all questions appropriately.  Patient does not appear to be responding to internal or external stimuli at this time on exam.  With patient's consent, information was obtained by myself and Duard Brady, LMFT speaking with the patient's mother Vinie Sill: [phone]) and the patient's grandmother Lendell Caprice: [phone]) in the [facility] lobby.  This obtain collateral information as shown below, as written and Wells Guiles 10/26/20 BH Assessment/CCA note:   "Pt'sprovided verbal consent for clinician and PA to speak with his mother and grandmother. Pt's mother shared that pt's behaviors started 6 - 8 years ago; she states it started after he cheated on a girlfriend and they broke up but then, at age 53, he attempted to go into the military but was denied due to medical concerns. Pt's mother shares pt "went from being a full-time student and working two jobs to not being able to leave the home." Pt's grandmother states she and pt moved into their home in August 2021 and that, since that time, he has only  showered twice. Pt's mother and grandmother share pt won't leave the home and that, when people come by the home, he rarely comes out to see them. Pt's mother acknowledged pt does walk the dog around the yard but that he won't leave the yard; she also shared pt has lost a bunch of weight and only eats approximately 1 meal/day.  Pt's mother shares pt  has lost 3 relatives in the last year; she shares pt saw his great-grandmother die (she was sick) and that his other great-grandmother, whom he was very close to, died in 11-09-2019. Pt's paternal grandmother died in 08/10/2020.  Pt's mother shares pt told her, "I'm having homicidal thoughts." She states pt believes someone has been in the home, has pointed a laser light at him, someone is in the yard spying on him, and that someone comes into the home and opens bottles of water and eats Oreos. She shares that, while his grandmother was at the beach, he sealed a partial cup of coffee and a piece of cake in a Ziplock bag due to the belief that it was poisoned.  Pt's grandmother shares that for 1/2 of January and all of February pt has been having rages or anger every night with his grandmother. She shares pt will come into the kitchen for something and will begin drilling her on the subjects of racism, homosexuality, and religion. She states that pt will yell at her until she "answers correctly," which is saying what he wants her to say. She shares pt has broken bowls of hers by slamming them down and will grab the refrigerator and begin to shake it, stating that he won't stop until she correctly answers him. 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  Patient's mother states that the patient was followed by [facility] in the past for this and had "multiple scans" in the past.  Patient's mother  states that due to the multiple scans showing no growth of the cyst, the patient was cleared at that time and has not been followed for the cyst for several years."  Stay Summary: Patient presented to [facility] voluntarily on 10/26/20. Patient was assessed by myself and inpatient psychiatric treatment was recommended by myself at that time. Per Rosey Bath, [facility], patient was accepted to [facility] for inpatient psychiatric treatment pending a negative COVID PCR test result.  PCR COVID test was ordered at [facility]. Patient was placed in [facility] continuous observation/assessment for further stabilization and treatment while awaiting PCR COVID test results for the patient to be transferred to [facility] to begin his inpatient psychiatric treatment. Patient's PCR COVID test is negative. Thus, patient will be transferred to [facility] to begin his inpatient psychiatric treatment.   Total Time spent with patient: 15 minutes  Past Psychiatric History: Per chart review: MDD without psychotic features, Dysthymia   Past Medical History:  Diagnosis Date  . Chest tightness 04/18/2017  . Dysthymia 04/19/2017   Referral downstairs to behavioral health for counseling, patient is advised on mental health safety precautions, can come to me if he would like to discuss meds  . Eustachian tube dysfunction, bilateral 04/19/2017   Canals clear and tympanic membranes normal, advised OTC Flonase and or second generation antihistamine Claritin/Zyrtec/Allegra or similar generic  . Sinusitis 05/18/2016  . Vision changes 05/18/2016   History reviewed. No pertinent surgical history. Family History: History reviewed. No pertinent family history. Family Psychiatric History: Per Patient: Depression in maternal grandmother, Bipolar Disorder in Maternal Great Grandmother. Patient denies any family history of suicide.  Social History:  Social History   Substance and Sexual Activity  Alcohol Use No     Social History   Substance and Sexual Activity  Drug  Use  Not on file    Social History   Socioeconomic History  . Marital status: Married    Spouse name: Not on file  . Number of children: Not on file  . Years of education: Not on file  . Highest education level: Not on file  Occupational History  . Not on file  Tobacco Use  . Smoking status: Never Smoker  . Smokeless tobacco: Never Used  Vaping Use  . Vaping Use: Every day  Substance and Sexual Activity  . Alcohol use: No  . Drug use: Not on file  . Sexual activity: Not Currently  Other Topics Concern  . Not on file  Social History Narrative  . Not on file   Social Determinants of Health   Financial Resource Strain: Not on file  Food Insecurity: Not on file  Transportation Needs: Not on file  Physical Activity: Not on file  Stress: Not on file  Social Connections: Not on file   SDOH:  SDOH Screenings   Alcohol Screen: Not on file  Depression (IWP8-0): Not on file  Financial Resource Strain: Not on file  Food Insecurity: Not on file  Housing: Not on file  Physical Activity: Not on file  Social Connections: Not on file  Stress: Not on file  Tobacco Use: Low Risk   . Smoking Tobacco Use: Never Smoker  . Smokeless Tobacco Use: Never Used  Transportation Needs: Not on file    Has this patient used any form of tobacco in the last 30 days? (Cigarettes, Smokeless Tobacco, Cigars, and/or Pipes) Prescription not provided because: patient does not use nicotine products.   Current Medications:  Current Facility-Administered Medications  Medication Dose Route Frequency Provider Last Rate Last Admin  . acetaminophen (TYLENOL) tablet 650 mg  650 mg Oral Q6H PRN Jaclyn Shaggy, PA-C      . alum & mag hydroxide-simeth (MAALOX/MYLANTA) 200-200-20 MG/5ML suspension 30 mL  30 mL Oral Q4H PRN Jaclyn Shaggy, PA-C      . FLUoxetine (PROZAC) capsule 20 mg  20 mg Oral Daily Aristotle, Lieb W, PA-C      . hydrOXYzine (ATARAX/VISTARIL) tablet 25 mg  25 mg Oral TID PRN Jaclyn Shaggy,  PA-C      . magnesium hydroxide (MILK OF MAGNESIA) suspension 30 mL  30 mL Oral Daily PRN Jaclyn Shaggy, PA-C      . OLANZapine (ZYPREXA) tablet 5 mg  5 mg Oral QHS Melbourne Abts W, PA-C   5 mg at 10/26/20 2313  . traZODone (DESYREL) tablet 50 mg  50 mg Oral QHS PRN Jaclyn Shaggy, PA-C       Current Outpatient Medications  Medication Sig Dispense Refill  . FLUoxetine (PROZAC) 20 MG capsule Take 1 capsule (20 mg total) by mouth daily. 30 capsule 1  . OLANZapine (ZYPREXA) 5 MG tablet Take 1 tablet (5 mg total) by mouth at bedtime. 30 tablet 1    PTA Medications: (Not in a hospital admission)   Musculoskeletal  Strength & Muscle Tone: within normal limits Gait & Station: normal Patient leans: N/A  Psychiatric Specialty Exam  PSE per my earlier 10/26/20 [facility] Admission H&P:    Presentation  General Appearance: Bizarre; Disheveled  Eye Contact:Fleeting  Speech:Clear and Coherent; Normal Rate  Speech Volume:Normal  Handedness:Right   Mood and Affect  Mood:-- (Worried)  Affect:Non-Congruent; Flat   Thought Process  Thought Processes:Disorganized  Descriptions of Associations:Circumstantial  Orientation:Full (Time, Place and Person)  Thought Content:Delusions; Paranoid Ideation; Scattered  Hallucinations:Hallucinations: Auditory; Visual  Ideas of Reference:Paranoia; Delusions  Suicidal Thoughts:Suicidal Thoughts: Yes, Active SI Active Intent and/or Plan: With Intent; With Plan; With Means to Carry Out; With Access to Means  Homicidal Thoughts:Homicidal Thoughts: No   Sensorium  Memory:Immediate Fair; Recent Fair; Remote Fair  Judgment:Fair  Insight:Fair   Executive Functions  Concentration:Fair  Attention Span:Fair  Recall:Fair  Fund of Knowledge:Fair  Language:Fair   Psychomotor Activity  Psychomotor Activity:Psychomotor Activity: Normal   Assets  Assets:Communication Skills; Desire for Improvement; Financial Resources/Insurance; Housing;  Leisure Time; Physical Health; Social Support; Transportation   Sleep  Sleep:Sleep: Fair Number of Hours of Sleep: 12   Nutritional Assessment (For OBS and FBC admissions only) Has the patient had a weight loss or gain of 10 pounds or more in the last 3 months?: Yes Has the patient had a decrease in food intake/or appetite?: No Does the patient have dental problems?: Yes Does the patient have eating habits or behaviors that may be indicators of an eating disorder including binging or inducing vomiting?: No Has the patient recently lost weight without trying?: Yes, 2-13 lbs. Has the patient been eating poorly because of a decreased appetite?: No Malnutrition Screening Tool Score: 1    Physical Exam/ROS:  Please see my Physical Exam/ROS from my earlier 10/26/20 [facility] Admission H&P Note.   Vitals:  Blood pressure (!) 138/94, pulse 79, temperature 98.5 F (36.9 C), resp. rate 18, SpO2 98 %. There is no height or weight on file to calculate BMI.    Disposition: Per Rosey Bath, [facility], patient is accepted to [facility] for inpatient psychiatric treatment pending a negative COVID PCR test result.  PCR COVID test was ordered at [facility]. Patient was placed in [facility] continuous observation/assessment for further stabilization and treatment while awaiting PCR COVID test results for the patient to be transferred to [facility] to begin his inpatient psychiatric treatment. Patient's PCR COVID test is negative. Thus, patient will be transferred to [facility] to begin his inpatient psychiatric treatment.    Jaclyn Shaggy, PA-C 10/27/2020, 2:17 AM

## 2020-10-27 NOTE — Telephone Encounter (Signed)
Care Management - Follow Up [facility] Discharges   Patient is inpatient at [facility] psychiatric hospital.

## 2020-10-27 NOTE — [facility] Group Notes (Signed)
[facility] LCSW Group Therapy  10/27/2020 1:38 PM  Type of Therapy:  Group Therapy  Participation Level:  Did Not Attend  Summary of Progress/Problems: Did not attend.   Cody Roberts 10/27/2020, 1:38 PM  
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
## 2020-10-27 NOTE — ED Notes (Signed)
Report called to RN Kathlene November, [facility], Pending Safe Transport.
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
## 2020-10-27 NOTE — Progress Notes (Signed)
   10/27/20 0254  Psych Admission Type (Psych Patients Only)  Admission Status Voluntary  Psychosocial Assessment  Patient Complaints Anxiety;Confusion;Depression  Eye Contact Fair  Facial Expression Anxious  Affect Anxious;Depressed  Speech Logical/coherent  Interaction Assertive  Motor Activity Slow  Appearance/Hygiene Disheveled  Behavior Characteristics Appropriate to situation;Cooperative  Mood Pleasant  Aggressive Behavior  Effect No apparent injury  Thought Process  Coherency WDL  Content Paranoia  Delusions Paranoid  Perception Hallucinations  Hallucination Visual  Judgment Impaired  Confusion Mild  Danger to Self  Current suicidal ideation? Passive  Self-Injurious Behavior Some self-injurious ideation observed or expressed.  No lethal plan expressed   Agreement Not to Harm Self Yes  Description of Agreement verbal contract for safety  Danger to Others  Danger to Others None reported or observed

## 2020-10-27 NOTE — Progress Notes (Signed)
NUTRITION ASSESSMENT RD working remotely.   Pt identified as at risk on the Malnutrition Screen Tool  INTERVENTION: - continue Ensure Enlive BID, each supplement provides 350 kcal and 20 grams of protein. [facility] staff to continue to encourage PO intakes of meals and snacks.   NUTRITION DIAGNOSIS: Unintentional weight loss related to sub-optimal intake as evidenced by pt report.   Goal: Pt to meet >/= 90% of their estimated nutrition needs.  Monitor:  PO intake  Assessment:  Patient admitted for MDD with psychosis, paranoia.   Ensure Enlive was ordered BID at the time of admission per ONS protocol and accepted bottle offered to him this AM.   Weight today is 124 lb and PTA the most recently documented weight was on 11/05/18 when he weighed 147 lb. This indicates 23 lb weight loss (15.6% body weight) in the past 2 years; not significant for time frame, but unsure if weight loss has occurred more acutely.    25 y.o. male  Height: Ht Readings from Last 1 Encounters:  10/27/20 5\' 7"  (1.702 m)    Weight: Wt Readings from Last 1 Encounters:  10/27/20 56.2 kg    Weight Hx: Wt Readings from Last 10 Encounters:  10/27/20 56.2 kg  11/05/18 67 kg  10/07/18 69.1 kg  09/05/18 69.7 kg  04/18/17 75.8 kg  05/18/16 67.1 kg (38 %, Z= -0.32)*   * Growth percentiles are based on CDC (Boys, 2-20 Years) data.    BMI:  Body mass index is 19.42 kg/m. Pt meets criteria for normal weight based on current BMI.  Estimated Nutritional Needs: Kcal: 25-30 kcal/kg Protein: > 1 gram protein/kg Fluid: 1 ml/kcal  Diet Order:  Diet Order            Diet regular Room service appropriate? Yes; Fluid consistency: Thin  Diet effective now                Pt is also offered choice of unit snacks mid-morning and mid-afternoon.  Pt is eating as desired.   Lab results and medications reviewed.      05/20/16, MS, RD, LDN, CNSC Inpatient Clinical Dietitian RD pager # available in  [facility]  After hours/weekend pager # available in [facility]

## 2020-10-27 NOTE — BHH Suicide Risk Assessment (Signed)
BHH INPATIENT:  Family/Significant Other Suicide Prevention Education  Suicide Prevention Education:  Contact Attempts: mother Vinie Sill [phone]), has been identified by the patient as the family member/significant other with whom the patient will be residing, and identified as the person(s) who will aid the patient in the event of a mental health crisis.  With written consent from the patient, two attempts were made to provide suicide prevention education, prior to and/or following the patient's discharge.  We were unsuccessful in providing suicide prevention education.  A suicide education pamphlet was given to the patient to share with family/significant other.   Date and time of first attempt: 10/27/2020 at 1:35pm CSW left a HIPAA compliant voicemail for pt's mother. Date and time of second attempt: Second attempt still needs to be made.   Ruthann Cancer MSW, LCSW Clincal Social Worker  [facility]

## 2020-10-28 DIAGNOSIS — F333 Major depressive disorder, recurrent, severe with psychotic symptoms: Secondary | ICD-10-CM

## 2020-10-28 DIAGNOSIS — F19959 Other psychoactive substance use, unspecified with psychoactive substance-induced psychotic disorder, unspecified: Secondary | ICD-10-CM

## 2020-10-28 MED ORDER — OLANZAPINE 5 MG PO TBDP
15.0000 mg | ORAL_TABLET | Freq: Every day | ORAL | Status: DC
  Administered 2020-10-28: 15 mg via ORAL
  Filled 2020-10-28 (×2): qty 3

## 2020-10-28 MED ORDER — OLANZAPINE 5 MG PO TBDP
5.0000 mg | ORAL_TABLET | ORAL | Status: AC
  Administered 2020-10-28: 5 mg via ORAL
  Filled 2020-10-28: qty 1

## 2020-10-28 NOTE — BHH Group Notes (Signed)
Occupational Therapy Group Note Date: 10/28/2020 Group Topic/Focus: Self-Esteem  Group Description: Group encouraged increased engagement and participation through discussion and activity focused on self-esteem. Patients explored and discussed the differences between healthy and low self-esteem and how it affects our daily lives and occupations with a focus on relationships, work, school, self-care, and personal leisure interests. Group discussion then transitioned into identifying specific strategies to boost self-esteem and engaged in a collaborative and independent activity looking at positive ways to describe oneself A-Z.   Therapeutic Goal(s): Understand and recognize the differences between healthy and low self-esteem Identify healthy strategies to improve/build self-esteem Participation Level: Patient did not attend OT group session despite personal invitation. Pt remained in room.   Plan: Continue to engage patient in OT groups 2 - 3x/week.  10/28/2020  Adelisa Satterwhite, MOT, OTR/L 

## 2020-10-28 NOTE — Progress Notes (Signed)
[facility] MD Progress Note  10/28/2020 10:56 AM Cody Roberts  MRN:  [id number] Subjective: Patient is a 25 year old male with a past psychiatric history significant for major depression who presented to the [facility] on 10/24/2020 secondary to paranoid delusions.  Objective: Patient is seen and examined.  Patient is a 25 year old male with the above-stated past psychiatric history who is seen in follow-up.  I am becoming more increasingly concerned that his psychosis may be due to a first break.  He stated that he still having hallucinations.  He stated he still is seeing things through his window.  He stated that these were more clear this morning.  He has had auditory hallucinations as well.  His sleep did improve with switching to Zyprexa, but it really does not appear that had a great deal of an effect on his psychosis.  He denied any suicidal ideation.  No new laboratories.  His blood pressure stable at 134/85 and then repeat was 119/68.  He is tachycardic.  His pulse this morning was between 122 and 143.  Previous EKG on 10/24/2020 showed a sinus rhythm with a sinus arrhythmia and normal QTc interval.  Principal Problem: <principal problem not specified> Diagnosis: Active Problems:   MDD (major depressive disorder), recurrent, severe, with psychosis (HCC)  Total Time spent with patient: 20 minutes  Past Psychiatric History: See admission H&P  Past Medical History:  Past Medical History:  Diagnosis Date  . Chest tightness 04/18/2017  . Dysthymia 04/19/2017   Referral downstairs to behavioral health for counseling, patient is advised on mental health safety precautions, can come to me if he would like to discuss meds  . Eustachian tube dysfunction, bilateral 04/19/2017   Canals clear and tympanic membranes normal, advised OTC Flonase and or second generation antihistamine Claritin/Zyrtec/Allegra or similar generic  . Sinusitis 05/18/2016  . Vision changes 05/18/2016    History reviewed. No pertinent surgical history. Family History: History reviewed. No pertinent family history. Family Psychiatric  History: See admission H&P Social History:  Social History   Substance and Sexual Activity  Alcohol Use No     Social History   Substance and Sexual Activity  Drug Use Yes  . Types: Marijuana   Comment: Delta 8    Social History   Socioeconomic History  . Marital status: Married    Spouse name: Not on file  . Number of children: Not on file  . Years of education: Not on file  . Highest education level: Not on file  Occupational History  . Not on file  Tobacco Use  . Smoking status: Never Smoker  . Smokeless tobacco: Never Used  Vaping Use  . Vaping Use: Every day  . Substances: THC  Substance and Sexual Activity  . Alcohol use: No  . Drug use: Yes    Types: Marijuana    Comment: Delta 8  . Sexual activity: Not Currently  Other Topics Concern  . Not on file  Social History Narrative  . Not on file   Social Determinants of Health   Financial Resource Strain: Not on file  Food Insecurity: Not on file  Transportation Needs: Not on file  Physical Activity: Not on file  Stress: Not on file  Social Connections: Not on file   Additional Social History:                         Sleep: Good  Appetite:  Fair  Current Medications: Current  Facility-Administered Medications  Medication Dose Route Frequency Provider Last Rate Last Admin  . acetaminophen (TYLENOL) tablet 650 mg  650 mg Oral Q6H PRN Jaclyn Shaggy, PA-C      . alum & mag hydroxide-simeth (MAALOX/MYLANTA) 200-200-20 MG/5ML suspension 30 mL  30 mL Oral Q4H PRN Jaclyn Shaggy, PA-C      . feeding supplement (ENSURE ENLIVE / ENSURE PLUS) liquid 237 mL  237 mL Oral BID BM Melbourne Abts W, PA-C   237 mL at 10/27/20 0928  . FLUoxetine (PROZAC) capsule 20 mg  20 mg Oral Daily Lonzo, Saulter W, PA-C   20 mg at 10/28/20 0820  . hydrOXYzine (ATARAX/VISTARIL) tablet 25 mg  25  mg Oral TID PRN Jaclyn Shaggy, PA-C   25 mg at 10/27/20 2050  . LORazepam (ATIVAN) tablet 1 mg  1 mg Oral Q6H PRN Antonieta Pert, MD      . OLANZapine zydis [facility]) disintegrating tablet 10 mg  10 mg Oral Q8H PRN Antonieta Pert, MD       And  . LORazepam (ATIVAN) tablet 1 mg  1 mg Oral PRN Antonieta Pert, MD       And  . ziprasidone (GEODON) injection 20 mg  20 mg Intramuscular Q6H PRN Antonieta Pert, MD      . magnesium hydroxide (MILK OF MAGNESIA) suspension 30 mL  30 mL Oral Daily PRN Melbourne Abts W, PA-C      . OLANZapine (ZYPREXA) tablet 10 mg  10 mg Oral QHS Antonieta Pert, MD   10 mg at 10/27/20 2050  . OLANZapine zydis (ZYPREXA) disintegrating tablet 5 mg  5 mg Oral Daily Antonieta Pert, MD   5 mg at 10/28/20 3267  . traZODone (DESYREL) tablet 50 mg  50 mg Oral QHS PRN Tycen, Dockter, PA-C   50 mg at 10/27/20 2050    Lab Results:  Results for orders placed or performed during the hospital encounter of 10/26/20 (from the past 48 hour(s))  Resp Panel by RT-PCR (Flu A&B, Covid) Nasopharyngeal Swab     Status: None   Collection Time: 10/26/20 10:41 PM   Specimen: Nasopharyngeal Swab; Nasopharyngeal(NP) swabs in vial transport medium  Result Value Ref Range   SARS Coronavirus 2 by RT PCR NEGATIVE NEGATIVE    Comment: (NOTE) SARS-CoV-2 target nucleic acids are NOT DETECTED.  The SARS-CoV-2 RNA is generally detectable in upper respiratory specimens during the acute phase of infection. The lowest concentration of SARS-CoV-2 viral copies this assay can detect is 138 copies/mL. A negative result does not preclude SARS-Cov-2 infection and should not be used as the sole basis for treatment or other patient management decisions. A negative result may occur with  improper specimen collection/handling, submission of specimen other than nasopharyngeal swab, presence of viral mutation(s) within the areas targeted by this assay, and inadequate number of  viral copies(<138 copies/mL). A negative result must be combined with clinical observations, patient history, and epidemiological information. The expected result is Negative.  Fact Sheet for Patients:  BloggerCourse.com  Fact Sheet for Healthcare Providers:  SeriousBroker.it  This test is no t yet approved or cleared by the [place] FDA and  has been authorized for detection and/or diagnosis of SARS-CoV-2 by FDA under an Emergency Use Authorization (EUA). This EUA will remain  in effect (meaning this test can be used) for the duration of the COVID-19 declaration under Section 564(b)(1) of the Act, 21 U.S.C.section 360bbb-3(b)(1), unless the authorization is terminated  or revoked sooner.       Influenza A by PCR NEGATIVE NEGATIVE   Influenza B by PCR NEGATIVE NEGATIVE    Comment: (NOTE) The Xpert Xpress SARS-CoV-2/FLU/RSV plus assay is intended as an aid in the diagnosis of influenza from Nasopharyngeal swab specimens and should not be used as a sole basis for treatment. Nasal washings and aspirates are unacceptable for Xpert Xpress SARS-CoV-2/FLU/RSV testing.  Fact Sheet for Patients: BloggerCourse.com  Fact Sheet for Healthcare Providers: SeriousBroker.it  This test is not yet approved or cleared by the [place] FDA and has been authorized for detection and/or diagnosis of SARS-CoV-2 by FDA under an Emergency Use Authorization (EUA). This EUA will remain in effect (meaning this test can be used) for the duration of the COVID-19 declaration under Section 564(b)(1) of the Act, 21 U.S.C. section 360bbb-3(b)(1), unless the authorization is terminated or revoked.  Performed at [facility], [address] [place], [place] [address]   POC SARS Coronavirus 2 Ag-ED - Nasal Swab     Status: None (Preliminary result)   Collection Time: 10/26/20 10:41 PM  Result  Value Ref Range   SARS Coronavirus 2 Ag Negative Negative  POC SARS Coronavirus 2 Ag     Status: None   Collection Time: 10/26/20 10:55 PM  Result Value Ref Range   SARS Coronavirus 2 Ag NEGATIVE NEGATIVE    Comment: (NOTE) SARS-CoV-2 antigen NOT DETECTED.   Negative results are presumptive.  Negative results do not preclude SARS-CoV-2 infection and should not be used as the sole basis for treatment or other patient management decisions, including infection  control decisions, particularly in the presence of clinical signs and  symptoms consistent with COVID-19, or in those who have been in contact with the virus.  Negative results must be combined with clinical observations, patient history, and epidemiological information. The expected result is Negative.  Fact Sheet for Patients: https://www.jennings-kim.com/  Fact Sheet for Healthcare Providers: https://alexander-rogers.biz/  This test is not yet approved or cleared by the [place] FDA and  has been authorized for detection and/or diagnosis of SARS-CoV-2 by FDA under an Emergency Use Authorization (EUA).  This EUA will remain in effect (meaning this test can be used) for the duration of  the COV ID-19 declaration under Section 564(b)(1) of the Act, 21 U.S.C. section 360bbb-3(b)(1), unless the authorization is terminated or revoked sooner.      Blood Alcohol level:  No results found for: [facility]  Metabolic Disorder Labs: Lab Results  Component Value Date   HGBA1C 4.3 (L) 10/24/2020   MPG 76.71 10/24/2020   No results found for: PROLACTIN Lab Results  Component Value Date   CHOL 214 (H) 10/24/2020   TRIG 57 10/24/2020   HDL 57 10/24/2020   CHOLHDL 3.8 10/24/2020   VLDL 11 10/24/2020   LDLCALC 146 (H) 10/24/2020    Physical Findings: AIMS:  , ,  ,  ,    CIWA:    COWS:     Musculoskeletal: Strength & Muscle Tone: within normal limits Gait & Station: normal Patient leans:  N/A  Psychiatric Specialty Exam:  Presentation  General Appearance: Appropriate for Environment  Eye Contact:Fair  Speech:Normal Rate  Speech Volume:Decreased  Handedness:Right   Mood and Affect  Mood:Anxious; Dysphoric  Affect:Flat   Thought Process  Thought Processes:Goal Directed  Descriptions of Associations:Loose  Orientation:Full (Time, Place and Person)  Thought Content:Delusions; Paranoid Ideation  History of Schizophrenia/Schizoaffective disorder:No  Duration of Psychotic Symptoms:Less than six months  Hallucinations:Hallucinations: Auditory; Visual  Ideas of Reference:Delusions; Paranoia  Suicidal Thoughts:Suicidal Thoughts: No SI Active Intent and/or Plan: Without Intent; Without Plan SI Passive Intent and/or Plan: Without Intent; Without Plan  Homicidal Thoughts:Homicidal Thoughts: No   Sensorium  Memory:Immediate Fair; Recent Fair; Remote Fair  Judgment:Fair  Insight:Fair   Executive Functions  Concentration:Fair  Attention Span:Fair  Recall:Fair  Fund of Knowledge:Fair  Language:Fair   Psychomotor Activity  Psychomotor Activity:Psychomotor Activity: Decreased   Assets  Assets:Desire for Improvement; Resilience   Sleep  Sleep:Sleep: Good Number of Hours of Sleep: 7    Physical Exam: Physical Exam Vitals and nursing note reviewed.  HENT:     Head: Normocephalic and atraumatic.  Pulmonary:     Effort: Pulmonary effort is normal.  Neurological:     General: No focal deficit present.     Mental Status: He is alert and oriented to person, place, and time.    ROS Blood pressure 119/68, pulse (!) 143, temperature 98.4 F (36.9 C), temperature source Oral, resp. rate 18, height 5\' 7"  (1.702 m), weight 56.2 kg, SpO2 97 %. Body mass index is 19.42 kg/m.   Treatment Plan Summary: Daily contact with patient to assess and evaluate symptoms and progress in treatment, Medication management and Plan : Patient is seen and  examined.  Patient is a 25 year old male with the above-stated past psychiatric history who is seen in follow-up.   Diagnosis: 1.  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 2.  Cannabis use disorder 3.  History of major depression  Pertinent findings on examination today: 1.  Hallucinations both visual and auditory persist. 2.  Continued paranoia. 3.  Sleep is improved. 4.  Denies suicidal ideation.  Plan: 1.  Continue fluoxetine 20 mg p.o. daily for depression and anxiety. 2.  Continue lorazepam 1 mg p.o. every 6 hours as needed anxiety and agitation. 3.  Increase olanzapine Zydis to 5 mg p.o. daily and 15 mg p.o. nightly for psychosis. 4.  Continue trazodone 50 mg p.o. nightly as needed insomnia. 5.  Disposition planning-in progress.  Antonieta PertGreg Lawson Milanna Kozlov, MD 10/28/2020, 10:56 AM

## 2020-10-28 NOTE — [facility] Suicide Risk Assessment (Signed)
[facility] INPATIENT:  Family/Significant Other Suicide Prevention Education  Suicide Prevention Education: Education Completed; mother Cody Roberts [phone]), has been identified by the patient as the family member/significant other with whom the patient will be residing, and identified as the person(s) who will aid the patient in the event of a mental health crisis (suicidal ideations/suicide attempt).  With written consent from the patient, the family member/significant other has been provided the following suicide prevention education, prior to the and/or following the discharge of the patient.  The suicide prevention education provided includes the following:  Suicide risk factors  Suicide prevention and interventions  National Suicide Hotline telephone number  [facility] assessment telephone number  [facility] Emergency Assistance 911  [facility] and/or Residential Mobile Crisis Unit telephone number   Request made of family/significant other to:  Remove weapons (e.g., guns, rifles, knives), all items previously/currently identified as safety concern.    Remove drugs/medications (over-the-counter, prescriptions, illicit drugs), all items previously/currently identified as a safety concern.   The family member/significant other verbalizes understanding of the suicide prevention education information provided.  The family member/significant other agrees to remove the items of safety concern listed above.  CSW spoke with this patients mother who had questions about this patients diagnosis, treatment, and discharge plans.   Pt's mother stated this patient is able to return to stay with his grandmother at discharge, however she needs to know he will continue to follow up with outpatient services.    Ruthann Cancer MSW, LCSW Clincal Social Worker  [facility]

## 2020-10-28 NOTE — Progress Notes (Signed)
Recreation Therapy Notes  Date: 3.10.22 Time: 0950 Location: [address] Dayroom   Group Topic: DBT Acceptance and Change  Goal Area(s) Addresses: Patient will successfully practice self-awareness and reflect on current values, lifestyle, and habits.   Patient will identify how skills learned during activity can be used to reach post d/c goals and make healthy changes.    Behavioral Response: Active, Engaged  Intervention: Drawing and Labeling    Activity: My DBT House. LRT described to the group what it entailed and the goal was to promote self-awareness and reflection. LRT gave each person a diagram of a house and wrote what each section of the house represented on the board.  Sections and labels included:        Foundation- Values that govern their life       Walls- People and things that support them through the day to day       Door- Things they hide from others        Basement- Behaviors they are trying to gain control of or areas of their life they want to change       1st Floor- Emotions they want to experience more often, more fully, or in a healthier way       2nd Floor- List of all the things they are happy about or want to feel happy about       3rd Floor/Attic- List of what a "life worth living" would look like for them       Roof- People or factors that protect them       Chimney- Challenging emotions and triggers they experience       Smoke- Ways they "blow off steam"      Yard Sign- Things they are proud of and want others to see       Sunshine- What brings you joy  Patients were instructed to complete this with realistic answers. Patients were offered debriefing on the activity and encouraged to speak on areas they like about what they listed and what they want to see change within their diagram post discharge.   Education: Healthy Coping, Support Systems, Change, Discharge Planning  Education Outcome: Acknowledges education   Clinical  Observations/Feedback: Pt was quiet but engaged.  Pt didn't have anything to represent the foundation; the walls represent her mom, grandma, siblings, family and friends; basement was for anxiety, anger, low ambition, apathy, worthlessness and sadness; the door was past trauma, negative experiences and inability to communicate feelings, 1st floor represented happiness and ability to understand emotions; 2nd floor represented being alive, mother, positive interactions with people, Internet; attic represented self sustainability and make enough for a living; roof represented frustration, loneliness and confusion; yard sign was for siblings, dog and school accomplishments; sunshine was for the dog, learning new things and perspective; and the smoke represented venting, internalizing and breathing techniques.  Pt stated everything he identified on the house during the exercise represented who is.    Caroll Rancher, LRT/CTRS     Caroll Rancher A 10/28/2020 11:23 AM

## 2020-10-28 NOTE — Progress Notes (Signed)
   10/28/20 0500  Sleep  Number of Hours 7.5

## 2020-10-28 NOTE — Progress Notes (Addendum)
Pt  continues to isolate to his room with minimal interaction on the unit Pt given PRN Vistaril and Trazodone per [facility] with HS medication    10/28/20 2000  Psych Admission Type (Psych Patients Only)  Admission Status Voluntary  Psychosocial Assessment  Patient Complaints Substance abuse;Anxiety  Eye Contact Fair  Facial Expression Anxious  Affect Appropriate to circumstance  Speech Logical/coherent  Interaction Assertive  Motor Activity Slow  Appearance/Hygiene Disheveled  Behavior Characteristics Cooperative  Mood Suspicious;Anxious  Aggressive Behavior  Effect No apparent injury  Thought Process  Coherency WDL  Content Paranoia  Delusions Paranoid  Perception Hallucinations  Hallucination Visual ("I saw a TV in my room this morning when I woke up")  Judgment Impaired  Confusion Mild  Danger to Self  Current suicidal ideation? Denies  Self-Injurious Behavior No self-injurious ideation or behavior indicators observed or expressed   Agreement Not to Harm Self Yes  Description of Agreement verbally contract for safety  Danger to Others  Danger to Others None reported or observed

## 2020-10-28 NOTE — Progress Notes (Addendum)
Pt observed to be isolative to room most of this shift. Out of room for meals and medications, noted in dayroom watching TV at brief interval. Pt presents guarded with flat affect and is superficial on interactions. Denies SI, HI, AH and pain. However, pt observed whispering to self, responding to internal stimuli while going to his room from med window. Endorsed visual hallucinations "I saw a TV on the wall this morning when I woke up. I looked twice and it was gone. I know it wasn't real". Pt did not attend scheduled groups despite multiple prompts "I will be there" and did not show up. Rates his depression 2/10 and anxiety 0/10. Safety checks maintained at Q 15 minutes intervals without self harm gestures or outburst thus far. All medications given with verbal education and effects monitored. Emotional support, encouragement and reassurance offered to pt this shift.  Tolerates meals, fluids and medications well without discomfort. Pt denies concerns at this time.

## 2020-10-29 DIAGNOSIS — F19951 Other psychoactive substance use, unspecified with psychoactive substance-induced psychotic disorder with hallucinations: Secondary | ICD-10-CM

## 2020-10-29 DIAGNOSIS — F2081 Schizophreniform disorder: Secondary | ICD-10-CM

## 2020-10-29 MED ORDER — ZIPRASIDONE MESYLATE 20 MG IM SOLR
20.0000 mg | Freq: Four times a day (QID) | INTRAMUSCULAR | Status: DC | PRN
  Administered 2020-10-29 – 2020-11-01 (×2): 20 mg via INTRAMUSCULAR
  Filled 2020-10-29 (×2): qty 20

## 2020-10-29 MED ORDER — LORAZEPAM 1 MG PO TABS
ORAL_TABLET | ORAL | Status: AC
  Administered 2020-10-29: 2 mg
  Filled 2020-10-29: qty 2

## 2020-10-29 MED ORDER — RISPERIDONE 2 MG PO TBDP
4.0000 mg | ORAL_TABLET | Freq: Every day | ORAL | Status: DC
  Filled 2020-10-29 (×2): qty 2

## 2020-10-29 MED ORDER — LORAZEPAM 1 MG PO TABS
2.0000 mg | ORAL_TABLET | Freq: Four times a day (QID) | ORAL | Status: DC | PRN
  Administered 2020-10-29 – 2020-10-31 (×2): 2 mg via ORAL
  Filled 2020-10-29 (×4): qty 2

## 2020-10-29 MED ORDER — HALOPERIDOL LACTATE 5 MG/ML IJ SOLN
10.0000 mg | Freq: Three times a day (TID) | INTRAMUSCULAR | Status: DC
  Filled 2020-10-29 (×2): qty 2

## 2020-10-29 MED ORDER — HALOPERIDOL 5 MG PO TABS
10.0000 mg | ORAL_TABLET | Freq: Three times a day (TID) | ORAL | Status: DC | PRN
  Administered 2020-10-30: 10 mg via ORAL
  Filled 2020-10-29: qty 2

## 2020-10-29 MED ORDER — RISPERIDONE 3 MG PO TBDP
3.0000 mg | ORAL_TABLET | Freq: Two times a day (BID) | ORAL | Status: DC
Start: 2020-10-29 — End: 2020-10-29
  Administered 2020-10-29: 3 mg via ORAL
  Filled 2020-10-29 (×5): qty 1

## 2020-10-29 MED ORDER — LORAZEPAM 1 MG PO TABS: 2.0000 mg | ORAL_TABLET | Freq: Four times a day (QID) | ORAL | Status: DC | PRN

## 2020-10-29 MED ORDER — RISPERIDONE 2 MG PO TBDP
2.0000 mg | ORAL_TABLET | Freq: Three times a day (TID) | ORAL | Status: DC | PRN
  Administered 2020-10-29: 2 mg via ORAL
  Filled 2020-10-29: qty 1

## 2020-10-29 MED ORDER — HALOPERIDOL 5 MG PO TABS
10.0000 mg | ORAL_TABLET | Freq: Three times a day (TID) | ORAL | Status: DC
  Filled 2020-10-29 (×2): qty 2

## 2020-10-29 MED ORDER — RISPERIDONE 2 MG PO TBDP
3.0000 mg | ORAL_TABLET | Freq: Every day | ORAL | Status: DC
  Administered 2020-10-30: 3 mg via ORAL
  Filled 2020-10-29 (×3): qty 1

## 2020-10-29 MED ORDER — HALOPERIDOL LACTATE 5 MG/ML IJ SOLN
10.0000 mg | Freq: Three times a day (TID) | INTRAMUSCULAR | Status: DC | PRN
  Administered 2020-10-29: 10 mg via INTRAMUSCULAR

## 2020-10-29 MED ORDER — HALOPERIDOL LACTATE 5 MG/ML IJ SOLN
10.0000 mg | Freq: Once | INTRAMUSCULAR | Status: AC
  Administered 2020-10-29: 10 mg via INTRAMUSCULAR
  Filled 2020-10-29: qty 2

## 2020-10-29 MED ORDER — LORAZEPAM 1 MG PO TABS
2.0000 mg | ORAL_TABLET | Freq: Once | ORAL | Status: AC
  Administered 2020-10-29: 2 mg via ORAL

## 2020-10-29 MED ORDER — HALOPERIDOL 5 MG PO TABS
10.0000 mg | ORAL_TABLET | Freq: Once | ORAL | Status: AC
  Filled 2020-10-29: qty 2

## 2020-10-29 MED ORDER — HALOPERIDOL LACTATE 5 MG/ML IJ SOLN
INTRAMUSCULAR | Status: AC
  Administered 2020-10-29: 10 mg
  Filled 2020-10-29: qty 2

## 2020-10-29 NOTE — Progress Notes (Signed)
Pt started on 1:1 observation for safety related to active AVH and tactile hallucinations. Pt found naked on bathroom floor picking at the floor, yelling and pointing at the wall. Attempted to pull air condition unit off wall. Pt became increasing agitated resisting to get dressed and combative with staff. Verbal redirections ineffective at this time. PRN Geodon 20 IM given.  Continued support and reassurance provided to pt. Assigned staff in attendance at all times.  Pt continue to pace in his room at this time.

## 2020-10-29 NOTE — Progress Notes (Signed)
Pt presents disorganized, tangential, confused and intrusive towards other on interactions. Actively responding to internal stimuli, talking and pointing to unseen others "Just because I look like him, that's not me. You don't have to put that radioactive device in me. Get him now". Verbal redirections ineffective at this time as pt continues to escalate in his behavior. Observed pushing medication door and going to back exit door to elope off unit. Required multiple prompts to get out of peer's rooms, irritable / agitated when told to come out.  Scheduled and PRN (Zydis 10 mg) medications given with no effect. Support and encouragement provided to pt. Safety checks maintained.  Pt non-receptive to care. Declined lunch when offered. Remains safe on unit.

## 2020-10-29 NOTE — Progress Notes (Signed)
[facility]  10/29/2020 11:24 AM Cody Roberts  MRN:  [id number] Subjective: Patient is a 25 year old male with a past psychiatric history significant for major depression [facility] secondary to paranoid delusions.  Objective: Patient is seen and examined.  Patient is a 25 year old male with a probable past psychiatric history significant for schizophrenia who is seen in follow-up.  He is much worse today.  He is very disorganized, very paranoid, still having auditory and visual hallucinations.  Last night a new patient was admitted and they put him in the room with this patient.  He was significantly paranoid and became agitated.  He only got 4.5 hours last night.  He is so disorganized this morning that is very difficult to understand anything that he saying.  We also had difficulty getting him out of the office.  The plan after hearing this information was to change his antipsychotic medications.  He had been on Zyprexa, and received 15 mg p.o. nightly last night, but actually did poorly with that.  I was going to switch him to Risperdal, but also after seeing him this morning and seeing how paranoid he currently is I wrote for 10 mg Haldol dose now either p.o. or IM.  His blood pressure remains mildly elevated.  This morning is 141/98 and repeat is 147/101.  Pulse was between 81 and 117.  Temperature was afebrile.  Pulse oximetry was 98% on room air.  Nursing notes reflect he slept 4.5 hours last night.  I did speak to his mother yesterday, and she provided some additional information.  She wanted to make sure that his history of arachnoid cyst was known about.  She was not sure if that would contribute to this.  When I asked about his psychiatric history she described what was going on as "this episode", but apparently these types of things have been going on for some time.  She stated that he almost never leaves his grandmother's  house, and it was unlikely that he had been smoking any marijuana.  She stated he does do CBD oil, and has smoked marijuana in the past, but she does not believe that that is contributed to anything.  She did admit that she was aware of the delta 8 usage and we discussed the fact about the potential for psychosis from that.  No new laboratories.   Principal Problem: <principal problem not specified> Diagnosis: Active Problems:   MDD (major depressive disorder), recurrent, severe, with psychosis (HCC)  Total Time spent with patient: 20 minutes  Past Psychiatric History: See admission H&P  Past Medical History:  Past Medical History:  Diagnosis Date  . Chest tightness 04/18/2017  . Dysthymia 04/19/2017   Referral downstairs to behavioral health for counseling, patient is advised on mental health safety precautions, can come to me if he would like to discuss meds  . Eustachian tube dysfunction, bilateral 04/19/2017   Canals clear and tympanic membranes normal, advised OTC Flonase and or second generation antihistamine Claritin/Zyrtec/Allegra or similar generic  . Sinusitis 05/18/2016  . Vision changes 05/18/2016   History reviewed. No pertinent surgical history. Family History: History reviewed. No pertinent family history. Family Psychiatric  History: See admission H&P Social History:  Social History   Substance and Sexual Activity  Alcohol Use No     Social History   Substance and Sexual Activity  Drug Use Yes  . Types: Marijuana   Comment: Delta 8  Social History   Socioeconomic History  . Marital status: Married    Spouse name: Not on file  . Number of children: Not on file  . Years of education: Not on file  . Highest education level: Not on file  Occupational History  . Not on file  Tobacco Use  . Smoking status: Never Smoker  . Smokeless tobacco: Never Used  Vaping Use  . Vaping Use: Every day  . Substances: THC  Substance and Sexual Activity  . Alcohol use: No   . Drug use: Yes    Types: Marijuana    Comment: Delta 8  . Sexual activity: Not Currently  Other Topics Concern  . Not on file  Social History Narrative  . Not on file   Social Determinants of Health   Financial Resource Strain: Not on file  Food Insecurity: Not on file  Transportation Needs: Not on file  Physical Activity: Not on file  Stress: Not on file  Social Connections: Not on file   Additional Social History:                         Sleep: Fair  Appetite:  Fair  Current Medications: Current Facility-Administered Medications  Medication Dose Route Frequency Provider Last Rate Last Admin  . acetaminophen (TYLENOL) tablet 650 mg  650 mg Oral Q6H PRN Jaclyn Shaggy, PA-C      . alum & mag hydroxide-simeth (MAALOX/MYLANTA) 200-200-20 MG/5ML suspension 30 mL  30 mL Oral Q4H PRN Jaclyn Shaggy, PA-C      . feeding supplement (ENSURE ENLIVE / ENSURE PLUS) liquid 237 mL  237 mL Oral BID BM Melbourne Abts W, PA-C   237 mL at 10/29/20 1000  . FLUoxetine (PROZAC) capsule 20 mg  20 mg Oral Daily Hayes, Czaja W, PA-C   20 mg at 10/29/20 0758  . hydrOXYzine (ATARAX/VISTARIL) tablet 25 mg  25 mg Oral TID PRN Jaclyn Shaggy, PA-C   25 mg at 10/29/20 1000  . LORazepam (ATIVAN) tablet 1 mg  1 mg Oral Q6H PRN Antonieta Pert, MD   1 mg at 10/29/20 0119  . OLANZapine zydis (ZYPREXA) disintegrating tablet 10 mg  10 mg Oral Q8H PRN Antonieta Pert, MD   10 mg at 10/29/20 0815   And  . LORazepam (ATIVAN) tablet 1 mg  1 mg Oral PRN Antonieta Pert, MD       And  . ziprasidone (GEODON) injection 20 mg  20 mg Intramuscular Q6H PRN Antonieta Pert, MD      . magnesium hydroxide (MILK OF MAGNESIA) suspension 30 mL  30 mL Oral Daily PRN Melbourne Abts W, PA-C      . risperiDONE (RISPERDAL M-TABS) disintegrating tablet 3 mg  3 mg Oral BID Antonieta Pert, MD   3 mg at 10/29/20 1042  . traZODone (DESYREL) tablet 50 mg  50 mg Oral QHS PRN Moua, Rasmusson, PA-C   50 mg at  10/28/20 2056    Lab Results: No results found for this or any previous visit (from the past 48 hour(s)).  Blood Alcohol level:  No results found for: [facility]  Metabolic Disorder Labs: Lab Results  Component Value Date   HGBA1C 4.3 (L) 10/24/2020   MPG 76.71 10/24/2020   No results found for: PROLACTIN Lab Results  Component Value Date   CHOL 214 (H) 10/24/2020   TRIG 57 10/24/2020   HDL 57 10/24/2020   CHOLHDL  3.8 10/24/2020   VLDL 11 10/24/2020   LDLCALC 146 (H) 10/24/2020    Physical Findings: AIMS: Facial and Oral Movements Muscles of Facial Expression: None, normal Lips and Perioral Area: None, normal Jaw: None, normal Tongue: None, normal,Extremity Movements Upper (arms, wrists, hands, fingers): None, normal Lower (legs, knees, ankles, toes): None, normal, Trunk Movements Neck, shoulders, hips: None, normal, Overall Severity Severity of abnormal movements (highest score from questions above): None, normal Incapacitation due to abnormal movements: None, normal Patient's awareness of abnormal movements (rate only patient's report): No Awareness, Dental Status Current problems with teeth and/or dentures?: No Does patient usually wear dentures?: No  CIWA:    COWS:     Musculoskeletal: Strength & Muscle Tone: within normal limits Gait & Station: normal Patient leans: N/A  Psychiatric Specialty Exam:  Presentation  General Appearance: Bizarre  Eye Contact:Minimal  Speech:Garbled  Speech Volume:Decreased  Handedness:Right   Mood and Affect  Mood:Dysphoric; Anxious  Affect:Inappropriate   Thought Process  Thought Processes:Disorganized  Descriptions of Associations:Intact  Orientation:Partial  Thought Content:Delusions; Illogical; Paranoid Ideation; Scattered  History of Schizophrenia/Schizoaffective disorder:No  Duration of Psychotic Symptoms:Less than six months  Hallucinations:Hallucinations: Auditory; Visual  Ideas of  Reference:Delusions; Paranoia  Suicidal Thoughts:Suicidal Thoughts: No SI Active Intent and/or Plan: Without Intent; Without Plan SI Passive Intent and/or Plan: Without Intent; Without Plan  Homicidal Thoughts:Homicidal Thoughts: No   Sensorium  Memory:Immediate Poor; Recent Poor; Remote Poor  Judgment:Impaired  Insight:Lacking   Executive Functions  Concentration:Poor  Attention Span:Poor  Recall:Poor  Fund of Knowledge:Poor  Language:Poor   Psychomotor Activity  Psychomotor Activity:Psychomotor Activity: Increased   Assets  Assets:Desire for Improvement; Social Support; Resilience   Sleep  Sleep:Sleep: Poor Number of Hours of Sleep: 4.5    Physical Exam: Physical Exam Vitals and nursing note reviewed.  HENT:     Head: Normocephalic and atraumatic.  Pulmonary:     Effort: Pulmonary effort is normal.  Neurological:     General: No focal deficit present.     Mental Status: He is alert.    ROS Blood pressure (!) 147/101, pulse (!) 117, temperature 97.8 F (36.6 C), temperature source Oral, resp. rate 18, height 5\' 7"  (1.702 m), weight 56.2 kg, SpO2 98 %. Body mass index is 19.42 kg/m.   Treatment Plan Summary: Daily contact with patient to assess and evaluate symptoms and progress in treatment, Medication management and Plan : Patient is seen and examined.  Patient is a 25 year old male with the above-stated past psychiatric history who is seen in follow-up.   Diagnosis: 1.  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  Cannabis use disorder versus cannabis dependence  Pertinent findings on examination today: 1.  He is significantly more paranoid, more agitated, more delusional than yesterday.  Plan: 1.  Stop Zyprexa. 2.  Start Risperdal 3 mg p.o. twice daily for psychosis and mood stability. 3.  Give Haldol 10 mg p.o. or IM x1 now. 4.  Stop fluoxetine. 5.  Change agitation protocol to the Risperdal based form. 6.   Haldol 10 mg p.o. or IM 3 times daily as needed agitation. 7.  Continue lorazepam 1 mg p.o. every 6 hours as needed agitation. 8.  Continue to monitor blood pressure and tachycardia. 9.  Mother is concerned that his previous arachnoid cyst may be contributing to the current situation.  Review of the electronic medical record revealed an imaging study from 2005.  There was note of a right middle cranial fossa temporal CSF density lesion measuring 23  x 38 mm medially.  This was consistent with an arachnoid cyst with an adjacent mass-effect on the temporal lobe.  Once we get his psychosis under better control we will attempt to reimage him in case it has gotten larger and causing some degree of compression. 10.  Disposition planning-in progress.  Antonieta Pert, MD 10/29/2020, 11:24 AM

## 2020-10-29 NOTE — Progress Notes (Signed)
Nursing 1:1 note D:Pt observed lying in bed with eyes closed. RR even and unlabored. No distress noted. A: 1:1 observation continues for safety  R: pt remains safe  

## 2020-10-29 NOTE — BHH Group Notes (Signed)
LCSW Group Therapy Note  Type of Therapy/Topic: Group Therapy: Six Dimensions of Wellness  Participation Level: Did Not Attend  Description of Group: This group will address the concept of wellness and the six concepts of wellness: occupational, physical, social, intellectual, spiritual, and emotional. Patients will be encouraged to process areas in their lives that are out of balance and identify reasons for remaining unbalanced. Patients will be encouraged to explore ways to practice healthy habits daily to attain better physical and mental health outcomes.  Therapeutic Goals: 1. Identify aspects of wellness that they are doing well. 2. Identify aspects of wellness that they would like to improve upon. 3. Identify one action they can take to improve an aspect of wellness in their lives.   Summary of Patient Progress: Pt was invited to attend, however declined.      Therapeutic Modalities: Cognitive Behavioral Therapy Solution-Focused Therapy Relapse Prevention

## 2020-10-29 NOTE — Progress Notes (Signed)
Pt woke up stating his Grandmother was in distress because he saw it. He knew she was texting. Pt given 1 mg Ativan per [facility]

## 2020-10-29 NOTE — Progress Notes (Signed)
Pt up multiple times through the night, pt acting bizarre delusional and paranoid. Pt given PRN Vistaril, Trazodone, Ativan per [facility]

## 2020-10-29 NOTE — Progress Notes (Signed)
Pt got up delusional thinking he was at his home , wanting to cut the stove off. Pt woke up his roommate.

## 2020-10-29 NOTE — Progress Notes (Signed)
[facility] Second Physician Opinion Progress Note for Medication Administration to Non-consenting Patients (For Involuntarily Committed Patients)  Patient: Cody Roberts Date of Birth: [birth year] MRN: [id number]   Patient seen and interviewed.  Medical record reviewed.  Patient discussed with nursing staff.  Cody Roberts is a 25 year old male with a past psychiatric history significant for major depression who presented to the [facility] on 10/24/2020 secondary to paranoid delusions.  Since admission patient has demonstrated psychotic symptoms of hallucinations, agitation and disorganized thought processes and behavior.  Nursing staff report patient has been aggressive with staff attempting to fight with staff and trying to kick staff.  He is engaged in destruction of property.  He has been disrobing and behaving bizarrely walking into walls.  Today he required emergent medications as a result of agitated, disorganized and aggressive behavior toward others for safety of patient and safety others  Reason for the Medication: The patient, without the benefit of the specific treatment measure, is incapable of participating in any available treatment plan that will give the patient a realistic opportunity of improving the patient's condition. There is, without the benefit of the specific treatment measure, a significant possibility that the patient will harm self or others before improvement of the patient's condition is realized.  Consideration of Side Effects: Consideration of the side effects related to the medication plan has been given.  Rationale for Medication Administration: Without forced medication the patient's condition will deteriorate leading to increased length of hospital stay and increased disability.    Claudie Revering, MD 10/29/20  4:14 PM   This documentation is good for (7) seven days from the date of the MD signature. New documentation must be completed every  seven (7) days with detailed justification in the medical record if the patient requires continued non-emergent administration of psychotropic medications.

## 2020-10-29 NOTE — Progress Notes (Signed)
   10/29/20 0600  Vital Signs  Pulse Rate (!) 117  BP (!) 147/101  BP Location Right Arm  BP Method Automatic  Patient Position (if appropriate) Standing  Sleep  Number of Hours 4.5

## 2020-10-29 NOTE — Progress Notes (Addendum)
Pt spit out PO medication Ativan, Trazodone, so pt given IM Haldol per MAR without incident.

## 2020-10-29 NOTE — Progress Notes (Signed)
Pt asleep, calm, respirations noted and unlabored.  Assigned MHT staff in attendance at all times. Safety maintained. Continued support, encouragement and reassurance provided to pt throughout this shift.  Pt remains safe on unit.

## 2020-10-29 NOTE — Progress Notes (Signed)
Recreation Therapy Notes  Date: 3.11.22 Time: 1005 Location: 500 Hall Dayroom  Group Topic: Communication, Team Building, Problem Solving  Goal Area(s) Addresses:  Patient will effectively work with peer towards shared goal.  Patient will identify skills used to make activity successful.  Patient will identify how skills used during activity can be used to reach post d/c goals.   Behavioral Response: None  Intervention: STEM Activity  Activity: Straw Bridge. In teams of 3-5, patients were given 15 plastic drinking straws and an equal length of masking tape. Using the materials provided, patients were instructed to build a free standing bridge-like structure to suspend an everyday item (ex: puzzle box) off of the floor or table surface. All materials were required to be used by the team in their design. LRT facilitated post-activity discussion reviewing team process. Patients were encouraged to reflect how the skills used in this activity can be generalized to daily life post discharge.   Education: Pharmacist, community, Scientist, physiological, Discharge Planning   Education Outcome: Acknowledges education/In group clarification offered/Needs additional education.   Clinical Observations/Feedback: Pt had a hard time focusing on activity.  Pt was paranoid and delusional when talking with peers.  Pt kept trying to take supplies and was messing with the STARR box on the wall.  Pt needed constant redirection during group.  Pt left and did not return.    Cody Roberts, LRT/CTRS     Cody Roberts, Cody Roberts A 10/29/2020 11:40 AM

## 2020-10-29 NOTE — Progress Notes (Signed)
Pt has been coming out the room numerous times, pt has tried to leave the unit. Pt tried to walk into a females room but was stopped by staff. Pt tried to state he belonged in the room. Pt appears to be confused at times. Needing frequent redirection.

## 2020-10-29 NOTE — Progress Notes (Signed)
Pt knocking on the doors of other patients asking if they have his phone.

## 2020-10-30 MED ORDER — HALOPERIDOL 5 MG PO TABS
10.0000 mg | ORAL_TABLET | Freq: Three times a day (TID) | ORAL | Status: DC
  Administered 2020-10-30: 10 mg via ORAL
  Filled 2020-10-30 (×3): qty 2

## 2020-10-30 MED ORDER — METOPROLOL TARTRATE 25 MG PO TABS
25.0000 mg | ORAL_TABLET | Freq: Two times a day (BID) | ORAL | Status: DC
  Administered 2020-10-30 – 2020-11-03 (×9): 25 mg via ORAL
  Filled 2020-10-30 (×13): qty 1

## 2020-10-30 MED ORDER — RISPERIDONE 2 MG PO TBDP
4.0000 mg | ORAL_TABLET | Freq: Every day | ORAL | Status: DC
  Filled 2020-10-30: qty 2

## 2020-10-30 MED ORDER — HALOPERIDOL LACTATE 5 MG/ML IJ SOLN
10.0000 mg | Freq: Three times a day (TID) | INTRAMUSCULAR | Status: DC
  Filled 2020-10-30 (×2): qty 2

## 2020-10-30 NOTE — Progress Notes (Signed)
   10/30/20 0559  Vital Signs  Pulse Rate (!) 148  Pulse Rate Source Monitor  BP 111/77  BP Location Right Arm  BP Method Automatic  Patient Position (if appropriate) Standing  Oxygen Therapy  SpO2 98 %  Sleep  Number of Hours 6.25 (pt slept from 3 pm to 9 pm)

## 2020-10-30 NOTE — Progress Notes (Signed)
1:1 Note: Patient appears to be responding to internal and external stimuli.  Looking around the room and mumbling to himself.  Medication given as prescribed.  Attended group and was attentive.  Routine safety checks maintained.  Patient is safe on the unit.

## 2020-10-30 NOTE — BHH Group Notes (Signed)
.  Psychoeducational Group Note  Date: 10-30-2020 Time: 0900-1000    Goal Setting   Purpose of Group: This group helps to provide patients with the steps of setting a goal that is specific, measurable, attainable, realistic and time specific. A discussion on how we keep ourselves stuck with negative self talk.    Participation Level:  Did not attend  Reve Crocket A  

## 2020-10-30 NOTE — Progress Notes (Signed)
1:1 Note: Patient observed pacing in his room while calling and searching for his dog.  Verbal redirection was ineffective.  Medication given as prescribed.  Routine safety checks maintained.  Patient is safe on the unit with supervision.

## 2020-10-30 NOTE — Progress Notes (Signed)
Nursing 1:1 note D:Pt observed in dayroom getting vitals taken. RR even and unlabored. No distress noted. A: 1:1 observation continues for safety  R: pt remains safe

## 2020-10-30 NOTE — Progress Notes (Signed)
Suicide safety plan placed in pt chart

## 2020-10-30 NOTE — Progress Notes (Signed)
1:1 Nursing note- Patient is currently lying in bed resting but not asleep. He was at nursing station throwing away his trash but was actually some of his clothes which his sitter retrieved. Patient mentioned he was watching tv in his room. Sitter reports he moves back and forth between the 2 beds in his room. He has been calm and pleasant but still demonstrating bizarre behavior, looking for his dog in his room for example. 1:1 continues and patient is safe with sitter at bedside.

## 2020-10-30 NOTE — Progress Notes (Signed)
Pt had an episode of emesis after eating a fruit cup. Pt given ginger ale, will continue to monitor, pt is in no distress at this time.

## 2020-10-30 NOTE — BHH Group Notes (Signed)
LCSW Group Therapy Note  10/30/2020    11am-12pm  Type of Therapy and Topic:  Group Therapy: Anger - Unhealthy versus Healthy Coping Skills  Participation Level:  Active   Description of Group:   In this group, patients acknowledged that there is a continuum of anger from "mildly irritated" to "murderous rage."  They identified how they usually or often react when angered.  They learned how healthy and unhealthy coping skills all work initially, but the unhealthy ones stop working while the healthy ones remain helpful.   They analyzed how their frequently-chosen coping skill is possibly beneficial and how it is possibly unhelpful.  The group discussed a variety of healthier coping skills that could help in resolving the actual issues, as well as how to go about planning for the the possibility of future similar situations.  Patients then individually identified one healthy coping skill that they would be willing to try.  Therapeutic Goals: . Patients will identify one thing that makes them angry and how they feel emotionally and physically, what their thoughts are or tend to be in those situations, and what healthy or unhealthy coping mechanism they typically use . Patients will identify how their coping technique works for them, as well as how it works against them. . Patients will explore possible new behaviors to use in future anger situations and decide on one technique they will try in the future . Patients will learn that anger itself is normal and cannot be eliminated, and that healthier coping skills can assist with resolving conflict rather than worsening situations.  Summary of Patient Progress:  The patient shared that he often chooses to cope with angry feelings by focusing/walking away/throwing the game controller at the wall.  He only talked about anger in the context of playing video games, and all future references were in that same context.  The patient sees this coping skill as  unhealthy because replacing the controller can be expensive.  Therapeutic Modalities:   Cognitive Behavioral Therapy Motivation Interviewing  Lynnell Chad, LCSW

## 2020-10-30 NOTE — Progress Notes (Signed)
Nursing 1:1 note D:Pt observed sitting in bed, pt appeared to be responding at times, with delusional content . RR even and unlabored. No distress noted. A: 1:1 observation continues for safety  R: pt remains safe

## 2020-10-30 NOTE — Progress Notes (Signed)
1:1 Note: Patient presents disorganized, confused and responding to internal stimuli.  Observed pacing in his room, searching under his mattress and looking around.  Needed a lot of redirections.  Food and fluid intake encouraged.  Routine safety checks maintained.  Patient is safe on the unit.

## 2020-10-30 NOTE — Progress Notes (Signed)
[facility]  10/30/2020 11:50 AM Cody Roberts  MRN:  [id number] Subjective:  Patient is a 25 year old male with a past psychiatric history significant for major depression who [facility]2020 secondary to paranoid delusions.  Objective: Patient is seen and examined.  Patient is a 25 year old male with a probable past psychiatric history significant for schizophrenia who is seen in follow-up.  Yesterday was quite uneventful day.  He was significantly disorganized, floridly psychotic, having auditory and visual hallucinations.  He eventually had to be placed on one-to-one with forced medications.  He did sleep a little bit better last night.  His examination this morning is at least he is oriented.  He admitted to continued auditory and visual hallucinations.  He denied any suicidal or homicidal ideation.  I spoke to his mother again and explained that I felt as though that his diagnosis was most likely schizophrenia.  The more information that I get the more it seems as though the symptoms have been going on for greater than 6 months to a year.  Also the fact that he is comfortable at times with his hallucinations leads me to believe that they have been present for quite a while.  He did admit today that he is been using at least half a gram to 1 full gram a day of delta 8.  I asked team how he was able to get this since it was reported by the patient as well as his family that he almost never leaves his grandmother's home.  He stated he ordered it over the Internet.  We discussed why he would not leave his grandmother's home, and clearly there paranoid delusions leading to this.  Initially his blood pressure was 140/127, but repeat was 111/77.  Pulse was reported to be between 124 and 148.  Pulse oximetry on room air was 96 to 98%.  Review of his laboratories revealed no new laboratories today.  We will attempt to repeat his EKG.  Principal  Problem: Schizophreniform disorder (HCC) Diagnosis: Principal Problem:   Schizophreniform disorder (HCC) Active Problems:   Substance-induced psychotic disorder with hallucinations (HCC)  Total Time spent with patient: 30 minutes  Past Psychiatric History: See admission H&P  Past Medical History:  Past Medical History:  Diagnosis Date  . Chest tightness 04/18/2017  . Dysthymia 04/19/2017   Referral downstairs to behavioral health for counseling, patient is advised on mental health safety precautions, can come to me if he would like to discuss meds  . Eustachian tube dysfunction, bilateral 04/19/2017   Canals clear and tympanic membranes normal, advised OTC Flonase and or second generation antihistamine Claritin/Zyrtec/Allegra or similar generic  . Sinusitis 05/18/2016  . Vision changes 05/18/2016   History reviewed. No pertinent surgical history. Family History: History reviewed. No pertinent family history. Family Psychiatric  History: See admission H&P Social History:  Social History   Substance and Sexual Activity  Alcohol Use No     Social History   Substance and Sexual Activity  Drug Use Yes  . Types: Marijuana   Comment: Delta 8    Social History   Socioeconomic History  . Marital status: Married    Spouse name: Not on file  . Number of children: Not on file  . Years of education: Not on file  . Highest education level: Not on file  Occupational History  . Not on file  Tobacco Use  . Smoking status: Never Smoker  . Smokeless  tobacco: Never Used  Vaping Use  . Vaping Use: Every day  . Substances: THC  Substance and Sexual Activity  . Alcohol use: No  . Drug use: Yes    Types: Marijuana    Comment: Delta 8  . Sexual activity: Not Currently  Other Topics Concern  . Not on file  Social History Narrative  . Not on file   Social Determinants of Health   Financial Resource Strain: Not on file  Food Insecurity: Not on file  Transportation Needs: Not on file   Physical Activity: Not on file  Stress: Not on file  Social Connections: Not on file   Additional Social History:                         Sleep: Fair  Appetite:  Fair  Current Medications: Current Facility-Administered Medications  Medication Dose Route Frequency Provider Last Rate Last Admin  . acetaminophen (TYLENOL) tablet 650 mg  650 mg Oral Q6H PRN Jaclyn Shaggy, PA-C      . alum & mag hydroxide-simeth (MAALOX/MYLANTA) 200-200-20 MG/5ML suspension 30 mL  30 mL Oral Q4H PRN Jaclyn Shaggy, PA-C      . feeding supplement (ENSURE ENLIVE / ENSURE PLUS) liquid 237 mL  237 mL Oral BID BM Melbourne Abts W, PA-C   237 mL at 10/30/20 0938  . FLUoxetine (PROZAC) capsule 20 mg  20 mg Oral Daily Aaro, Meyers W, PA-C   20 mg at 10/30/20 0813  . haloperidol (HALDOL) tablet 10 mg  10 mg Oral Q8H PRN Antonieta Pert, MD   10 mg at 10/30/20 1601   Or  . haloperidol lactate (HALDOL) injection 10 mg  10 mg Intramuscular Q8H PRN Antonieta Pert, MD   10 mg at 10/29/20 2125  . hydrOXYzine (ATARAX/VISTARIL) tablet 25 mg  25 mg Oral TID PRN Jaclyn Shaggy, PA-C   25 mg at 10/30/20 0813  . LORazepam (ATIVAN) tablet 2 mg  2 mg Oral Q6H PRN Antonieta Pert, MD   2 mg at 10/29/20 2332   And  . ziprasidone (GEODON) injection 20 mg  20 mg Intramuscular Q6H PRN Antonieta Pert, MD   20 mg at 10/29/20 1453  . magnesium hydroxide (MILK OF MAGNESIA) suspension 30 mL  30 mL Oral Daily PRN Melbourne Abts W, PA-C      . risperiDONE (RISPERDAL M-TABS) disintegrating tablet 3 mg  3 mg Oral Daily Antonieta Pert, MD   3 mg at 10/30/20 0932  . risperiDONE (RISPERDAL M-TABS) disintegrating tablet 4 mg  4 mg Oral QHS Antonieta Pert, MD      . traZODone (DESYREL) tablet 50 mg  50 mg Oral QHS PRN Bailee, Thall, PA-C   50 mg at 10/29/20 2332    Lab Results: No results found for this or any previous visit (from the past 48 hour(s)).  Blood Alcohol level:  No results found for: [facility]  Metabolic  Disorder Labs: Lab Results  Component Value Date   HGBA1C 4.3 (L) 10/24/2020   MPG 76.71 10/24/2020   No results found for: PROLACTIN Lab Results  Component Value Date   CHOL 214 (H) 10/24/2020   TRIG 57 10/24/2020   HDL 57 10/24/2020   CHOLHDL 3.8 10/24/2020   VLDL 11 10/24/2020   LDLCALC 146 (H) 10/24/2020    Physical Findings: AIMS: Facial and Oral Movements Muscles of Facial Expression: None, normal Lips and Perioral Area: None, normal  Jaw: None, normal Tongue: None, normal,Extremity Movements Upper (arms, wrists, hands, fingers): None, normal Lower (legs, knees, ankles, toes): None, normal, Trunk Movements Neck, shoulders, hips: None, normal, Overall Severity Severity of abnormal movements (highest score from questions above): None, normal Incapacitation due to abnormal movements: None, normal Patient's awareness of abnormal movements (rate only patient's report): No Awareness, Dental Status Current problems with teeth and/or dentures?: No Does patient usually wear dentures?: No  CIWA:    COWS:     Musculoskeletal: Strength & Muscle Tone: within normal limits Gait & Station: normal Patient leans: N/A  Psychiatric Specialty Exam:  Presentation  General Appearance: Bizarre  Eye Contact:Fair  Speech:Normal Rate  Speech Volume:Decreased  Handedness:Right   Mood and Affect  Mood:Anxious; Dysphoric; Irritable  Affect:Inappropriate   Thought Process  Thought Processes:Disorganized  Descriptions of Associations:Loose  Orientation:Full (Time, Place and Person)  Thought Content:Delusions; Paranoid Ideation  History of Schizophrenia/Schizoaffective disorder:No  Duration of Psychotic Symptoms:Greater than six months  Hallucinations:Hallucinations: Auditory; Visual Description of Auditory Hallucinations: piano music, voices from faces in the floor Description of Visual Hallucinations: Face is in the floor, images outside of his bedroom window  Ideas  of Reference:Delusions; Paranoia  Suicidal Thoughts:Suicidal Thoughts: No  Homicidal Thoughts:Homicidal Thoughts: No   Sensorium  Memory:Immediate Fair; Recent Fair; Remote Fair  Judgment:Impaired  Insight:Lacking   Executive Functions  Concentration:Fair  Attention Span:Fair  Recall:Poor  Fund of Knowledge:Fair  Language:Fair   Psychomotor Activity  Psychomotor Activity:Psychomotor Activity: Increased; Restlessness   Assets  Assets:Desire for Improvement; Resilience; Social Support   Sleep  Sleep:Sleep: Fair Number of Hours of Sleep: 6.5    Physical Exam: Physical Exam Vitals and nursing note reviewed.  HENT:     Head: Normocephalic and atraumatic.  Pulmonary:     Effort: Pulmonary effort is normal.  Neurological:     General: No focal deficit present.     Mental Status: He is alert and oriented to person, place, and time.    ROS Blood pressure 111/77, pulse (!) 148, temperature 98.2 F (36.8 C), temperature source Oral, resp. rate 18, height 5\' 7"  (1.702 m), weight 56.2 kg, SpO2 98 %. Body mass index is 19.42 kg/m.   Treatment Plan Summary: Daily contact with patient to assess and evaluate symptoms and progress in treatment, Medication management and Plan : Patient is seen and examined.  Patient is a 25 year old male with the above-stated past psychiatric history who is seen in follow-up.   Diagnosis: 1.  Schizophreniform disorder (but most likely schizophrenia when you look at his symptoms and timeframe.  This would be by history alone). 2.  Cannabis dependence  Pertinent findings on examination today: 1.  Continued auditory and visual hallucinations. 2.  Continued paranoid delusional thinking. 3.  Orientation is improved from yesterday. 4.  Patient disclose exactly how much substance he was using at home, and the timeframe for his hallucinations and delusions.  Plan: 1.  Continue Risperdal but increase to 4 mg p.o. twice daily for  psychosis. 2.  Continue haloperidol 10 mg p.o. or IM every 8 hours as needed agitation or psychosis.  This would also be if he refuses oral medications. 3.  Continue Ativan 2 mg p.o. every 6 hours as needed anxiety or agitation. 4.  Continue Geodon 20 mg IM every 6 hours as needed agitation. 5.  Continue trazodone 50 mg p.o. nightly as needed insomnia. 6.  Order EKG for tachycardia. 7.  Metoprolol short acting 25 mg p.o. twice daily for tachycardia and elevated blood  pressure. 8.  Disposition planning-in progress.  Antonieta Pert, MD 10/30/2020, 11:50 AM

## 2020-10-30 NOTE — Progress Notes (Signed)
Adult Psychoeducational Group Note  Date:  10/30/2020 Time:  9:18 PM  Group Topic/Focus:  Wrap-Up Group:   The focus of this group is to help patients review their daily goal of treatment and discuss progress on daily workbooks.  Participation Level:  Did Not Attend    Cody Roberts 10/30/2020, 9:18 PM

## 2020-10-31 MED ORDER — HALOPERIDOL 5 MG PO TABS
15.0000 mg | ORAL_TABLET | Freq: Three times a day (TID) | ORAL | Status: DC
  Administered 2020-10-31 – 2020-11-01 (×6): 15 mg via ORAL
  Filled 2020-10-31 (×10): qty 3

## 2020-10-31 MED ORDER — HALOPERIDOL LACTATE 5 MG/ML IJ SOLN
15.0000 mg | Freq: Three times a day (TID) | INTRAMUSCULAR | Status: DC
  Filled 2020-10-31 (×10): qty 3

## 2020-10-31 MED ORDER — MIRTAZAPINE 15 MG PO TABS
15.0000 mg | ORAL_TABLET | Freq: Every day | ORAL | Status: DC
  Administered 2020-10-31: 15 mg via ORAL
  Filled 2020-10-31 (×2): qty 1

## 2020-10-31 NOTE — Progress Notes (Signed)
1:1 Note 1030  Patient has remained in his room today.  Respirations even and unlabored.  No signs/symptoms of pain/distress noted on patient's face/body movements.  Safety maintained with 1:1 per MD order for safety.  Patient has pointed to walls as if talking to someone.   People touching walls and talking to people.

## 2020-10-31 NOTE — Progress Notes (Signed)
1:1Note- Patient is currently lying in bed resting but not asleep. He received his hs medications earlier along with his snacks. He continues to respond to internal stimuli. Sitter reports that even though he is lying down resting he still continues to talk to himself. 1:1 continues with sitter at bedside for safety.

## 2020-10-31 NOTE — Progress Notes (Signed)
Patient starting to become upset and irritable thinking that he needs to turn off his computer. Writer informed him that he is not at home and he responded " come on guys its not that difficult i'm just trying to turn off my computer." Restless and becoming more irritable. 2mg  ativan given and trazodone 50 mg. Will monitor effectiveness of medications given.

## 2020-10-31 NOTE — Progress Notes (Signed)
1:1 Nursing note -Patient is not asleep has been up and down doing bizarre things in his room. Looking for his dog or acting as if he hears something. Patient is not aggressive. 1:1 continues patient is safe with sitter at bedside.

## 2020-10-31 NOTE — Progress Notes (Signed)
1:1 Note 0830  Patient resting in bed with eyes closed.  Respirations even and unlabored.  No signs/symptoms of pain/distress noted on patient's face/body movements.  Safety maintained with 1:1 per MD orders.

## 2020-10-31 NOTE — Progress Notes (Addendum)
1:1 Note 1430  Patient sitting in bed, talking to himself.  Respirations even and unlabored.  No signs/symptoms of pain/distress noted on patient's face/ body movements.  1:1 continues per MD order for safety.

## 2020-10-31 NOTE — Progress Notes (Signed)
[facility] MD Progress Note  10/31/2020 11:14 AM Cody Roberts  MRN:  [id number] Subjective:  Patient is a 25 year old male with a past psychiatric history significant for major depression who presented to the [facility] on 10/24/2020 secondary to paranoid delusions.  Objective: Patient is seen and examined.  Patient is a 25 year old male with a past psychiatric history significant for probable schizophrenia versus schizophreniform disorder.  He is seen in follow-up.  His orientation has remained stable at least at this point, but he remains significantly psychotic.  He is still not sleeping.  He is still having auditory and visual hallucinations.  He still remains paranoid.  He appears to be a little bit less disruptive than he had been yesterday.  No new laboratories.  His vital signs are stable, he is afebrile.  Pulse oximetry on room air is 96%.  He remains one-to-one.  Principal Problem: Schizophreniform disorder (HCC) Diagnosis: Principal Problem:   Schizophreniform disorder (HCC) Active Problems:   Substance-induced psychotic disorder with hallucinations (HCC)  Total Time spent with patient: 20 minutes  Past Psychiatric History: See admission H&P  Past Medical History:  Past Medical History:  Diagnosis Date  . Chest tightness 04/18/2017  . Dysthymia 04/19/2017   Referral downstairs to behavioral health for counseling, patient is advised on mental health safety precautions, can come to me if he would like to discuss meds  . Eustachian tube dysfunction, bilateral 04/19/2017   Canals clear and tympanic membranes normal, advised OTC Flonase and or second generation antihistamine Claritin/Zyrtec/Allegra or similar generic  . Sinusitis 05/18/2016  . Vision changes 05/18/2016   History reviewed. No pertinent surgical history. Family History: History reviewed. No pertinent family history. Family Psychiatric  History: See admission H&P Social History:  Social History    Substance and Sexual Activity  Alcohol Use No     Social History   Substance and Sexual Activity  Drug Use Yes  . Types: Marijuana   Comment: Delta 8    Social History   Socioeconomic History  . Marital status: Married    Spouse name: Not on file  . Number of children: Not on file  . Years of education: Not on file  . Highest education level: Not on file  Occupational History  . Not on file  Tobacco Use  . Smoking status: Never Smoker  . Smokeless tobacco: Never Used  Vaping Use  . Vaping Use: Every day  . Substances: THC  Substance and Sexual Activity  . Alcohol use: No  . Drug use: Yes    Types: Marijuana    Comment: Delta 8  . Sexual activity: Not Currently  Other Topics Concern  . Not on file  Social History Narrative  . Not on file   Social Determinants of Health   Financial Resource Strain: Not on file  Food Insecurity: Not on file  Transportation Needs: Not on file  Physical Activity: Not on file  Stress: Not on file  Social Connections: Not on file   Additional Social History:                         Sleep: Poor  Appetite:  Fair  Current Medications: Current Facility-Administered Medications  Medication Dose Route Frequency Provider Last Rate Last Admin  . acetaminophen (TYLENOL) tablet 650 mg  650 mg Oral Q6H PRN Jaclyn Shaggy, PA-C      . alum & mag hydroxide-simeth (MAALOX/MYLANTA) 200-200-20 MG/5ML suspension 30 mL  30 mL Oral Q4H PRN Melbourne Abts W, PA-C      . feeding supplement (ENSURE ENLIVE / ENSURE PLUS) liquid 237 mL  237 mL Oral BID BM Juniel, Groene W, PA-C   237 mL at 10/30/20 1437  . haloperidol (HALDOL) tablet 15 mg  15 mg Oral TID Antonieta Pert, MD   15 mg at 10/31/20 0815   Or  . haloperidol lactate (HALDOL) injection 15 mg  15 mg Intramuscular TID Antonieta Pert, MD      . hydrOXYzine (ATARAX/VISTARIL) tablet 25 mg  25 mg Oral TID PRN Jaclyn Shaggy, PA-C   25 mg at 10/30/20 2025  . LORazepam (ATIVAN)  tablet 2 mg  2 mg Oral Q6H PRN Antonieta Pert, MD   2 mg at 10/29/20 2332   And  . ziprasidone (GEODON) injection 20 mg  20 mg Intramuscular Q6H PRN Antonieta Pert, MD   20 mg at 10/29/20 1453  . magnesium hydroxide (MILK OF MAGNESIA) suspension 30 mL  30 mL Oral Daily PRN Melbourne Abts W, PA-C      . metoprolol tartrate (LOPRESSOR) tablet 25 mg  25 mg Oral BID Antonieta Pert, MD   25 mg at 10/31/20 0815  . mirtazapine (REMERON) tablet 15 mg  15 mg Oral QHS Antonieta Pert, MD      . traZODone (DESYREL) tablet 50 mg  50 mg Oral QHS PRN Ketrick, Matney, PA-C   50 mg at 10/30/20 2025    Lab Results: No results found for this or any previous visit (from the past 48 hour(s)).  Blood Alcohol level:  No results found for: [facility]  Metabolic Disorder Labs: Lab Results  Component Value Date   HGBA1C 4.3 (L) 10/24/2020   MPG 76.71 10/24/2020   No results found for: PROLACTIN Lab Results  Component Value Date   CHOL 214 (H) 10/24/2020   TRIG 57 10/24/2020   HDL 57 10/24/2020   CHOLHDL 3.8 10/24/2020   VLDL 11 10/24/2020   LDLCALC 146 (H) 10/24/2020    Physical Findings: AIMS: Facial and Oral Movements Muscles of Facial Expression: None, normal Lips and Perioral Area: None, normal Jaw: None, normal Tongue: None, normal,Extremity Movements Upper (arms, wrists, hands, fingers): None, normal Lower (legs, knees, ankles, toes): None, normal, Trunk Movements Neck, shoulders, hips: None, normal, Overall Severity Severity of abnormal movements (highest score from questions above): None, normal Incapacitation due to abnormal movements: None, normal Patient's awareness of abnormal movements (rate only patient's report): No Awareness, Dental Status Current problems with teeth and/or dentures?: No Does patient usually wear dentures?: No  CIWA:    COWS:     Musculoskeletal: Strength & Muscle Tone: within normal limits Gait & Station: normal Patient leans: N/A  Psychiatric  Specialty Exam:  Presentation  General Appearance: Disheveled  Eye Contact:Fair  Speech:Normal Rate  Speech Volume:Decreased  Handedness:Right   Mood and Affect  Mood:Dysphoric; Anxious  Affect:Blunt   Thought Process  Thought Processes:Disorganized  Descriptions of Associations:Loose  Orientation:Full (Time, Place and Person)  Thought Content:Delusions; Paranoid Ideation; Rumination  History of Schizophrenia/Schizoaffective disorder:No  Duration of Psychotic Symptoms:Greater than six months  Hallucinations:Hallucinations: Auditory; Visual Description of Auditory Hallucinations: piano music, voices from faces in the floor Description of Visual Hallucinations: Face is in the floor, images outside of his bedroom window  Ideas of Reference:Delusions; Paranoia  Suicidal Thoughts:Suicidal Thoughts: No  Homicidal Thoughts:Homicidal Thoughts: No   Sensorium  Memory:Immediate Fair; Recent Fair; Remote Fair  Judgment:Impaired  Insight:Lacking   Executive Functions  Concentration:Fair  Attention Span:Fair  Recall:Poor  Fund of Knowledge:Fair  Language:Good   Psychomotor Activity  Psychomotor Activity:Psychomotor Activity: Increased   Assets  Assets:Desire for Improvement; Resilience; Social Support; Housing   Sleep  Sleep:Sleep: Poor Number of Hours of Sleep: 1    Physical Exam: Physical Exam Vitals and nursing note reviewed.  HENT:     Head: Normocephalic and atraumatic.  Pulmonary:     Effort: Pulmonary effort is normal.  Neurological:     General: No focal deficit present.     Mental Status: He is alert and oriented to person, place, and time.    ROS Blood pressure 120/86, pulse 79, temperature 98.3 F (36.8 C), temperature source Oral, resp. rate 18, height 5\' 7"  (1.702 m), weight 56.2 kg, SpO2 96 %. Body mass index is 19.42 kg/m.   Treatment Plan Summary: Daily contact with patient to assess and evaluate symptoms and progress  in treatment, Medication management and Plan : Patient is seen and examined.  Patient is a 25 year old male with the above-stated past psychiatric history who is seen in follow-up.   Diagnosis: 1.  Schizophreniform disorder (but most likely schizophrenia when you look at his symptoms and timeframe.  This would be by history alone). 2.  Cannabis dependence  Pertinent findings on examination today: 1.  Continued auditory and visual hallucinations. 2.  Continued paranoid delusional thinking. 3.  Orientation is improved from yesterday. 4.  Patient disclose exactly how much substance he was using at home, and the timeframe for his hallucinations and delusions.  Plan: 1.  Risperdal was stopped yesterday secondary to ineffectiveness. 2.  Yesterday as Haldol was increased to 10 mg p.o. or IM 3 times daily.  This was for agitation and psychosis, and this will be increased to 15 mg p.o. or IM 3 times daily. 3.  Continue Ativan 2 mg p.o. every 6 hours as needed anxiety or agitation. 4.  Continue Geodon 20 mg IM every 6 hours as needed agitation. 5.  Continue trazodone 50 mg p.o. nightly as needed insomnia. 6.  Continue to request EKG for assessment secondary to QTC prolonging medications. 7.  Continue metoprolol short acting 25 mg p.o. twice daily for tachycardia and blood pressure. 8.  Disposition planning-in progress.  25, MD 10/31/2020, 11:14 AM

## 2020-10-31 NOTE — Progress Notes (Signed)
   10/31/20 2211  COVID-19 Daily Checkoff  Have you had a fever (temp > 37.80C/100F)  in the past 24 hours?  No  If you have had runny nose, nasal congestion, sneezing in the past 24 hours, has it worsened? No  COVID-19 EXPOSURE  Have you traveled outside the state in the past 14 days? No  Have you been in contact with someone with a confirmed diagnosis of COVID-19 or PUI in the past 14 days without wearing appropriate PPE? No  Have you been living in the same home as a person with confirmed diagnosis of COVID-19 or a PUI (household contact)? No  Have you been diagnosed with COVID-19? No

## 2020-10-31 NOTE — Progress Notes (Signed)
1:1 Note 1730  1:1 Note 1230  Patient sitting in bed, talking to himself.  Respirations even and unlabored.  No signs/symptoms of pain/distress noted on patient's face/ body movements.  1:1 continues per MD order for safety.

## 2020-10-31 NOTE — Progress Notes (Signed)
Patient currently up waiting for his vitals to be taken. He is calm and pleasant, Sitter by patients side and 1:1 continues.

## 2020-10-31 NOTE — Progress Notes (Signed)
1:1 Note 1230  Patient sitting in bed, talking to himself.  Respirations even and unlabored.  No signs/symptoms of pain/distress noted on patient's face/ body movements.  1:1 continues per MD order for safety.

## 2020-10-31 NOTE — [facility] Group Notes (Signed)
[facility] LCSW Group Therapy Note  Date/Time:  10/31/2020  11:00AM-12:00PM  Type of Therapy and Topic:  Group Therapy:  Music and Mood  Participation Level:  Did Not Attend   Description of Group: In this process group, members listened to a variety of genres of music and identified that different types of music evoke different responses.  Patients were encouraged to identify music that was soothing for them and music that was energizing for them.  Patients discussed how this knowledge can help with wellness and recovery in various ways including managing depression and anxiety as well as encouraging healthy sleep habits.    Therapeutic Goals: Patients will explore the impact of different varieties of music on mood Patients will verbalize the thoughts they have when listening to different types of music Patients will identify music that is soothing to them as well as music that is energizing to them Patients will discuss how to use this knowledge to assist in maintaining wellness and recovery Patients will explore the use of music as a coping skill  Summary of Patient Progress:  N/A  Therapeutic Modalities: Solution Focused Brief Therapy Activity   Ambrose Mantle, LCSW

## 2020-10-31 NOTE — Progress Notes (Signed)
1:1 Nursing note - Patient currently lying in bed asleep with no distress noted. Sitter at bedside and 1:1 continues.

## 2020-11-01 LAB — POC SARS CORONAVIRUS 2 AG: SARS Coronavirus 2 Ag: NEGATIVE

## 2020-11-01 MED ORDER — OLANZAPINE 5 MG PO TBDP
5.0000 mg | ORAL_TABLET | Freq: Two times a day (BID) | ORAL | Status: DC
  Administered 2020-11-01: 5 mg via ORAL
  Filled 2020-11-01 (×6): qty 1

## 2020-11-01 MED ORDER — HALOPERIDOL 5 MG PO TABS: 10.0000 mg | ORAL_TABLET | Freq: Four times a day (QID) | ORAL | Status: DC | PRN

## 2020-11-01 MED ORDER — CARBAMAZEPINE 100 MG PO CHEW
200.0000 mg | CHEWABLE_TABLET | Freq: Three times a day (TID) | ORAL | Status: DC
  Administered 2020-11-01 – 2020-11-02 (×5): 200 mg via ORAL
  Filled 2020-11-01 (×10): qty 2

## 2020-11-01 MED ORDER — HALOPERIDOL LACTATE 5 MG/ML IJ SOLN: 10.0000 mg | Freq: Four times a day (QID) | INTRAMUSCULAR | Status: DC | PRN

## 2020-11-01 MED ORDER — CLOZAPINE 25 MG PO TABS
50.0000 mg | ORAL_TABLET | Freq: Every day | ORAL | Status: DC
  Filled 2020-11-01 (×3): qty 2

## 2020-11-01 NOTE — [facility] Group Notes (Signed)
The focus of this group is to help patients establish daily goals to achieve during treatment and discuss how the patient can incorporate goal setting into their daily lives to aide in recovery.  Pt did not attend group 

## 2020-11-01 NOTE — Progress Notes (Signed)
   11/01/20 2100  Psych Admission Type (Psych Patients Only)  Admission Status Voluntary  Psychosocial Assessment  Patient Complaints Suspiciousness  Eye Contact Fair  Facial Expression Anxious  Affect Appropriate to circumstance  Speech Logical/coherent  Interaction Minimal;Cautious  Motor Activity Slow  Appearance/Hygiene Disheveled  Behavior Characteristics Impulsive;Fidgety  Mood Suspicious;Preoccupied  Aggressive Behavior  Effect No apparent injury  Thought Process  Coherency WDL  Content Paranoia;Blaming others  Delusions Paranoid;Persecutory  Perception Hallucinations  Hallucination Visual ("I saw a TV in my room this morning when I woke up")  Judgment Impaired  Confusion Moderate  Danger to Self  Current suicidal ideation? Denies  Self-Injurious Behavior No self-injurious ideation or behavior indicators observed or expressed   Agreement Not to Harm Self Yes  Description of Agreement verbally contract for safety  Danger to Others  Danger to Others None reported or observed

## 2020-11-01 NOTE — Progress Notes (Signed)
Nursing 1:1 note D:Pt observed sleeping in bed with eyes closed. RR even and unlabored. No distress noted. A: 1:1 observation continues for safety  R: pt remains safe  

## 2020-11-01 NOTE — Progress Notes (Signed)
1:1 Note - Patient has not slept at all. He is becoming more combative when redirected to stop trying to pull at the air conditioner panel. He takes gait belt off when tired of wearing it. He is actively hallucinating and verbally aggressive towards sitter. 1:1 continues with sitter at patients side.

## 2020-11-01 NOTE — Progress Notes (Signed)
Patient has yet to go to sleep and is constantly trying to walk around with his eyes closed feeling his way around when up attempting to walk or sitting up in his bed reaching for things that aren't there. Patient continue to argue with staff about [place] not letting him take his dog out. Staff having to prevent him from falling using gait belt. Patient is actively hallucinating and becoming more aggressive verbally and is not redirectable currently. Will continue to monitor.

## 2020-11-01 NOTE — Tx Team (Signed)
Interdisciplinary Treatment and Diagnostic Plan Update  11/01/2020 Time of Session: 9:30am  Cody Roberts MRN: [id number]  Principal Diagnosis: Schizophreniform disorder [facility])  Secondary Diagnoses: Principal Problem:   Schizophreniform disorder (HCC) Active Problems:   Substance-induced psychotic disorder with hallucinations (HCC)   Current Medications:  Current Facility-Administered Medications  Medication Dose Route Frequency Provider Last Rate Last Admin  . acetaminophen (TYLENOL) tablet 650 mg  650 mg Oral Q6H PRN Jaclyn Shaggy, PA-C      . alum & mag hydroxide-simeth (MAALOX/MYLANTA) 200-200-20 MG/5ML suspension 30 mL  30 mL Oral Q4H PRN Melbourne Abts W, PA-C      . carbamazepine (TEGRETOL) chewable tablet 200 mg  200 mg Oral TID Antonieta Pert, MD   200 mg at 11/01/20 0903  . feeding supplement (ENSURE ENLIVE / ENSURE PLUS) liquid 237 mL  237 mL Oral BID BM Zaccai, Chavarin W, PA-C   237 mL at 10/31/20 1600  . haloperidol (HALDOL) tablet 15 mg  15 mg Oral TID Antonieta Pert, MD   15 mg at 11/01/20 8329   Or  . haloperidol lactate (HALDOL) injection 15 mg  15 mg Intramuscular TID Antonieta Pert, MD      . hydrOXYzine (ATARAX/VISTARIL) tablet 25 mg  25 mg Oral TID PRN Jaclyn Shaggy, PA-C   25 mg at 11/01/20 0903  . LORazepam (ATIVAN) tablet 2 mg  2 mg Oral Q6H PRN Antonieta Pert, MD   2 mg at 10/31/20 2246   And  . ziprasidone (GEODON) injection 20 mg  20 mg Intramuscular Q6H PRN Antonieta Pert, MD   20 mg at 11/01/20 0317  . magnesium hydroxide (MILK OF MAGNESIA) suspension 30 mL  30 mL Oral Daily PRN Melbourne Abts W, PA-C      . metoprolol tartrate (LOPRESSOR) tablet 25 mg  25 mg Oral BID Antonieta Pert, MD   25 mg at 11/01/20 0904  . OLANZapine zydis (ZYPREXA) disintegrating tablet 5 mg  5 mg Oral BID Antonieta Pert, MD   5 mg at 11/01/20 0904  . traZODone (DESYREL) tablet 50 mg  50 mg Oral QHS PRN Tedford, Berg, PA-C   50 mg at 10/31/20 2246   PTA  Medications: Medications Prior to Admission  Medication Sig Dispense Refill Last Dose  . FLUoxetine (PROZAC) 20 MG capsule Take 1 capsule (20 mg total) by mouth daily. 30 capsule 1   . OLANZapine (ZYPREXA) 5 MG tablet Take 1 tablet (5 mg total) by mouth at bedtime. 30 tablet 1     Patient Stressors: Marital or family conflict Medication change or noncompliance Substance abuse  Patient Strengths: General fund of knowledge Motivation for treatment/growth Supportive family/friends  Treatment Modalities: Medication Management, Group therapy, Case management,  1 to 1 session with clinician, Psychoeducation, Recreational therapy.   Physician Treatment Plan for Primary Diagnosis: Schizophreniform disorder (HCC) Long Term Goal(s): Improvement in symptoms so as ready for discharge Improvement in symptoms so as ready for discharge   Short Term Goals: Ability to identify changes in lifestyle to reduce recurrence of condition will improve Ability to verbalize feelings will improve Ability to disclose and discuss suicidal ideas Ability to demonstrate self-control will improve Ability to identify and develop effective coping behaviors will improve Ability to maintain clinical measurements within normal limits will improve Ability to identify triggers associated with substance abuse/mental health issues will improve Ability to identify changes in lifestyle to reduce recurrence of condition will improve Ability to verbalize feelings  will improve Ability to disclose and discuss suicidal ideas Ability to demonstrate self-control will improve Ability to identify and develop effective coping behaviors will improve Ability to maintain clinical measurements within normal limits will improve Ability to identify triggers associated with substance abuse/mental health issues will improve  Medication Management: Evaluate patient's response, side effects, and tolerance of medication regimen.  Therapeutic  Interventions: 1 to 1 sessions, Unit Group sessions and Medication administration.  Evaluation of Outcomes: Not Met  Physician Treatment Plan for Secondary Diagnosis: Principal Problem:   Schizophreniform disorder ([place]) Active Problems:   Substance-induced psychotic disorder with hallucinations ([place])  Long Term Goal(s): Improvement in symptoms so as ready for discharge Improvement in symptoms so as ready for discharge   Short Term Goals: Ability to identify changes in lifestyle to reduce recurrence of condition will improve Ability to verbalize feelings will improve Ability to disclose and discuss suicidal ideas Ability to demonstrate self-control will improve Ability to identify and develop effective coping behaviors will improve Ability to maintain clinical measurements within normal limits will improve Ability to identify triggers associated with substance abuse/mental health issues will improve Ability to identify changes in lifestyle to reduce recurrence of condition will improve Ability to verbalize feelings will improve Ability to disclose and discuss suicidal ideas Ability to demonstrate self-control will improve Ability to identify and develop effective coping behaviors will improve Ability to maintain clinical measurements within normal limits will improve Ability to identify triggers associated with substance abuse/mental health issues will improve     Medication Management: Evaluate patient's response, side effects, and tolerance of medication regimen.  Therapeutic Interventions: 1 to 1 sessions, Unit Group sessions and Medication administration.  Evaluation of Outcomes: Not Met   RN Treatment Plan for Primary Diagnosis: Schizophreniform disorder ([place]) Long Term Goal(s): Knowledge of disease and therapeutic regimen to maintain health will improve  Short Term Goals: Ability to remain free from injury will improve, Ability to demonstrate self-control, Ability to  participate in decision making will improve, Ability to verbalize feelings will improve, Ability to disclose and discuss suicidal ideas and Ability to identify and develop effective coping behaviors will improve  Medication Management: RN will administer medications as ordered by provider, will assess and evaluate patient's response and provide education to patient for prescribed medication. RN will report any adverse and/or side effects to prescribing provider.  Therapeutic Interventions: 1 on 1 counseling sessions, Psychoeducation, Medication administration, Evaluate responses to treatment, Monitor vital signs and CBGs as ordered, Perform/monitor CIWA, COWS, AIMS and Fall Risk screenings as ordered, Perform wound care treatments as ordered.  Evaluation of Outcomes: Not Met   LCSW Treatment Plan for Primary Diagnosis: Schizophreniform disorder [facility]) Long Term Goal(s): Safe transition to appropriate next level of care at discharge, Engage patient in therapeutic group addressing interpersonal concerns.  Short Term Goals: Engage patient in aftercare planning with referrals and resources, Increase social support, Increase emotional regulation, Facilitate acceptance of mental health diagnosis and concerns, Identify triggers associated with mental health/substance abuse issues and Increase skills for wellness and recovery  Therapeutic Interventions: Assess for all discharge needs, 1 to 1 time with Social worker, Explore available resources and support systems, Assess for adequacy in community support network, Educate family and significant other(s) on suicide prevention, Complete Psychosocial Assessment, Interpersonal group therapy.  Evaluation of Outcomes: Not Met   Progress in Treatment: Attending groups: No. Participating in groups: No. Taking medication as prescribed: Yes. Toleration medication: Yes. Family/Significant other contact made: Yes, individual(s) contacted:  Mother  Patient  understands diagnosis: No. Discussing patient identified problems/goals with staff: Yes. Medical problems stabilized or resolved: Yes. Denies suicidal/homicidal ideation: Yes. Issues/concerns per patient self-inventory: No.   New problem(s) identified: No, Describe:  None   New Short Term/Long Term Goal(s): medication stabilization, elimination of SI thoughts, development of comprehensive mental wellness plan.   Patient Goals:  "To get coping skills"   Discharge Plan or Barriers: Patient is to return to stay with grandmother at discharge. Patient is to follow up with outpatient therapy and medication management at discharge.   Reason for Continuation of Hospitalization: Delusions  Medication stabilization  Estimated Length of Stay: 3 to 5 days  Attendees: Patient:  10/27/2020   Physician: 10/27/2020   Nursing:  10/27/2020   RN Care Manager: 10/27/2020   Social Worker: Darletta Moll, LCSW 10/27/2020   Recreational Therapist:  10/27/2020   Other:  10/27/2020   Other:  10/27/2020   Other: 10/27/2020     Scribe for Treatment Team: Vassie Moselle, LCSW 11/01/2020 10:31 AM

## 2020-11-01 NOTE — Progress Notes (Signed)
1:1 Note Patient is currently sitting up in bed awake talking to himself. He is not aggressive but gets frustrated some when talking to himself. Sitter at bedside and 1:1 continues for safety.

## 2020-11-01 NOTE — Progress Notes (Signed)
Recreation Therapy Notes  Date: 2.14.22 Time: 1000 Location: 500 [address] Dayroom  Group Topic: Triggers  Goal Area(s) Addresses:  Patient will identify triggers. Patient will verbalize ways to deal with triggers. Patient will verbalize how dealing with triggers positively post d/c is helpful.  Behavioral Response: Minimal  Intervention: Worksheet/Stretching  Activity: Triggers and Stretching.  Patients were given a worksheet in which they were to identify their 3 biggest triggers, how they avoid those triggers and how they deal with triggers head on.  LRT then let each member get turns leading a stretch for the remaining group members to loosen them up.  Education: Communication, Discharge Planning  Education Outcome: Acknowledges understanding/In group clarification offered/Needs additional education.   Clinical Observations/Feedback:  Cody Roberts had a hard time focusing and identifying triggers. Cody Roberts was responding to internal stimuli and talking to people not there.  Cody Roberts was speaking in jumbled words was talking about sneaking out super late and waking up super early.  Cody Roberts also expressed staying up 36 hours would fix sleep patterns.  Cody Roberts was more focused during the stretches.  Cody Roberts was able to lead group in numerous stretches and follow along with peers as they lead group in stretches as well.     Caroll Rancher, LRT/CTRS     Lillia Abed, Saxon Crosby A 11/01/2020 11:30 AM

## 2020-11-01 NOTE — Progress Notes (Addendum)
[facility] MD Progress Note  11/02/2020 10:14 AM Cody Roberts  MRN:  [id number]   Chief Complaint: psychosis  Subjective:  Cody Roberts is a 25 y.o. male with a history of major depression, who was initially admitted for inpatient psychiatric hospitalization on 10/27/2020 for management of paranoid delusions. Prior to admission he reportedly had been using THC and Delta 8. Since admission, he has demonstrated hallucinations, agitation, periods of aggression, and disorganized thinking. The patient is currently on Hospital Day 6.   Chart Review from last 24 hours:  The patient's chart was reviewed and nursing notes were reviewed. The patient's case was discussed in multidisciplinary team meeting. Per nursing notes, he attempted to pull heater off the wall yesterday during day shift and reported VH on nightshift. He remained on 1:1. Per MAR he did not receive Clozaril overnight due to being asleep. He was otherwise compliant with scheduled medications. He received PRN Vistaril X1 yesterday.  Information Obtained Today During Patient Interview: The patient was seen and evaluated in his room in the presence of nursing. On assessment today the patient reports he slept well overnight and is feeling a "little foggy" today. Per nurses, he appears more linear and less agitated today during our interview. When questioned about the reason for his admission, he states he was using Delta 8 prior to admission and was "having hallucinations." He does not recall being agitated during this admission but states "I was seeing pets that were not there." He currently states he is having some "whispers" that are external and are in the voice of "people I know." He denies command AH. He denies current VH or ideas of reference. He admits to having belief that someone may be putting thoughts in or out of his head but states that he is trying to discern "what is real and what is not." He denies SI or HI. He denies current medication  side-effects. We discussed that the plan was to start Clozaril, and after discussion of r/b/se/a to this med, he declines Clozaril and requests to remain on his present medication regimen. He states that he thinks "my thoughts are getting clearer" with improved sleep, and he would like a trial on this present regimen before making med changes. Time was spent discussing the need to abstain from synthetic drugs and illicit drugs after discharge. I discussed that his drug use could have unmasked an underlying primary psychotic d/o and suggested that he allow the team to proceed with neuroimaging and additional lab testing to w/u new onset psychosis. I advised that the hope is that he will continue to clear as he metabolizes the Delta 8 if this was a substance induced psychotic d/o,  but that the team cannot r/o a schizophreniform d/o at this time. He agreed to discussed testing and neuroimaging. He reports fair appetite with increased desire to eat and drink today.  Principal Problem: Schizophrenia spectrum disorder with psychotic disorder type not yet determined ([place]) Diagnosis: Principal Problem:   Schizophrenia spectrum disorder with psychotic disorder type not yet determined ([place]) Active Problems:   Marijuana abuse  Total Time Spent in Direct Patient Care:  I personally spent 30 minutes on the unit in direct patient care. The direct patient care time included face-to-face time with the patient, reviewing the patient's chart, communicating with other professionals, and coordinating care. Greater than 50% of this time was spent in counseling or coordinating care with the patient regarding goals of hospitalization, psycho-education, and discharge planning needs.  Past Psychiatric  History: see admission H&P  Past Medical History:  Past Medical History:  Diagnosis Date  . Chest tightness 04/18/2017  . Dysthymia 04/19/2017   Referral downstairs to behavioral health for counseling, patient is advised on  mental health safety precautions, can come to me if he would like to discuss meds  . Eustachian tube dysfunction, bilateral 04/19/2017   Canals clear and tympanic membranes normal, advised OTC Flonase and or second generation antihistamine Claritin/Zyrtec/Allegra or similar generic  . Sinusitis 05/18/2016  . Vision changes 05/18/2016   Family History: see admission H&P  Family Psychiatric  History: see admission H&P  Social History:  Social History   Substance and Sexual Activity  Alcohol Use No     Social History   Substance and Sexual Activity  Drug Use Yes  . Types: Marijuana   Comment: Delta 8    Social History   Socioeconomic History  . Marital status: Married    Spouse name: Not on file  . Number of children: Not on file  . Years of education: Not on file  . Highest education level: Not on file  Occupational History  . Not on file  Tobacco Use  . Smoking status: Never Smoker  . Smokeless tobacco: Never Used  Vaping Use  . Vaping Use: Every day  . Substances: THC  Substance and Sexual Activity  . Alcohol use: No  . Drug use: Yes    Types: Marijuana    Comment: Delta 8  . Sexual activity: Not Currently  Other Topics Concern  . Not on file  Social History Narrative  . Not on file   Social Determinants of Health   Financial Resource Strain: Not on file  Food Insecurity: Not on file  Transportation Needs: Not on file  Physical Activity: Not on file  Stress: Not on file  Social Connections: Not on file   Sleep: Good  Appetite:  Fair  Current Medications: Current Facility-Administered Medications  Medication Dose Route Frequency Provider Last Rate Last Admin  . acetaminophen (TYLENOL) tablet 650 mg  650 mg Oral Q6H PRN Prescilla Sours, PA-C      . alum & mag hydroxide-simeth (MAALOX/MYLANTA) 200-200-20 MG/5ML suspension 30 mL  30 mL Oral Q4H PRN Margorie John W, PA-C      . carbamazepine (TEGRETOL) chewable tablet 200 mg  200 mg Oral TID Sharma Covert, MD   200 mg at 11/01/20 1717  . cloZAPine (CLOZARIL) tablet 50 mg  50 mg Oral QHS Sharma Covert, MD      . docusate sodium (COLACE) capsule 100 mg  100 mg Oral Daily Val[place] Arabie E, MD      . feeding supplement (ENSURE ENLIVE / ENSURE PLUS) liquid 237 mL  237 mL Oral BID BM Lovena Le, Cody W, PA-C   237 mL at 11/01/20 1737  . haloperidol (HALDOL) tablet 10 mg  10 mg Oral Q6H PRN Sharma Covert, MD       Or  . haloperidol lactate (HALDOL) injection 10 mg  10 mg Intramuscular Q6H PRN Sharma Covert, MD      . haloperidol (HALDOL) tablet 15 mg  15 mg Oral TID Sharma Covert, MD   15 mg at 11/01/20 1717   Or  . haloperidol lactate (HALDOL) injection 15 mg  15 mg Intramuscular TID Sharma Covert, MD      . hydrOXYzine (ATARAX/VISTARIL) tablet 25 mg  25 mg Oral TID PRN Prescilla Sours, PA-C   25 mg  at 11/01/20 0903  . ziprasidone (GEODON) injection 20 mg  20 mg Intramuscular Q12H PRN Harlow Asa, MD       And  . LORazepam (ATIVAN) tablet 2 mg  2 mg Oral Q6H PRN Nelda Marseille, Keaira Whitehurst E, MD      . LORazepam (ATIVAN) tablet 2 mg  2 mg Oral Q6H PRN Nelda Marseille, Jeslie Lowe E, MD      . magnesium hydroxide (MILK OF MAGNESIA) suspension 30 mL  30 mL Oral Daily PRN Margorie John W, PA-C      . metoprolol tartrate (LOPRESSOR) tablet 25 mg  25 mg Oral BID Sharma Covert, MD   25 mg at 11/01/20 1717  . traZODone (DESYREL) tablet 50 mg  50 mg Oral QHS PRN Dory, [place], PA-C   50 mg at 10/31/20 2246    Lab Results:  Results for orders placed or performed during the hospital encounter of 10/27/20 (from the past 48 hour(s))  POC SARS Coronavirus 2 Ag     Status: None   Collection Time: 11/01/20  6:38 PM  Result Value Ref Range   SARS Coronavirus 2 Ag NEGATIVE NEGATIVE    Comment: (NOTE) SARS-CoV-2 antigen NOT DETECTED.   Negative results are presumptive.  Negative results do not preclude SARS-CoV-2 infection and should not be used as the sole basis for treatment or other patient  management decisions, including infection  control decisions, particularly in the presence of clinical signs and  symptoms consistent with COVID-19, or in those who have been in contact with the virus.  Negative results must be combined with clinical observations, patient history, and epidemiological information. The expected result is Negative.  Fact Sheet for Patients: HandmadeRecipes.com.cy  Fact Sheet for Healthcare Providers: FuneralLife.at  This test is not yet approved or cleared by the [place] FDA and  has been authorized for detection and/or diagnosis of SARS-CoV-2 by FDA under an Emergency Use Authorization (EUA).  This EUA will remain in effect (meaning this test can be used) for the duration of  the COV ID-19 declaration under Section 564(b)(1) of the Act, 21 U.S.C. section 360bbb-3(b)(1), unless the authorization is terminated or revoked sooner.      Blood Alcohol level:  No results found for: [facility]  Metabolic Disorder Labs: Lab Results  Component Value Date   HGBA1C 4.3 (L) 10/24/2020   MPG 76.71 10/24/2020   No results found for: PROLACTIN Lab Results  Component Value Date   CHOL 214 (H) 10/24/2020   TRIG 57 10/24/2020   HDL 57 10/24/2020   CHOLHDL 3.8 10/24/2020   VLDL 11 10/24/2020   LDLCALC 146 (H) 10/24/2020    Physical Findings: AIMS: Facial and Oral Movements Muscles of Facial Expression: None, normal Lips and Perioral Area: None, normal Jaw: None, normal Tongue: None, normal,Extremity Movements Upper (arms, wrists, hands, fingers): None, normal Lower (legs, knees, ankles, toes): None, normal, Trunk Movements Neck, shoulders, hips: None, normal, Overall Severity Severity of abnormal movements (highest score from questions above): None, normal Incapacitation due to abnormal movements: None, normal Patient's awareness of abnormal movements (rate only patient's report): No Awareness, Dental  Status Current problems with teeth and/or dentures?: No Does patient usually wear dentures?: No      Musculoskeletal: Strength & Muscle Tone: within normal limits Gait & Station: untested - resting in bed Patient leans: N/A  Psychiatric Specialty Exam: Physical Exam Vitals reviewed.  HENT:     Head: Normocephalic.  Pulmonary:     Effort: Pulmonary effort is normal.  Neurological:     Mental Status: He is alert.        Blood pressure 111/87, pulse 75, temperature 98.1 F (36.7 C), temperature source Oral, resp. rate 18, height 5\' 7"  (1.702 m), weight 56.2 kg, SpO2 97 %.Body mass index is 19.42 kg/m.  General Appearance: disheveled, resting in bed, well engaged on exam  Eye Contact:  Fair  Speech:  Clear and Coherent and Normal Rate  Volume:  Normal  Mood:  described as improving - appears calm  Affect:  Constricted  Thought Process:  Goal Directed  Orientation:  Oriented to self, year, month, and city  Thought Content:  Denies ideas of reference or paranoia; no delusions noted on exam and does not appear to be grossly responding to internal/external stimuli on exam  Suicidal Thoughts:  Denied  Homicidal Thoughts:  Denied  Memory:  Immediate;   Fair  Judgement:  Improving  Insight:  Improving  Psychomotor Activity:  Normal  Concentration:  Concentration: Fair and Attention Span: Fair  Recall:  [employer] of Knowledge:  Fair  Language:  Good  Akathisia:  No  Assets:  Communication Skills Desire for Improvement Resilience  ADL's:  Impaired  Cognition:  WNL  Sleep:  Number of Hours: 8.5   Treatment Plan Summary: Diagnoses / Active Problems: Unspecified schizophrenia spectrum and other psychotic d/o (r/o schizophreniform d/o, r/o MDD with psychotic features, r/o substance induced psychotic d/o, r/o psychotic d/o secondary to a general medical condition) Cannabis use d/o  PLAN: 1. Safety and Monitoring:  -- Voluntary admission to inpatient psychiatric unit for  safety, stabilization and treatment  -- Daily contact with patient to assess and evaluate symptoms and progress in treatment  -- Patient's case to be discussed in multi-disciplinary team meeting  -- Observation Level : 1:1  -- Vital signs:  q12 hours  -- Precautions: suicide  2. 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 (r/o schizophreniform d/o, r/o MDD with psychotic features, r/o substance induced psychotic d/o, r/o psychotic d/o secondary to a general medical condition) -- Reduce Haldol to 10mg  tid po- he appears to be clearing with sleep overnight and refuses start of Clozaril after discussion of r/b/se/a of medication.  -- Start Cogentin 1mg  bid prophylactically in the context of high dose antipsychotic use with Colace 100mg  daily to avoid constipation with anticholinergic med -- Continue Tegretol 200mg  tid for agitation   -- Change Ativan to 2mg  po or IM q6 hours PRN severe agitation -- Continue Trazodone 50mg  po qhs PRN insomnia -- Continue Vistaril 25mg  q 6 hours PRN anxiety -- Continue Haldol 10mg  IM q6 hours PRN severe agitation/aggression  -- Metabolic profile and EKG monitoring obtained while on an atypical antipsychotic (BMI:19.42; Lipid Panel:cholesterol 214, triglycerides 57, HDL 57, LDL 146;  HbgA1c:4.3 QTc:457)   -- In context of new onset psychotic symptoms, will check additional labs to r/o an organic etiology to his presentation when he will agree to lab draw (CRP, ESR, ANA, HIV, ceruloplasmin, RPR, B12, and heavy metal screen) and will get Head CT noncontrast when he is able to leave the unit for neuroimagaing (WBC on admission 6.4, H/H 16.1/42.7, platelets 225; TSH 1.422; UDS positive for marijuana)   -- Short Term Goals: Ability to demonstrate self-control will improve  -- Long Term Goals: Improvement in symptoms so as ready for discharge   Cannabis Use d/o  -- Discussed need to abstain from illicit and synthetic  substance after discharge  -- Short Term  Goals: Ability to identify triggers associated with substance abuse/mental health issues will improve  -- Long Term Goals: Improvement in symptoms so as ready for discharge    High Risk Medication Use: -- The complexity of this patient's case involves drug therapy with Tegretol which requires intensive monitoring for toxicity. Will check Tegretol level, CBC, and CMP for monitoring.   3. Medical Issues Being Addressed:   Mild hyponatremia (Na+ 134)  -- Rechecking CMP  4. Discharge Planning:   -- Social work and case management to assist with discharge planning and identification of hospital follow-up needs prior to discharge  -- Estimated LOS: 7-10 days  -- Discharge Concerns: Need to establish a safety plan; Medication compliance and effectiveness  -- Discharge Goals: Return home with outpatient referrals for mental health follow-up including medication management/psychotherapy  Harlow Asa, MD, FAPA 11/02/2020, 10:14 AM

## 2020-11-01 NOTE — Progress Notes (Signed)
[facility] MD Progress Note  11/01/2020 1:45 PM Cody Roberts  MRN:  [id number] Subjective:  Patient is a 25 year old male with a past psychiatric history significant for major depression who presented to the [facility] on 10/24/2020 secondary to paranoid delusions.  Objective: Patient is seen and examined.  Patient is a 25 year old male with the above-stated past psychiatric history who is seen in follow-up.  Unfortunately he is essentially unchanged.  He continues to not sleep.  This morning he attempted to rip the heater off the wall, because he stated "the arrow showed me where my PC was".  When asked about how the computer would have gotten back there in the first place he was unable to answer that question.  He remains bizarre and psychotic.  Yesterday he received Haldol 15 mg IM or p.o. 3 times daily.  That really had no benefit.  I had also written for mirtazapine and attempt to at least get him to be able to sleep.  That was of no benefit.  This morning given his psychotic state, disorganization I added back Zyprexa 5 mg p.o. twice daily on top of the Haldol.  Unfortunately he tried to take the heater off the wall after he received this medication.  His vital signs are are stable.  He is afebrile.  Again he slept 0 hours last night.  Pulse oximetry on room air was 97%.  No new laboratories.  Principal Problem: Schizophreniform disorder (HCC) Diagnosis: Principal Problem:   Schizophreniform disorder (HCC) Active Problems:   Substance-induced psychotic disorder with hallucinations (HCC)  Total Time spent with patient: 30 minutes  Past Psychiatric History: See admission H&P  Past Medical History:  Past Medical History:  Diagnosis Date  . Chest tightness 04/18/2017  . Dysthymia 04/19/2017   Referral downstairs to behavioral health for counseling, patient is advised on mental health safety precautions, can come to me if he would like to discuss meds  . Eustachian tube  dysfunction, bilateral 04/19/2017   Canals clear and tympanic membranes normal, advised OTC Flonase and or second generation antihistamine Claritin/Zyrtec/Allegra or similar generic  . Sinusitis 05/18/2016  . Vision changes 05/18/2016   History reviewed. No pertinent surgical history. Family History: History reviewed. No pertinent family history. Family Psychiatric  History: See admission H&P Social History:  Social History   Substance and Sexual Activity  Alcohol Use No     Social History   Substance and Sexual Activity  Drug Use Yes  . Types: Marijuana   Comment: Delta 8    Social History   Socioeconomic History  . Marital status: Married    Spouse name: Not on file  . Number of children: Not on file  . Years of education: Not on file  . Highest education level: Not on file  Occupational History  . Not on file  Tobacco Use  . Smoking status: Never Smoker  . Smokeless tobacco: Never Used  Vaping Use  . Vaping Use: Every day  . Substances: THC  Substance and Sexual Activity  . Alcohol use: No  . Drug use: Yes    Types: Marijuana    Comment: Delta 8  . Sexual activity: Not Currently  Other Topics Concern  . Not on file  Social History Narrative  . Not on file   Social Determinants of Health   Financial Resource Strain: Not on file  Food Insecurity: Not on file  Transportation Needs: Not on file  Physical Activity: Not on file  Stress:  Not on file  Social Connections: Not on file   Additional Social History:                         Sleep: Poor  Appetite:  Poor  Current Medications: Current Facility-Administered Medications  Medication Dose Route Frequency Provider Last Rate Last Admin  . acetaminophen (TYLENOL) tablet 650 mg  650 mg Oral Q6H PRN Jaclyn Shaggy, PA-C      . alum & mag hydroxide-simeth (MAALOX/MYLANTA) 200-200-20 MG/5ML suspension 30 mL  30 mL Oral Q4H PRN Melbourne Abts W, PA-C      . carbamazepine (TEGRETOL) chewable tablet 200  mg  200 mg Oral TID Antonieta Pert, MD   200 mg at 11/01/20 1158  . feeding supplement (ENSURE ENLIVE / ENSURE PLUS) liquid 237 mL  237 mL Oral BID BM Yanis, Larin W, PA-C   237 mL at 11/01/20 1039  . haloperidol (HALDOL) tablet 15 mg  15 mg Oral TID Antonieta Pert, MD   15 mg at 11/01/20 1157   Or  . haloperidol lactate (HALDOL) injection 15 mg  15 mg Intramuscular TID Antonieta Pert, MD      . hydrOXYzine (ATARAX/VISTARIL) tablet 25 mg  25 mg Oral TID PRN Jaclyn Shaggy, PA-C   25 mg at 11/01/20 0903  . LORazepam (ATIVAN) tablet 2 mg  2 mg Oral Q6H PRN Antonieta Pert, MD   2 mg at 10/31/20 2246   And  . ziprasidone (GEODON) injection 20 mg  20 mg Intramuscular Q6H PRN Antonieta Pert, MD   20 mg at 11/01/20 0317  . magnesium hydroxide (MILK OF MAGNESIA) suspension 30 mL  30 mL Oral Daily PRN Melbourne Abts W, PA-C      . metoprolol tartrate (LOPRESSOR) tablet 25 mg  25 mg Oral BID Antonieta Pert, MD   25 mg at 11/01/20 0904  . OLANZapine zydis (ZYPREXA) disintegrating tablet 5 mg  5 mg Oral BID Antonieta Pert, MD   5 mg at 11/01/20 0904  . traZODone (DESYREL) tablet 50 mg  50 mg Oral QHS PRN Rydan, Gulyas, PA-C   50 mg at 10/31/20 2246    Lab Results: No results found for this or any previous visit (from the past 48 hour(s)).  Blood Alcohol level:  No results found for: [facility]  Metabolic Disorder Labs: Lab Results  Component Value Date   HGBA1C 4.3 (L) 10/24/2020   MPG 76.71 10/24/2020   No results found for: PROLACTIN Lab Results  Component Value Date   CHOL 214 (H) 10/24/2020   TRIG 57 10/24/2020   HDL 57 10/24/2020   CHOLHDL 3.8 10/24/2020   VLDL 11 10/24/2020   LDLCALC 146 (H) 10/24/2020    Physical Findings: AIMS: Facial and Oral Movements Muscles of Facial Expression: None, normal Lips and Perioral Area: None, normal Jaw: None, normal Tongue: None, normal,Extremity Movements Upper (arms, wrists, hands, fingers): None, normal Lower (legs,  knees, ankles, toes): None, normal, Trunk Movements Neck, shoulders, hips: None, normal, Overall Severity Severity of abnormal movements (highest score from questions above): None, normal Incapacitation due to abnormal movements: None, normal Patient's awareness of abnormal movements (rate only patient's report): No Awareness, Dental Status Current problems with teeth and/or dentures?: No Does patient usually wear dentures?: No  CIWA:    COWS:     Musculoskeletal: Strength & Muscle Tone: within normal limits Gait & Station: normal Patient leans: N/A  Psychiatric Specialty  Exam:  Presentation  General Appearance: Disheveled  Eye Contact:Fair  Speech:Normal Rate  Speech Volume:Decreased  Handedness:Right   Mood and Affect  Mood:Dysphoric; Anxious  Affect:Blunt   Thought Process  Thought Processes:Disorganized  Descriptions of Associations:Loose  Orientation:Partial  Thought Content:Delusions; Paranoid Ideation  History of Schizophrenia/Schizoaffective disorder:No  Duration of Psychotic Symptoms:Greater than six months  Hallucinations:Hallucinations: Auditory; Visual  Ideas of Reference:Delusions; Paranoia  Suicidal Thoughts:Suicidal Thoughts: No  Homicidal Thoughts:Homicidal Thoughts: No   Sensorium  Memory:Immediate Poor; Recent Poor; Remote Poor  Judgment:Poor  Insight:Lacking   Executive Functions  Concentration:Fair  Attention Span:Fair  Recall:Poor  Fund of Knowledge:Fair  Language:Fair   Psychomotor Activity  Psychomotor Activity:Psychomotor Activity: Increased   Assets  Assets:Desire for Improvement; Resilience; Social Support; Housing   Sleep  Sleep:Sleep: Poor Number of Hours of Sleep: 0    Physical Exam: Physical Exam Vitals and nursing note reviewed.  HENT:     Head: Normocephalic and atraumatic.  Pulmonary:     Effort: Pulmonary effort is normal.  Neurological:     General: No focal deficit present.      Mental Status: He is alert.    ROS Blood pressure 106/74, pulse 74, temperature 98.1 F (36.7 C), temperature source Oral, resp. rate 18, height 5\' 7"  (1.702 m), weight 56.2 kg, SpO2 97 %. Body mass index is 19.42 kg/m.   Treatment Plan Summary: Daily contact with patient to assess and evaluate symptoms and progress in treatment, Medication management and Plan : Patient is seen and examined.  Patient is a 25 year old male with the above-stated past psychiatric history who is seen in follow-up.   Diagnosis: 1. Schizophreniform disorder (but most likely schizophrenia when you look at his symptoms and timeframe. This would be by history alone). 2. Cannabis dependence  Pertinent findings on examination today: 1.  Continue auditory and visual hallucinations. 2.  Continued paranoid delusions. 3.  Sleep remains extremely poor.  Plan: 1.  Add Tegretol 200 mg p.o. 3 times daily for mood stability and augment treatment of antipsychotic medications. 2.  Continue haloperidol 15 mg IM or p.o. 3 times daily for psychosis. 3.  Continue Ativan 2 mg p.o. every 6 hours as needed anxiety or agitation. 4.  Continue Geodon 20 mg IM every 6 hours as needed agitation. 5.  Stop trazodone. 6.  Start doxepin 100 mg p.o. nightly for sleep and agitation. 7.  Attempt to get EKG today. 8.  Continue metoprolol short acting 25 mg p.o. twice daily for tachycardia and blood pressure. 9.  We still need to scan his head given his previous arachnoid cyst and concern by the mother that it may have gotten bigger.  Previous neurology note did not anticipate any growth of this, but given the seriousness of his psychosis I would like to at least image him.  This will have to be after psychosis is far better control than he is right now. 10.  Add Zyprexa 5 mg p.o. twice daily for psychosis and agitation. 11.  Disposition planning-in progress.   25, MD 11/01/2020, 1:45 PM

## 2020-11-01 NOTE — Progress Notes (Signed)
1:1 Note: Patient maintained on constant supervision for safety.  Medication given as prescribed.  Continues to respond to internal and external stimuli.  Mumbling to himself and reaching out and grabbing things in the air.  Patient is disorganized with tangential thought process and speech.  Routine safety checks maintained.  Continues to need redirections on the unit.  Patient is safe on the unit with supervision.

## 2020-11-01 NOTE — Progress Notes (Signed)
1:1 Note: Patient maintained on constant supervision for safety.  Medication given as prescribed.  Routine safety checks maintained.  Patient pacing in his room,  attempted to pull the heater off the wall.  Continues to need a lot of redirection on the unit.  Food and fluid intake encouraged.  Patient is safe on the unit with supervision.

## 2020-11-01 NOTE — Progress Notes (Signed)
Patient has yet to go to sleep. He started to get louder and could be heard at the nursing statin with him cursing the sitter because he wanted to go walk his dog. He received Geodon 20 mg IM.

## 2020-11-01 NOTE — Progress Notes (Signed)
Pharmacy - Clozapine     This patient's order has been reviewed for prescribing contraindications.   Labs:  ANC = 4600 on 10/24/2020  The medication is being dispensed pursuant to the FDA REMS suspension order of 07/09/20 that allows for dispensing without a patient REMS dispense authorization (RDA).    Greer Pickerel, PharmD, BCPS 11/01/2020 6:30 PM

## 2020-11-02 ENCOUNTER — Ambulatory Visit (HOSPITAL_COMMUNITY)
Admission: RE | Admit: 2020-11-02 | Discharge: 2020-11-02 | Disposition: A | Discharge status: Home / Self Care | Payer: BC Managed Care – PPO | Source: Home / Self Care | Attending: Emergency Medicine | Admitting: Emergency Medicine

## 2020-11-02 DIAGNOSIS — F1210 Cannabis abuse, uncomplicated: Secondary | ICD-10-CM

## 2020-11-02 DIAGNOSIS — F29 Unspecified psychosis not due to a substance or known physiological condition: Secondary | ICD-10-CM

## 2020-11-02 MED ORDER — LORAZEPAM 2 MG/ML IJ SOLN: 2.0000 mg | Freq: Four times a day (QID) | INTRAMUSCULAR | Status: DC | PRN

## 2020-11-02 MED ORDER — ZIPRASIDONE MESYLATE 20 MG IM SOLR: 20.0000 mg | Freq: Two times a day (BID) | INTRAMUSCULAR | Status: DC | PRN

## 2020-11-02 MED ORDER — BENZTROPINE MESYLATE 1 MG PO TABS
1.0000 mg | ORAL_TABLET | Freq: Two times a day (BID) | ORAL | Status: DC
  Administered 2020-11-02 – 2020-11-03 (×2): 1 mg via ORAL
  Filled 2020-11-02 (×5): qty 1

## 2020-11-02 MED ORDER — HALOPERIDOL LACTATE 5 MG/ML IJ SOLN
10.0000 mg | Freq: Three times a day (TID) | INTRAMUSCULAR | Status: DC
  Filled 2020-11-02 (×6): qty 2

## 2020-11-02 MED ORDER — LORAZEPAM 1 MG PO TABS: 2.0000 mg | ORAL_TABLET | Freq: Four times a day (QID) | ORAL | Status: DC | PRN

## 2020-11-02 MED ORDER — BENZTROPINE MESYLATE 1 MG PO TABS: 1.0000 mg | ORAL_TABLET | Freq: Two times a day (BID) | ORAL | Status: DC

## 2020-11-02 MED ORDER — DOCUSATE SODIUM 100 MG PO CAPS
100.0000 mg | ORAL_CAPSULE | Freq: Every day | ORAL | Status: DC
  Administered 2020-11-02 – 2020-11-03 (×2): 100 mg via ORAL
  Filled 2020-11-02 (×4): qty 1

## 2020-11-02 MED ORDER — HALOPERIDOL 5 MG PO TABS
10.0000 mg | ORAL_TABLET | Freq: Three times a day (TID) | ORAL | Status: DC
  Administered 2020-11-02: 10 mg via ORAL
  Filled 2020-11-02 (×6): qty 2

## 2020-11-02 NOTE — Progress Notes (Signed)
Adult Psychoeducational Group Note  Date:  11/02/2020 Time:  11:16 PM  Group Topic/Focus:  Wrap-Up Group:   The focus of this group is to help patients review their daily goal of treatment and discuss progress on daily workbooks.  Participation Level:  Did Not Attend  Participation Quality:  Did Not Attend  Affect:  Did Not Attend  Cognitive:  Did Not Attend  Insight: None  Engagement in Group:  Did Not Attend  Modes of Intervention:  Did Not Attend  Additional Comments:  Pt did not attend evening wrap up group tonight.  Felipa Furnace 11/02/2020, 11:16 PM

## 2020-11-02 NOTE — Progress Notes (Signed)
Pharmacy:  Clozapine  Pt enrolled in clozapine rems. Requires weekly CBC with dif  Pts id :  [id number]   Peggye Fothergill, Pharm D

## 2020-11-02 NOTE — Progress Notes (Signed)
Recreation Therapy Notes  Date: 3.15.22 Time: 0945 Location: 500 Hall Day Room   Group Topic: Leisure Education    Goal Area(s) Addresses:  Patient will successfully identify benefits of leisure participation. Patient will successfully identify ways to access leisure activities. Patient will identify benefits of making time for leisure activities.  Intervention: Markers, Magazines, Scissors, Glue Sticks, Construction Paper   Activity:  Leisure PSA.  Patients were to create a public service announcement that explains leisure and provides examples of what leisure can be.  Patients were to provide examples of outdoor activities, indoor activities and activities that can be done individually.   Education:  Leisure Education, Discharge Planning   Education Outcome: Acknowledges education   Clinical Observations/Feedback: Pt did not attend group session.     Marjette Lindsay, LRT/CTRS         Lindsay, Marjette A 11/02/2020 10:55 AM 

## 2020-11-02 NOTE — Progress Notes (Signed)
Nursing 1:1 note D:Pt observed sleeping in bed with eyes closed. RR even and unlabored. No distress noted. A: 1:1 observation continues for safety  R: pt remains safe  

## 2020-11-02 NOTE — Progress Notes (Signed)
1:1 Note: Patient in bed sleeping for majority of this shift.  No behavioral issues noted.  Routine safety checks maintained.  Patient is safe on the unit with supervision.

## 2020-11-02 NOTE — Progress Notes (Signed)
   11/02/20 0500  Sleep  Number of Hours 8.5

## 2020-11-02 NOTE — Progress Notes (Signed)
1:1 Note: Patient is calm and cooperative.  Patient returned from [facility] after CT scan appointment.  Food and fluid intake offered.  Patient in bed resting.   No behavioral issues noted.  Patient is safe on the unit with supervision.

## 2020-11-02 NOTE — Progress Notes (Signed)
1:1 Note: Patient maintained on constant supervision for safety.  Patient is alert and oriented to person,place and time.  Presents with a flat affect and depressed mood.  Medication given as prescribed.  Patient is calm and cooperative.  No behavioral issues noted.  Routine safety checks maintained.  Patient is safe on the unit.

## 2020-11-03 DIAGNOSIS — G939 Disorder of brain, unspecified: Secondary | ICD-10-CM

## 2020-11-03 LAB — CBC WITH DIFFERENTIAL/PLATELET
Abs Immature Granulocytes: 0.01 10*3/uL (ref 0.00–0.07)
Basophils Absolute: 0 10*3/uL (ref 0.0–0.1)
Basophils Relative: 0 %
Eosinophils Absolute: 0 10*3/uL (ref 0.0–0.5)
Eosinophils Relative: 1 %
HCT: 45.7 % (ref 39.0–52.0)
Hemoglobin: 15.5 g/dL (ref 13.0–17.0)
Immature Granulocytes: 0 %
Lymphocytes Relative: 31 %
Lymphs Abs: 1.4 10*3/uL (ref 0.7–4.0)
MCH: 29.6 pg (ref 26.0–34.0)
MCHC: 33.9 g/dL (ref 30.0–36.0)
MCV: 87.2 fL (ref 80.0–100.0)
Monocytes Absolute: 0.3 10*3/uL (ref 0.1–1.0)
Monocytes Relative: 7 %
Neutro Abs: 2.7 10*3/uL (ref 1.7–7.7)
Neutrophils Relative %: 61 %
Platelets: 184 10*3/uL (ref 150–400)
RBC: 5.24 MIL/uL (ref 4.22–5.81)
RDW: 12.6 % (ref 11.5–15.5)
WBC: 4.5 10*3/uL (ref 4.0–10.5)
nRBC: 0 % (ref 0.0–0.2)

## 2020-11-03 LAB — C-REACTIVE PROTEIN: CRP: <0.5 mg/dL (ref <1.0)

## 2020-11-03 LAB — COMPREHENSIVE METABOLIC PANEL
ALT: 31 U/L (ref 0–44)
AST: 31 U/L (ref 15–41)
Albumin: 4.7 g/dL (ref 3.5–5.0)
Alkaline Phosphatase: 46 U/L (ref 38–126)
Anion gap: 11 (ref 5–15)
BUN: 16 mg/dL (ref 6–20)
CO2: 27 mmol/L (ref 22–32)
Calcium: 9.4 mg/dL (ref 8.9–10.3)
Chloride: 100 mmol/L (ref 98–111)
Creatinine, Ser: 1.24 mg/dL (ref 0.61–1.24)
GFR, Estimated: >60 mL/min (ref >60)
Glucose, Bld: 138 mg/dL — ABNORMAL HIGH (ref 70–99)
Potassium: 3.8 mmol/L (ref 3.5–5.1)
Sodium: 138 mmol/L (ref 135–145)
Total Bilirubin: 1.3 mg/dL — ABNORMAL HIGH (ref 0.3–1.2)
Total Protein: 7.1 g/dL (ref 6.5–8.1)

## 2020-11-03 LAB — SEDIMENTATION RATE: Sed Rate: 0 mm/hr (ref 0–16)

## 2020-11-03 LAB — HIV ANTIBODY (ROUTINE TESTING W REFLEX): HIV Screen 4th Generation wRfx: NONREACTIVE

## 2020-11-03 LAB — CARBAMAZEPINE LEVEL, TOTAL: Carbamazepine Lvl: 5.7 ug/mL (ref 4.0–12.0)

## 2020-11-03 LAB — VITAMIN B12: Vitamin B-12: 335 pg/mL (ref 180–914)

## 2020-11-03 MED ORDER — HALOPERIDOL LACTATE 5 MG/ML IJ SOLN
5.0000 mg | Freq: Three times a day (TID) | INTRAMUSCULAR | Status: DC
  Filled 2020-11-03 (×3): qty 1

## 2020-11-03 MED ORDER — TRAZODONE HCL 50 MG PO TABS
50.0000 mg | ORAL_TABLET | Freq: Every evening | ORAL | Status: DC | PRN
Start: 2020-11-03 — End: 2020-11-08

## 2020-11-03 MED ORDER — LORAZEPAM 2 MG PO TABS
2.0000 mg | ORAL_TABLET | Freq: Four times a day (QID) | ORAL | 0 refills | Status: DC | PRN
Start: 2020-11-03 — End: 2020-11-05

## 2020-11-03 MED ORDER — CARBAMAZEPINE 100 MG PO CHEW
200.0000 mg | CHEWABLE_TABLET | Freq: Two times a day (BID) | ORAL | Status: DC
  Administered 2020-11-03: 200 mg via ORAL
  Filled 2020-11-03 (×4): qty 2

## 2020-11-03 MED ORDER — CARBAMAZEPINE 100 MG PO CHEW
200.0000 mg | CHEWABLE_TABLET | Freq: Two times a day (BID) | ORAL | 0 refills | Status: DC
Start: 2020-11-03 — End: 2020-11-08

## 2020-11-03 MED ORDER — CARBAMAZEPINE 200 MG PO TABS
200.0000 mg | ORAL_TABLET | Freq: Two times a day (BID) | ORAL | Status: DC
  Administered 2020-11-03: 200 mg via ORAL
  Filled 2020-11-03 (×3): qty 1

## 2020-11-03 MED ORDER — LORAZEPAM 2 MG/ML IJ SOLN: 2.0000 mg | Freq: Four times a day (QID) | INTRAMUSCULAR | 0 refills | Status: DC | PRN

## 2020-11-03 MED ORDER — HALOPERIDOL 5 MG PO TABS: 5.0000 mg | ORAL_TABLET | Freq: Three times a day (TID) | ORAL | Status: DC

## 2020-11-03 MED ORDER — BENZTROPINE MESYLATE 0.5 MG PO TABS: 0.5000 mg | ORAL_TABLET | Freq: Two times a day (BID) | ORAL | Status: DC

## 2020-11-03 MED ORDER — CARBAMAZEPINE 100 MG PO CHEW: 200.0000 mg | CHEWABLE_TABLET | Freq: Two times a day (BID) | ORAL | 0 refills | Status: DC

## 2020-11-03 MED ORDER — HALOPERIDOL 5 MG PO TABS
5.0000 mg | ORAL_TABLET | Freq: Three times a day (TID) | ORAL | Status: DC
  Administered 2020-11-03: 5 mg via ORAL
  Filled 2020-11-03 (×6): qty 1

## 2020-11-03 MED ORDER — HALOPERIDOL 5 MG PO TABS
5.0000 mg | ORAL_TABLET | Freq: Four times a day (QID) | ORAL | Status: DC | PRN
Start: 2020-11-03 — End: 2020-11-05

## 2020-11-03 MED ORDER — HALOPERIDOL LACTATE 5 MG/ML IJ SOLN: 5.0000 mg | Freq: Four times a day (QID) | INTRAMUSCULAR | Status: DC | PRN

## 2020-11-03 MED ORDER — HYDROXYZINE HCL 25 MG PO TABS: 25.0000 mg | ORAL_TABLET | Freq: Three times a day (TID) | ORAL | 0 refills | Status: DC | PRN

## 2020-11-03 MED ORDER — HALOPERIDOL LACTATE 5 MG/ML IJ SOLN
5.0000 mg | Freq: Four times a day (QID) | INTRAMUSCULAR | Status: DC | PRN
Start: 2020-11-03 — End: 2020-11-05

## 2020-11-03 MED ORDER — METOPROLOL TARTRATE 25 MG PO TABS: 25.0000 mg | ORAL_TABLET | Freq: Two times a day (BID) | ORAL | Status: DC

## 2020-11-03 MED ORDER — ENSURE ENLIVE PO LIQD: 237.0000 mL | Freq: Two times a day (BID) | ORAL | 12 refills | Status: DC

## 2020-11-03 MED ORDER — BENZTROPINE MESYLATE 0.5 MG PO TABS
0.5000 mg | ORAL_TABLET | Freq: Two times a day (BID) | ORAL | Status: DC
  Administered 2020-11-03: 0.5 mg via ORAL
  Filled 2020-11-03 (×4): qty 1

## 2020-11-03 MED ORDER — HALOPERIDOL 5 MG PO TABS
5.0000 mg | ORAL_TABLET | Freq: Four times a day (QID) | ORAL | Status: DC | PRN
  Administered 2020-11-03: 5 mg via ORAL

## 2020-11-03 MED ORDER — HALOPERIDOL LACTATE 5 MG/ML IJ SOLN: 10.0000 mg | Freq: Three times a day (TID) | INTRAMUSCULAR | Status: DC

## 2020-11-03 NOTE — Progress Notes (Signed)
Spoke to [place] with Patient Placement (PP). PP will notify [facility] at [phone] when there is a bed available at [facility]. Thanks

## 2020-11-03 NOTE — Progress Notes (Signed)
Close observation note:  Patient and MHT went to cafeteria for lunch tray and returned without incident. Safety maintained, close observation continues.

## 2020-11-03 NOTE — Progress Notes (Signed)
Recreation Therapy Notes  Date: 3.16.22 Time: 1000 Location: 500 Hall Dayroom   Group Topic: Communication, Team Building, Problem Solving  Goal Area(s) Addresses:  Patient will effectively work with peer towards shared goal.  Patient will identify skills used to make activity successful.  Patient will share challenges and verbalize solution-driven approaches used. Patient will identify how skills used during activity can be used to reach post d/c goals.   Behavioral Response: Engaged  Intervention: STEM Activity   Activity: [employer]. Patients were provided the following materials: 2 drinking straws, 5 rubber bands, 5 paper clips, 2 index cards and 2 drinking cups. Using the provided materials patients were asked to build a launching mechanism to launch a ping pong ball across the room, approximately 10 feet. Patients were divided into teams of 3-5. Instructions required all materials be incorporated into the device, functionality of items left to the peer group's discretion.  Education: Pharmacist, community, Scientist, physiological, Air cabin crew, Building control surveyor.   Education Outcome: Acknowledges education/In group clarification offered/Needs additional education.   Clinical Observations/Feedback: Pt was a lot calmer, attentive and appropriate during group.  Pt offered ideas and worked well with his partner.  During debriefing, pt stated his team used communication and consideration to complete activity.    Caroll Rancher, LRT/CTRS    Caroll Rancher A 11/03/2020 1:04 PM

## 2020-11-03 NOTE — Discharge Summary (Addendum)
Physician Discharge Summary Note  Patient:  Cody Roberts is an 25 y.o., male MRN:  [id number] DOB:  [birth year] Patient phone:  [phone] (home)  Patient address:   [address] [place] [address],  Total Time spent with patient: 30 minutes  Date of Admission:  10/27/2020 Date of Discharge: 11/03/2020  Reason for Admission:  (From MD's admission note): Patient is a 25 year old male with a past psychiatric history significant for major depression who presented to the [facility] on 10/24/2020 secondary to paranoid delusions. The patient was brought to the facility by his mother. In the notes from the behavioral health center he stated he felt as though he had been watching himself on television, and was concerned that things were "going to happen to me". On examination this morning the patient stated that he is felt fearful that people are following him or watching him since at least November or December 2021. He stated that he believes that is the correct timeframe. He also admitted to auditory hallucinations. Review of the electronic medical record revealed that he had been treated by his family practice physician for quite a while with fluoxetine. He was also given Wellbutrin at one point. He stated that he had stopped taking the fluoxetine after 3 months, and never took the Wellbutrin. He stated he had not been seen by a psychiatrist until he was evaluated at the behavioral health urgent care center. He admitted to marijuana and delta 8 usage. He also stated that he had previously drank alcohol significantly, but had not had any alcohol since 2020. When asked about what brought him to the behavioral health urgent care center on that particular date versus something from several months ago he stated that he had questioned his grandmother, and was uncomfortable with what ever answer she was giving, or which she would not answer questions. He is clearly  paranoid. He stated that his family has psychiatric illness, but nothing formally diagnosed. Although he stated he had not seen a psychiatrist before, the comprehensive clinical assessment note stated that he had been seeing a therapist and a psychiatrist, but it stopped seeing them when the pandemic started. It also stated that his struggle with depression and anxiety began approximately 6 years ago. He was seen by the nurse practitioner at the [facility] on 10/25/2020. The patient had agreed to take Zyprexa and fluoxetine. He stated at that time he felt as though he was doing better. The patient did state that he would continue the medications, but did not feel safe to discharge home. The patient's mother stated that she had no concern about the patient returning to her home, she did state she was concerned about his compliance given his noncompliance in the past. The decision was made to admit him to the hospital for evaluation and stabilization. The patient denied any suicidal or homicidal ideation. He is still significantly paranoid, glancing around the room, and did admit to auditory hallucinations in the past.  Principal Problem: Schizophrenia spectrum disorder with psychotic disorder type not yet determined [facility]) Discharge Diagnoses: Principal Problem:   Schizophrenia spectrum disorder with psychotic disorder type not yet determined ([place]) Active Problems:   Marijuana abuse   Temporal lobe lesion   Past Psychiatric History: See H&P  Past Medical History:  Past Medical History:  Diagnosis Date  . Chest tightness 04/18/2017  . Dysthymia 04/19/2017   Referral downstairs to behavioral health for counseling, patient is advised on mental health safety  precautions, can come to me if he would like to discuss meds  . Eustachian tube dysfunction, bilateral 04/19/2017   Canals clear and tympanic membranes normal, advised OTC Flonase and or second generation  antihistamine Claritin/Zyrtec/Allegra or similar generic  . Sinusitis 05/18/2016  . Vision changes 05/18/2016   History reviewed. No pertinent surgical history. Family History: History reviewed. No pertinent family history. Family Psychiatric  History: See H&P Social History:  Social History   Substance and Sexual Activity  Alcohol Use No     Social History   Substance and Sexual Activity  Drug Use Yes  . Types: Marijuana   Comment: Delta 8    Social History   Socioeconomic History  . Marital status: Married    Spouse name: Not on file  . Number of children: Not on file  . Years of education: Not on file  . Highest education level: Not on file  Occupational History  . Not on file  Tobacco Use  . Smoking status: Never Smoker  . Smokeless tobacco: Never Used  Vaping Use  . Vaping Use: Every day  . Substances: THC  Substance and Sexual Activity  . Alcohol use: No  . Drug use: Yes    Types: Marijuana    Comment: Delta 8  . Sexual activity: Not Currently  Other Topics Concern  . Not on file  Social History Narrative  . Not on file   Social Determinants of Health   Financial Resource Strain: Not on file  Food Insecurity: Not on file  Transportation Needs: Not on file  Physical Activity: Not on file  Stress: Not on file  Social Connections: Not on file   Hospital Course:  Patient was admitted to [facility] for new onset psychosis and bizarre behaviors. Patient has a history of MDD without psychotic features and had been using Delta 8 and THC prior to admission. During admission he had periods of agitation, disorganized thinking, and aggression with associated AVH and paranoid delusions. He required 1:1 sitter at points during his admission. He was initially tried on Risperdal and then Zyprexa with limited benefit in managing his behaviors and psychosis. He was later transitioned to combination of Haldol and Tegretol which he responded well to in terms of  behavioral management, improved sleep, and improvement in his psychosis. Gradually, his psychosis started to clear and his Haldol was reduced to $RemoveBe'5mg'ZRUEaLAhs$  tid along with Tegretol $RemoveBefor'200mg'WkuiRSadkoci$  bid. As part of a new onset psychosis workup, he had lab testing including WBC on admission 6.4 and on repeat 4.5, H/H 16.1/42.7, platelets 225, ESR 0, TSH 1.422, UDS positive for marijuana, CRP <0.5,  B12 335, and HIV nonreactive. His Tegretol level was 5.7 and his ANA, ceruloplasmin, heavy metal screen, and RPR are still pending. He had a head CT as part of a new onset psychosis work-up which showed a lesion in his right temporal lobe. Dr Nelda Marseille consulted with neurology and it was suggested that given the size and location of the lesion that he be admitted to the neurology service for additional work-up including continuous EEG monitoring and MRI w/wo contrast. It is unclear if the temporal lobe lesion could be contributing to his new onset psychosis and behavioral changes, or if he instead has a primary psychotic d/o versus a substance induced psychosis that is clearing as he metabolizes the synthetic THC in his system. Patient will be discharged form [facility] and admitted to [facility] Neurology service for continued workup at this time. Psychiatry CL to  follow.   Physical Findings: AIMS: Facial and Oral Movements Muscles of Facial Expression: None, normal Lips and Perioral Area: None, normal Jaw: None, normal Tongue: None, normal,Extremity Movements Upper (arms, wrists, hands, fingers): None, normal Lower (legs, knees, ankles, toes): None, normal, Trunk Movements Neck, shoulders, hips: None, normal, Overall Severity Severity of abnormal movements (highest score from questions above): None, normal Incapacitation due to abnormal movements: None, normal Patient's awareness of abnormal movements (rate only patient's report): No Awareness, Dental Status Current problems with teeth and/or dentures?: No Does patient usually wear  dentures?: No       Musculoskeletal: Strength & Muscle Tone: within normal limits Gait & Station: normal Patient leans: N/A  Psychiatric Specialty Exam:   Blood pressure 127/79, pulse 78, temperature 98.3 F (36.8 C), temperature source Oral, resp. rate 16, height 5\' 7"  (1.702 m), weight 56.2 kg, SpO2 98 %.Body mass index is 19.42 kg/m.   General Appearance:improved hygiene, casually dressed  Eye Contact:Improved  Speech: Clear and Coherent and Normal Rate  Volume: Normal  Mood:Described as Mildly anxious  Affect: Constricted  Thought Process: Goal Directed, more linear  Orientation: Oriented to self, year, month, and city  Thought Content:Endorses some general paranoia and is mildly guarded on exam; denies ideas of reference but has belief in some residual thought insertion/withdrawal; denies thought broadcasting or AVH; no delusions noted  Suicidal Thoughts: Denied  Homicidal Thoughts: Denied  Memory: Immediate; Fair  Judgement: Improving  Insight: Improving  Psychomotor Activity: Normal, no tremor or cogwheeling; AIMS 0  Concentration: Concentration: Fairand Attention Span: Fair  Recall: Fair  Fund of Knowledge: Fair  Language: Good  Akathisia: No  Assets: Communication Skills Desire for Improvement Resilience Social supports  ADL's:Independent  Cognition: WNL  Sleep:Number of Hours: 10.25   Have you used any form of tobacco in the last 30 days? (Cigarettes, Smokeless Tobacco, Cigars, and/or Pipes): No  Has this patient used any form of tobacco in the last 30 days? (Cigarettes, Smokeless Tobacco, Cigars, and/or Pipes) Yes, N/A  Blood Alcohol level:  No results found for: [employer]  Metabolic Disorder Labs:  Lab Results  Component Value Date   HGBA1C 4.3 (L) 10/24/2020   MPG 76.71 10/24/2020   No results found for: PROLACTIN Lab Results  Component Value Date   CHOL 214 (H) 10/24/2020   TRIG 57 10/24/2020   HDL 57 10/24/2020    CHOLHDL 3.8 10/24/2020   VLDL 11 10/24/2020   LDLCALC 146 (H) 10/24/2020    See Psychiatric Specialty Exam and Suicide Risk Assessment completed by Attending Physician prior to discharge.  Discharge destination:  Other:  [facility] Neurology service  Is patient on multiple antipsychotic therapies at discharge:  No   Has Patient had three or more failed trials of antipsychotic monotherapy by history:  No  Recommended Plan for Multiple Antipsychotic Therapies: NA    Allergies as of 11/03/2020   No Known Allergies     Medication List    TAKE these medications   benztropine 0.5 MG tablet Commonly known as: COGENTIN Take 1 tablet (0.5 mg total) by mouth 2 (two) times daily.   carbamazepine 100 MG chewable tablet Commonly known as: TEGRETOL Chew 2 tablets (200 mg total) by mouth 2 (two) times daily.   feeding supplement Liqd Take 237 mLs by mouth 2 (two) times daily between meals. Start taking on: November 04, 2020   haloperidol 5 MG tablet Commonly known as: HALDOL Take 1 tablet (5 mg total) by mouth 3 (three) times  daily.   haloperidol 5 MG tablet Commonly known as: HALDOL Take 1 tablet (5 mg total) by mouth every 6 (six) hours as needed for agitation.   haloperidol lactate 5 MG/ML injection Commonly known as: HALDOL Inject 1 mL (5 mg total) into the muscle every 6 (six) hours as needed.   hydrOXYzine 25 MG tablet Commonly known as: ATARAX/VISTARIL Take 1 tablet (25 mg total) by mouth 3 (three) times daily as needed for anxiety.   LORazepam 2 MG/ML injection Commonly known as: ATIVAN Inject 1 mL (2 mg total) into the muscle every 6 (six) hours as needed (severe agitation).   LORazepam 2 MG tablet Commonly known as: ATIVAN Take 1 tablet (2 mg total) by mouth every 6 (six) hours as needed (severe agitation).   metoprolol tartrate 25 MG tablet Commonly known as: LOPRESSOR Take 1 tablet (25 mg total) by mouth 2 (two) times daily.   traZODone 50 MG  tablet Commonly known as: DESYREL Take 1 tablet (50 mg total) by mouth at bedtime as needed for sleep.       Follow-up recommendations:  Activity:  as tolerated Diet:  Heart Healthy  Comments:  Patient is discharged from [facility] and will be admitted to [facility] Neurology service  Signed: Harlow Asa, MD 11/03/2020, 6:23 PM

## 2020-11-03 NOTE — Progress Notes (Signed)
   11/03/20 0500  Sleep  Number of Hours 10.25

## 2020-11-03 NOTE — BHH Suicide Risk Assessment (Addendum)
[facility] Discharge Suicide Risk Assessment   Principal Problem: Schizophrenia spectrum disorder with psychotic disorder type not yet determined [facility]) Discharge Diagnoses: Principal Problem:   Schizophrenia spectrum disorder with psychotic disorder type not yet determined ([facility]) Active Problems:   Marijuana abuse   Temporal lobe lesion  Total Time Spent in Direct Patient Care:  I personally spent 30 minutes on the unit in direct patient care. The direct patient care time included face-to-face time with the patient, reviewing the patient's chart, communicating with other professionals, and coordinating care. Greater than 50% of this time was spent in counseling or coordinating care with the patient regarding goals of hospitalization, psycho-education, and discharge planning needs.  Musculoskeletal: Strength & Muscle Tone: within normal limits Gait & Station: normal , steady Patient leans: N/A  Psychiatric Specialty Exam:   Blood pressure 127/79, pulse 78, temperature 98.3 F (36.8 C), temperature source Oral, resp. rate 16, height 5\' 7"  (1.702 m), weight 56.2 kg, SpO2 98 %.Body mass index is 19.42 kg/m.   General Appearance: improved hygiene, casually dressed  Eye Contact:  Improved  Speech:  Clear and Coherent and Normal Rate  Volume:  Normal  Mood:  Described as Mildly anxious  Affect:  Constricted  Thought Process:  Goal Directed, more linear  Orientation:  Oriented to self, year, month, and city  Thought Content:  Endorses some general paranoia and is mildly guarded on exam; denies ideas of reference but has belief in some residual thought insertion/withdrawal; denies thought broadcasting or AVH; no delusions noted  Suicidal Thoughts:  Denied  Homicidal Thoughts:  Denied  Memory:  Immediate;   Fair  Judgement:  Improving  Insight:  Improving  Psychomotor Activity:  Normal, no tremor or cogwheeling; AIMS 0  Concentration:  Concentration: Fair and Attention Span: Fair  Recall:  of Knowledge:  Fair  Language:  Good  Akathisia:  No  Assets:  Communication Skills Desire for Improvement Resilience Social supports  ADL's:  Independent  Cognition:  WNL  Sleep:  Number of Hours: 10.25   Mental Status Per Nursing Assessment::   On Admission:  Suicidal ideation on admission; paranoid delusions on admission  Demographic Factors:  Male, Adolescent or young adult and Caucasian  Loss Factors: Death of grandparent  Historical Factors: Prior h/o abuse, unemployed, substance abuse prior to admission   Risk Reduction Factors:   Sense of responsibility to family, Living with another person, especially a relative and Positive social support  Continued Clinical Symptoms:  Medical Diagnoses and Treatments/Surgeries Residual paranoia with intermittent belief in thought withdrawal/insertion - improving  Cognitive Features That Contribute To Risk:  Episodic loss of executive function during periods of psychosis  Suicide Risk:  Mild:  Suicidal ideation of limited frequency, intensity, duration, and specificity.  There are no identifiable plans, no associated intent, mild dysphoria and related symptoms, good self-control (both objective and subjective assessment), few other risk factors, and identifiable protective factors, including available and accessible social support.  Plan Of Care/Follow-up recommendations:  Patient being transferred to hospitalist service at [facility] for Neurology consultation/work-up of right temporal lobe lesion. If patient attempts to leave, would recommend IVC. If he develops suicidal ideation or behavioral issues would place with 1:1 sitter. Psychiatry CL service to follow.   [facility], MD, FAPA 11/03/2020, 5:37 PM

## 2020-11-03 NOTE — Progress Notes (Signed)
Received call from Dr. Mason Jim regarding transferring this patient from [facility] to [facility].   25 y/o male with history of depression, admitted on 3/9 to [facility] with paranoid delusions and hallucinations. Etiology of behavioral issues not clear since he was recently smoking synthetic marijuana vs. primary psych issue. Over last several days, mental status improving. He is more clear, linear. He is there voluntarily. 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 Dr. Barrie Lyme reviewed/discussed CT with Dr. Selina Cooley on call for neurology who recommended transfer to [facility] for MRI and continuous EEG monitoring to see if this cystic lesion could be contributing to behavioral changes. Dr. Mason Jim will ask psych consult service to follow while patient is at [facility]. She recommends to continue his current psych med regimen of haldol 5mg  po tid, cogentin 0.5mg  bid and tegretol 200mg  bid. He is also on prn ativan, but has not required any. She did not feel that he needed a 1:1 sitter at this point.  Our team will evaluate patient upon transfer to [facility] and consult with neurology.  

## 2020-11-03 NOTE — Progress Notes (Signed)
   11/03/20 1720  Vital Signs  Temp 98.3 F (36.8 C)  Temp Source Oral  Pulse Rate 78  Pulse Rate Source Dinamap  Resp 16  BP 127/79  BP Location Left Arm  BP Method Automatic  Patient Position (if appropriate) Sitting  Oxygen Therapy  SpO2 98 %  O2 Device Room Air   D: Patient denied SI/HI/AVH. Patient rated anxiety 3/10 and denies depression. Pt. Was out in open areas, but spent most of his time in his room. Pt. Was taken off 1:1 and was put on close observation. Later in the shift Dr. Had consulted neurology about a cyst and it was determined that the pt. Needed to be transported to [facility] for further examination. Dr. Effie Berkshire this to the patient and the patient was agreeable.   A:  Patient took scheduled medicine.  Support and encouragement provided Routine safety checks conducted every 15 minutes. Patient  Informed to notify staff with any concerns.   R: Patient is still on close observation until patient can be transferred to [facility] MHT present.  Safety maintained.

## 2020-11-03 NOTE — Progress Notes (Signed)
Nursing 1:1 note D:Pt observed sleeping in bed with eyes closed. RR even and unlabored. No distress noted. Pt stated he was feeling ok earlier in the evening  A: 1:1 observation continues for safety  R: pt remains safe

## 2020-11-03 NOTE — Progress Notes (Signed)
Nursing 1:1 note D:Pt observed sleeping in bed with eyes closed. RR even and unlabored. No distress noted. A: 1:1 observation continues for safety  R: pt remains safe  

## 2020-11-03 NOTE — Progress Notes (Signed)
1:1  Pt.in room sleeping. MHT  Monitoring. Safety maintained, continue 1:1.

## 2020-11-03 NOTE — Plan of Care (Signed)
I contacted Dr. Selina Cooley, on call for neurology, and discussed the patient's Head CT findings. In the context of his new onset psychosis and recent bizarre behaviors as well as his 3X4cm lesion in his right temporal lobe, she recommends that the patient be transferred to hospitalist service for MRI and continuous EEG monitoring.   I contacted hospitalist service and spoke with Dr. Kerry Hough. The case was discussed. Patient will be transferred to medicine at this time at [facility]. I notified psych CL service of his pending transfer so they can continue to follow the patient while on medicine service.  I made patient aware of this plan and he agrees to transfer. He requested that I contact his mother. I spoke to his mother, Vinie Sill, and updated her on this plan and CT findings.  [name], MD, Celene Skeen

## 2020-11-03 NOTE — Progress Notes (Signed)
   11/03/20 2300  Psych Admission Type (Psych Patients Only)  Admission Status Voluntary  Psychosocial Assessment  Patient Complaints Anxiety  Eye Contact Fair  Facial Expression Anxious  Affect Appropriate to circumstance  Speech Logical/coherent  Interaction Minimal;Cautious  Motor Activity Slow  Appearance/Hygiene Disheveled  Behavior Characteristics Cooperative  Mood Anxious;Depressed  Aggressive Behavior  Effect No apparent injury  Thought Process  Coherency WDL  Content WDL  Delusions Paranoid;Persecutory  Perception Hallucinations  Hallucination Visual ("I saw a TV in my room this morning when I woke up")  Judgment Impaired  Confusion Moderate  Danger to Self  Current suicidal ideation? Denies  Self-Injurious Behavior No self-injurious ideation or behavior indicators observed or expressed   Agreement Not to Harm Self Yes  Description of Agreement verbally contract for safety  Danger to Others  Danger to Others None reported or observed

## 2020-11-03 NOTE — BHH Counselor (Signed)
CSW spoke with this patients mother who reported that she is concerned with this patient returning to stay with his grandmother due to feeling that she enables him. She states this patient could come to stay with her, however she has two teenage girls she is caring for in the house.  Pt's mother is interested in this patient receiving longer term residential treatment before returning home if he is agreeable to this.    Ruthann Cancer MSW, LCSW Clincal Social Worker  [facility]

## 2020-11-03 NOTE — BHH Group Notes (Signed)
BHH LCSW Group Therapy  11/03/2020 2:01 PM  Type of Therapy:  Movement Therapy  Participation Level:  Did Not Attend   Summary of Progress/Problems: Patient declined to attend.  Cody Roberts 11/03/2020, 2:01 PM

## 2020-11-03 NOTE — Progress Notes (Signed)
[facility] MD Progress Note  11/03/2020 8:12 AM Cody Roberts  MRN:  [id number]   Chief Complaint: psychosis  Subjective:  Cody Roberts is a 25 y.o. male with a history of major depression, who was initially admitted for inpatient psychiatric hospitalization on 10/27/2020 for management of paranoid delusions. Prior to admission he reportedly had been using THC and Delta 8. During admission, he has had periods of agitation, aggression, and disorganized thinking that is starting to clear. The patient is currently on Hospital Day 7.   Chart Review from last 24 hours:  The patient's chart was reviewed and nursing notes were reviewed. The patient's case was discussed in multidisciplinary team meeting. Per nursing notes, he was sleeping most of yesterday on day shift and had no behavioral outbursts or agitation noted. He slept well overnight. He remained on 1:1. He did not attend groups. Per [facility] he received 2 doses of Tegretol rather than 3 due to oversedation and received 1 dose of Haldol rather than scheduled doses due to oversedation. He received no PRN Medications.   Information Obtained Today During Patient Interview: The patient was seen and evaluated in his room. Prior to interview, he was in the dayroom playing cards with a peer. He states he is feeling better today and slept well overnight. He voices no physical complaints. He states he is mildly anxious today but is no longer having AH. He denies TH, VH, ideas of reference, or thought broadcasting. He reports that his concern about possible thought insertion/withdrawal has lessened and he reports feeling that he can discern what is real and what is unreal better today. He states his thoughts feel more clear. He continues to have some general sense of paranoia but states it is not related to any specific staff member or peer. He denies SI or HI. He has some dry mouth and constipation but denies other medication side-effects. He reports improved appetite.  When questioned about the finding on his CT scan, he states he was diagnosed at [facility] with an arachnoid cyst in 7th grade and was told if it does not enlarge or give him symptoms he needed no further w/u. He denies HA, current vision changes, dizziness, speech difficulties, balance issues, syncope, or focal neurologic issues. He does not have a current neurologist.   Principal Problem: Schizophrenia spectrum disorder with psychotic disorder type not yet determined ([place]) Diagnosis: Principal Problem:   Schizophrenia spectrum disorder with psychotic disorder type not yet determined ([place]) Active Problems:   Marijuana abuse  Total Time Spent in Direct Patient Care:  I personally spent 35 minutes on the unit in direct patient care. The direct patient care time included face-to-face time with the patient, reviewing the patient's chart, communicating with other professionals, and coordinating care. Greater than 50% of this time was spent in counseling or coordinating care with the patient regarding goals of hospitalization, psycho-education, and discharge planning needs.  Past Psychiatric History: see admission H&P  Past Medical History:  Past Medical History:  Diagnosis Date  . Chest tightness 04/18/2017  . Dysthymia 04/19/2017   Referral downstairs to behavioral health for counseling, patient is advised on mental health safety precautions, can come to me if he would like to discuss meds  . Eustachian tube dysfunction, bilateral 04/19/2017   Canals clear and tympanic membranes normal, advised OTC Flonase and or second generation antihistamine Claritin/Zyrtec/Allegra or similar generic  . Sinusitis 05/18/2016  . Vision changes 05/18/2016   Family History: see admission H&P  Family Psychiatric  History: see admission H&P  Social History:  Social History   Substance and Sexual Activity  Alcohol Use No     Social History   Substance and Sexual Activity  Drug Use Yes  . Types: Marijuana    Comment: Delta 8    Social History   Socioeconomic History  . Marital status: Married    Spouse name: Not on file  . Number of children: Not on file  . Years of education: Not on file  . Highest education level: Not on file  Occupational History  . Not on file  Tobacco Use  . Smoking status: Never Smoker  . Smokeless tobacco: Never Used  Vaping Use  . Vaping Use: Every day  . Substances: THC  Substance and Sexual Activity  . Alcohol use: No  . Drug use: Yes    Types: Marijuana    Comment: Delta 8  . Sexual activity: Not Currently  Other Topics Concern  . Not on file  Social History Narrative  . Not on file   Social Determinants of Health   Financial Resource Strain: Not on file  Food Insecurity: Not on file  Transportation Needs: Not on file  Physical Activity: Not on file  Stress: Not on file  Social Connections: Not on file   Sleep: Good  Appetite:  Fair  Current Medications: Current Facility-Administered Medications  Medication Dose Route Frequency Provider Last Rate Last Admin  . acetaminophen (TYLENOL) tablet 650 mg  650 mg Oral Q6H PRN Prescilla Sours, PA-C      . alum & mag hydroxide-simeth (MAALOX/MYLANTA) 200-200-20 MG/5ML suspension 30 mL  30 mL Oral Q4H PRN Margorie John W, PA-C      . benztropine (COGENTIN) tablet 1 mg  1 mg Oral BID Viann Fish E, MD   1 mg at 11/02/20 1032  . carbamazepine (TEGRETOL) chewable tablet 200 mg  200 mg Oral TID Sharma Covert, MD   200 mg at 11/02/20 1333  . docusate sodium (COLACE) capsule 100 mg  100 mg Oral Daily Nelda Marseille, Lilla Callejo E, MD   100 mg at 11/02/20 1029  . feeding supplement (ENSURE ENLIVE / ENSURE PLUS) liquid 237 mL  237 mL Oral BID BM Laquincy, Eastridge W, PA-C   237 mL at 11/02/20 1441  . haloperidol (HALDOL) tablet 10 mg  10 mg Oral Q6H PRN Sharma Covert, MD       Or  . haloperidol lactate (HALDOL) injection 10 mg  10 mg Intramuscular Q6H PRN Sharma Covert, MD      . haloperidol (HALDOL) tablet 10  mg  10 mg Oral TID Harlow Asa, MD   10 mg at 11/02/20 1333   Or  . haloperidol lactate (HALDOL) injection 10 mg  10 mg Intramuscular TID Harlow Asa, MD      . hydrOXYzine (ATARAX/VISTARIL) tablet 25 mg  25 mg Oral TID PRN Prescilla Sours, PA-C   25 mg at 11/01/20 0903  . LORazepam (ATIVAN) injection 2 mg  2 mg Intramuscular Q6H PRN Nelda Marseille, Babbie Dondlinger E, MD      . LORazepam (ATIVAN) tablet 2 mg  2 mg Oral Q6H PRN Nelda Marseille, Yohanna Tow E, MD      . magnesium hydroxide (MILK OF MAGNESIA) suspension 30 mL  30 mL Oral Daily PRN Margorie John W, PA-C      . metoprolol tartrate (LOPRESSOR) tablet 25 mg  25 mg Oral BID Sharma Covert, MD   25 mg at 11/02/20 1730  .  traZODone (DESYREL) tablet 50 mg  50 mg Oral QHS PRN Kyrollos, Cordell, PA-C   50 mg at 10/31/20 2246    Lab Results:  Results for orders placed or performed during the hospital encounter of 10/27/20 (from the past 48 hour(s))  POC SARS Coronavirus 2 Ag     Status: None   Collection Time: 11/01/20  6:38 PM  Result Value Ref Range   SARS Coronavirus 2 Ag NEGATIVE NEGATIVE    Comment: (NOTE) SARS-CoV-2 antigen NOT DETECTED.   Negative results are presumptive.  Negative results do not preclude SARS-CoV-2 infection and should not be used as the sole basis for treatment or other patient management decisions, including infection  control decisions, particularly in the presence of clinical signs and  symptoms consistent with COVID-19, or in those who have been in contact with the virus.  Negative results must be combined with clinical observations, patient history, and epidemiological information. The expected result is Negative.  Fact Sheet for Patients: HandmadeRecipes.com.cy  Fact Sheet for Healthcare Providers: FuneralLife.at  This test is not yet approved or cleared by the [place] FDA and  has been authorized for detection and/or diagnosis of SARS-CoV-2 by FDA under an Emergency  Use Authorization (EUA).  This EUA will remain in effect (meaning this test can be used) for the duration of  the COV ID-19 declaration under Section 564(b)(1) of the Act, 21 U.S.C. section 360bbb-3(b)(1), unless the authorization is terminated or revoked sooner.    C-reactive protein     Status: None   Collection Time: 11/03/20  6:34 AM  Result Value Ref Range   CRP <0.5 <1.0 mg/dL    Comment: Performed at [facility], [place] [address], [place], [place] [address]  Comprehensive metabolic panel     Status: Abnormal   Collection Time: 11/03/20  6:34 AM  Result Value Ref Range   Sodium 138 135 - 145 mmol/L   Potassium 3.8 3.5 - 5.1 mmol/L   Chloride 100 98 - 111 mmol/L   CO2 27 22 - 32 mmol/L   Glucose, Bld 138 (H) 70 - 99 mg/dL    Comment: Glucose reference range applies only to samples taken after fasting for at least 8 hours.   BUN 16 6 - 20 mg/dL   Creatinine, Ser 1.24 0.61 - 1.24 mg/dL   Calcium 9.4 8.9 - 10.3 mg/dL   Total Protein 7.1 6.5 - 8.1 g/dL   Albumin 4.7 3.5 - 5.0 g/dL   AST 31 15 - 41 U/L   ALT 31 0 - 44 U/L   Alkaline Phosphatase 46 38 - 126 U/L   Total Bilirubin 1.3 (H) 0.3 - 1.2 mg/dL   GFR, Estimated >60 >60 mL/min    Comment: (NOTE) Calculated using the CKD-EPI Creatinine Equation (2021)    Anion gap 11 5 - 15    Comment: Performed at [facility], [place] [address] [place], [place] [address]  Vitamin B12     Status: None   Collection Time: 11/03/20  6:34 AM  Result Value Ref Range   Vitamin B-12 335 180 - 914 pg/mL    Comment: (NOTE) This assay is not validated for testing neonatal or myeloproliferative syndrome specimens for Vitamin B12 levels. Performed at [facility], [place] [address], [place], [place] [address]   CBC with Differential/Platelet     Status: None   Collection Time: 11/03/20  6:34 AM  Result Value Ref Range   WBC 4.5 4.0 -  10.5 K/uL   RBC 5.24 4.22 - 5.81 MIL/uL   Hemoglobin  15.5 13.0 - 17.0 g/dL   HCT 45.7 39.0 - 52.0 %   MCV 87.2 80.0 - 100.0 fL   MCH 29.6 26.0 - 34.0 pg   MCHC 33.9 30.0 - 36.0 g/dL   RDW 12.6 11.5 - 15.5 %   Platelets 184 150 - 400 K/uL   nRBC 0.0 0.0 - 0.2 %   Neutrophils Relative % 61 %   Neutro Abs 2.7 1.7 - 7.7 K/uL   Lymphocytes Relative 31 %   Lymphs Abs 1.4 0.7 - 4.0 K/uL   Monocytes Relative 7 %   Monocytes Absolute 0.3 0.1 - 1.0 K/uL   Eosinophils Relative 1 %   Eosinophils Absolute 0.0 0.0 - 0.5 K/uL   Basophils Relative 0 %   Basophils Absolute 0.0 0.0 - 0.1 K/uL   Immature Granulocytes 0 %   Abs Immature Granulocytes 0.01 0.00 - 0.07 K/uL    Comment: Performed at [facility], [place] [address], [place], [place] [address]    Blood Alcohol level:  No results found for: [facility]  Metabolic Disorder Labs: Lab Results  Component Value Date   HGBA1C 4.3 (L) 10/24/2020   MPG 76.71 10/24/2020   No results found for: PROLACTIN Lab Results  Component Value Date   CHOL 214 (H) 10/24/2020   TRIG 57 10/24/2020   HDL 57 10/24/2020   CHOLHDL 3.8 10/24/2020   VLDL 11 10/24/2020   LDLCALC 146 (H) 10/24/2020    Physical Findings: AIMS: Facial and Oral Movements Muscles of Facial Expression: None, normal Lips and Perioral Area: None, normal Jaw: None, normal Tongue: None, normal,Extremity Movements Upper (arms, wrists, hands, fingers): None, normal Lower (legs, knees, ankles, toes): None, normal, Trunk Movements Neck, shoulders, hips: None, normal, Overall Severity Severity of abnormal movements (highest score from questions above): None, normal Incapacitation due to abnormal movements: None, normal Patient's awareness of abnormal movements (rate only patient's report): No Awareness, Dental Status Current problems with teeth and/or dentures?: No Does patient usually wear dentures?: No      Musculoskeletal: Strength & Muscle Tone: within normal limits Gait & Station: steady, normal Patient leans:  N/A  Psychiatric Specialty Exam: Physical Exam Vitals reviewed.  HENT:     Head: Normocephalic.  Pulmonary:     Effort: Pulmonary effort is normal.  Neurological:     Mental Status: He is alert.        Blood pressure 113/74, pulse 70, temperature 98 F (36.7 C), temperature source Oral, resp. rate 18, height $RemoveBe'5\' 7"'rSzLoiFPi$  (1.702 m), weight 56.2 kg, SpO2 97 %.Body mass index is 19.42 kg/m.  General Appearance: improved hygiene, casually dressed  Eye Contact:  Improved  Speech:  Clear and Coherent and Normal Rate  Volume:  Normal  Mood:  Described as Mildly anxious  Affect:  Constricted  Thought Process:  Goal Directed, more linear  Orientation:  Oriented to self, year, month, and city  Thought Content:  Endorses some general paranoia and is mildly guarded on exam; denies ideas of reference but has belief in some residual thought insertion/withdrawal; denies thought broadcasting or AVH; no delusions noted  Suicidal Thoughts:  Denied  Homicidal Thoughts:  Denied  Memory:  Immediate;   Fair  Judgement:  Improving  Insight:  Improving  Psychomotor Activity:  Normal, no tremor or cogwheeling; AIMS 0  Concentration:  Concentration: Fair and Attention Span: Fair  Recall:  [employer] of Knowledge:  Fair  Language:  Good  Akathisia:  No  Assets:  Communication Skills Desire for Improvement Resilience  ADL's:  Independent  Cognition:  WNL  Sleep:  Number of Hours: 10.25   Treatment Plan Summary: Diagnoses / Active Problems: Unspecified schizophrenia spectrum and other psychotic d/o (r/o schizophreniform d/o, r/o MDD with psychotic features, r/o substance induced psychotic d/o, r/o psychotic d/o secondary to a general medical condition) Cannabis use d/o  PLAN: 1. Safety and Monitoring:  -- Voluntary admission to inpatient psychiatric unit for safety, stabilization and treatment  -- Daily contact with patient to assess and evaluate symptoms and progress in treatment  -- Patient's  case to be discussed in multi-disciplinary team meeting  -- Observation Level : 1:1  -- Vital signs:  q12 hours  -- Precautions: suicide  2. 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 (r/o schizophreniform d/o, r/o MDD with psychotic features, r/o substance induced psychotic d/o, r/o psychotic d/o secondary to a general medical condition) -- Reduce Haldol to 5mg  tid po for psychosis due to oversedation and improvement in his agitation/psychosis -- Reduce Cogentin to 0.5mg  bid prophylactically in the context of high dose antipsychotic use with Colace 100mg  daily to avoid constipation with anticholinergic med -- Reduce Tegretol to 200mg  bid given oversedation  -- Continue Ativan 2mg  po or IM q6 hours PRN severe agitation -- Continue Trazodone 50mg  po qhs PRN insomnia -- Continue Vistaril 25mg  q 6 hours PRN anxiety -- Reduce Haldol 5mg  IM or po q6 hours PRN severe agitation/aggression  -- Metabolic profile and EKG monitoring obtained while on an atypical antipsychotic (BMI:19.42; Lipid Panel:cholesterol 214, triglycerides 57, HDL 57, LDL 146;  HbgA1c:4.3 repeat QTc:403)   -- In context of new onset psychotic symptoms, will check additional labs to r/o an organic etiology to his presentation (ANA, HIV, ceruloplasmin, RPR, and heavy metal screen pending)  (WBC on admission 6.4 and on repeat 4.5, H/H 16.1/42.7, platelets 225; ESR 0, TSH 1.422; UDS positive for marijuana; CRP <0.5; B12 335; Noncontrast Head CT shows no acute intracranial abnormality in a patient with a known right temporal lobe CSF density lesion likely representing an arachnoid cyst)   -- Short Term Goals: Ability to demonstrate self-control will improve  -- Long Term Goals: Improvement in symptoms so as ready for discharge   Cannabis Use d/o  -- Discussed need to abstain from illicit and synthetic substance after discharge  -- Short Term Goals: Ability to identify triggers  associated with substance abuse/mental health issues will improve  -- Long Term Goals: Improvement in symptoms so as ready for discharge    High Risk Medication Use: -- The complexity of this patient's case involves drug therapy with Tegretol which requires intensive monitoring for toxicity. 11/13/20: WBC 4.5, H/H 15.5/45.7 and platelets 184; Tegretol level 5.7; AST 31 and ALT 31 with alk phos 46 and total bili 1.3   3. 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 Discharge Planning:   -- Social work and case management to assist with discharge planning and identification of hospital follow-up needs  prior to discharge  -- Estimated LOS: 4-5 days  -- Discharge Concerns: Need to establish a safety plan; Medication compliance and effectiveness  -- Discharge Goals: Return home with outpatient referrals for mental health follow-up including medication management/psychotherapy  Harlow Asa, MD, FAPA 11/03/2020, 8:12 AM

## 2020-11-04 ENCOUNTER — Encounter (HOSPITAL_COMMUNITY): Payer: Self-pay | Admitting: Internal Medicine

## 2020-11-04 ENCOUNTER — Observation Stay (HOSPITAL_COMMUNITY)
Admission: AD | Admit: 2020-11-04 | Discharge: 2020-11-05 | Disposition: A | Discharge status: Home / Self Care | Payer: BC Managed Care – PPO | Source: Ambulatory Visit | Attending: Internal Medicine | Admitting: Internal Medicine

## 2020-11-04 ENCOUNTER — Observation Stay (HOSPITAL_COMMUNITY): Payer: BC Managed Care – PPO

## 2020-11-04 DIAGNOSIS — G4700 Insomnia, unspecified: Secondary | ICD-10-CM | POA: Insufficient documentation

## 2020-11-04 DIAGNOSIS — G930 Cerebral cysts: Secondary | ICD-10-CM | POA: Diagnosis not present

## 2020-11-04 DIAGNOSIS — G939 Disorder of brain, unspecified: Secondary | ICD-10-CM | POA: Diagnosis not present

## 2020-11-04 DIAGNOSIS — F1210 Cannabis abuse, uncomplicated: Secondary | ICD-10-CM | POA: Diagnosis present

## 2020-11-04 DIAGNOSIS — F29 Unspecified psychosis not due to a substance or known physiological condition: Secondary | ICD-10-CM | POA: Diagnosis not present

## 2020-11-04 DIAGNOSIS — F419 Anxiety disorder, unspecified: Secondary | ICD-10-CM

## 2020-11-04 LAB — ANA W/REFLEX IF POSITIVE: Anti Nuclear Antibody (ANA): NEGATIVE

## 2020-11-04 LAB — RPR: RPR Ser Ql: NONREACTIVE

## 2020-11-04 LAB — CERULOPLASMIN: Ceruloplasmin: 16.7 mg/dL (ref 16.0–31.0)

## 2020-11-04 MED ORDER — ACETAMINOPHEN 325 MG PO TABS: 650.0000 mg | ORAL_TABLET | Freq: Four times a day (QID) | ORAL | Status: DC | PRN

## 2020-11-04 MED ORDER — TRAZODONE HCL 50 MG PO TABS: 50.0000 mg | ORAL_TABLET | Freq: Every evening | ORAL | Status: DC | PRN

## 2020-11-04 MED ORDER — HALOPERIDOL 5 MG PO TABS
5.0000 mg | ORAL_TABLET | Freq: Three times a day (TID) | ORAL | Status: DC
  Administered 2020-11-04 – 2020-11-05 (×4): 5 mg via ORAL
  Filled 2020-11-04 (×4): qty 1

## 2020-11-04 MED ORDER — METOPROLOL TARTRATE 25 MG PO TABS
25.0000 mg | ORAL_TABLET | Freq: Two times a day (BID) | ORAL | Status: DC
  Administered 2020-11-04 (×2): 25 mg via ORAL
  Filled 2020-11-04 (×3): qty 1

## 2020-11-04 MED ORDER — ACETAMINOPHEN 650 MG RE SUPP: 650.0000 mg | Freq: Four times a day (QID) | RECTAL | Status: DC | PRN

## 2020-11-04 MED ORDER — ENOXAPARIN SODIUM 40 MG/0.4ML ~~LOC~~ SOLN
40.0000 mg | SUBCUTANEOUS | Status: DC
  Administered 2020-11-04 – 2020-11-05 (×2): 40 mg via SUBCUTANEOUS
  Filled 2020-11-04 (×2): qty 0.4

## 2020-11-04 MED ORDER — BENZTROPINE MESYLATE 0.5 MG PO TABS
0.5000 mg | ORAL_TABLET | Freq: Two times a day (BID) | ORAL | Status: DC
  Administered 2020-11-04 – 2020-11-05 (×3): 0.5 mg via ORAL
  Filled 2020-11-04 (×4): qty 1

## 2020-11-04 MED ORDER — GADOBUTROL 1 MMOL/ML IV SOLN
6.0000 mL | Freq: Once | INTRAVENOUS | Status: AC | PRN
  Administered 2020-11-04: 6 mL via INTRAVENOUS

## 2020-11-04 MED ORDER — HYDROXYZINE HCL 25 MG PO TABS: 25.0000 mg | ORAL_TABLET | Freq: Three times a day (TID) | ORAL | Status: DC | PRN

## 2020-11-04 MED ORDER — CARBAMAZEPINE 100 MG PO CHEW
200.0000 mg | CHEWABLE_TABLET | Freq: Two times a day (BID) | ORAL | Status: DC
Start: 2020-11-04 — End: 2020-11-05
  Administered 2020-11-04 – 2020-11-05 (×3): 200 mg via ORAL
  Filled 2020-11-04 (×4): qty 2

## 2020-11-04 NOTE — Plan of Care (Signed)

## 2020-11-04 NOTE — Progress Notes (Signed)
Pt admitted to the unit as a direct admit from Behavior health. Pt ambulatory in room and on unit. Pt A&O x4, telemetry applied and verified with CCMD, NT called to second verify, IV established. Pt oriented to the unit and room. No orders, MD paged and notified of pt arrival and needing orders. Pt bed alarm on and call  light within reach. No opened wounds or pressure ulcer noted. Will continue to closely monitor. Dionne Bucy RN   11/04/20 0307  Vitals  Temp 98.8 F (37.1 C)  Temp Source Oral  BP 104/71  MAP (mmHg) 83  BP Location Left Arm  BP Method Automatic  Patient Position (if appropriate) Sitting  Pulse Rate 64  Pulse Rate Source Monitor  Resp 16  MEWS COLOR  MEWS Score Color Green  Oxygen Therapy  SpO2 98 %  O2 Device Room Air  Pain Assessment  Pain Scale 0-10  Pain Score 0  MEWS Score  MEWS Temp 0  MEWS Systolic 0  MEWS Pulse 0  MEWS RR 0  MEWS LOC 0  MEWS Score 0

## 2020-11-04 NOTE — Progress Notes (Signed)
  [facility] Adult Case Management Discharge Plan :  Will you be returning to the same living situation after discharge:  No. Discharged to medical unit At discharge, do you have transportation home?: No. Safe Transport to be arranged Do you have the ability to pay for your medications: Yes,  has insurance  Release of information consent forms completed and in the chart;  Patient's signature needed at discharge.  Patient to Follow up at:  Follow-up Information    [facility] Go on 11/04/2020.   Why: You have an appointment on 11/04/20 at 9:00 am, in person for therapy services.  You also have an appointment for medication management on 11/23/20 at 1:30 pm, in person. Contact information: [address] [place] [address] [phone]               Next level of care provider has access to [facility] Link:no  Safety Planning and Suicide Prevention discussed: Yes,  with mother  Have you used any form of tobacco in the last 30 days? (Cigarettes, Smokeless Tobacco, Cigars, and/or Pipes): No  Has patient been referred to the Quitline?: N/A patient is not a smoker  Patient has been referred for addiction treatment: Pt. refused referral  Otelia Santee, LCSW 11/04/2020, 9:15 AM

## 2020-11-04 NOTE — Progress Notes (Signed)
LTM EEG hooked up and running - no initial skin breakdown - push button tested - neuro notified. [facility] monitoring.  

## 2020-11-04 NOTE — Consult Note (Signed)
  Patient seen and examined, admitted earlier this morning by Dr. Loney Loh, briefly Cody Roberts is a pleasant 25 year old male with past history of depression and anxiety presented to [facility] and was admitted 3/17, originally presented to [facility] on 3/6 with paranoid delusions and auditory hallucinations along with worsening depression, he also had a recent history of smoking synthetic marijuana, he was started on Haldol and Tegretol with good effect, his psychosis started to clear he also had a CT head which needed noted on right temple lesion suspicious for an arachnoid cyst, psych MD consulted neurology and there was concern whether this might be contributing to his new onset psychosis and behavioral problems, neurology recommended admission for further work-up including EEG, MRI and serologies.  Psych consult placed for transfer from behavioral [facility] for MRI and cEEG, neurological work-up, request assistance with continuing psychiatric work-up and management for agitation.  Patient is seen and assessed by this nurse practitioner, he is observed to be lying in bed.  Patient is able to sit up appropriately and acknowledge writer upon entering the room, and brightens upon approach.  Patient is currently undergoing a EEG during this evaluation.  However he currently denies any psychiatric symptoms to include psychosis, hallucinations, paranoia.  Patient does not appear to be exhibiting any delusional thought disorder, responding to internal stimuli, external stimuli, and or psychosis.  Patient is alert and oriented, calm and cooperative, and pleasant.  He does not appear to be exhibiting any disruptive behaviors, agitation, and or aggression during this evaluation.  Patient will remain under psychiatry services while continuing neurological work-up, no additional changes will be made at this time.  Last adjustments were made by Dr. Mason Jim as of yesterday to include reducing carbamazepine dose 200 mg p.o. twice  daily, Haldol 5 mg p.o. 3 times daily, benztropine 0.5 mg p.o. twice daily.  Patient also has as needed medication to include hydroxyzine 25 mg p.o. 3 times daily as needed for anxiety and trazodone 50 mg p.o. nightly as as needed for sleep, patient does not appear to have received the as needed medication since his admission to the medical unit.  -Psychiatry to continue to follow per request, no additional changes made at this time. -Continue current medications to further target psychosis, agitation, and auditory hallucinations. -Continue neurology recommendations at this time, EEG is pending.  MRI completed this morning results are now available.

## 2020-11-04 NOTE — Progress Notes (Signed)
EEG complete - results pending 

## 2020-11-04 NOTE — Procedures (Signed)
Patient Name: MACARIUS RUARK  MRN: [id number]  Epilepsy Attending: Charlsie Quest  Referring Physician/Provider: Dr. Erick Blinks Date: 11/04/2020 Duration: 31.55 mins  Patient history: 25 year old male who presented with delusions and was found to have a right temporal ecchymosis.  EEG to evaluate for seizures.  Level of alertness: Awake  AEDs during EEG study: Carbamazepine  Technical aspects: This EEG study was done with scalp electrodes positioned according to the 10-20 International system of electrode placement. Electrical activity was acquired at a sampling rate of 500Hz  and reviewed with a high frequency filter of 70Hz  and a low frequency filter of 1Hz . EEG data were recorded continuously and digitally stored.   Description: The posterior dominant rhythm consists of 8-9 Hz activity of moderate voltage (25-35 uV) seen predominantly in posterior head regions, symmetric and reactive to eye opening and eye closing.  EEG also showed intermittent generalized and lateralized right hemisphere 4 to 5 Hz theta slowing.  Hyperventilation and photic stimulation were not performed.     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  No seizures or epileptiform discharges were seen throughout the recording.  Jeorgia Helming 

## 2020-11-04 NOTE — Progress Notes (Signed)
Tried to call report to [phone] , waiting for call back to give report before transport

## 2020-11-04 NOTE — Progress Notes (Addendum)
Patient seen and examined, admitted earlier this morning by Dr. Loney Loh, briefly Cody Roberts is a pleasant 25 year old male with past history of depression and anxiety presented to [facility] and was admitted 3/17, originally presented to [facility] on 3/6 with paranoid delusions and auditory hallucinations along with worsening depression, he also had a recent history of smoking synthetic marijuana, he was started on Haldol and Tegretol with good effect, his psychosis started to clear he also had a CT head which needed noted on right temple lesion suspicious for an arachnoid cyst, psych MD consulted neurology and there was concern whether this might be contributing to his new onset psychosis and behavioral problems, neurology recommended admission for further work-up including EEG, MRI and serologies.  Paranoid delusions, auditory hallucinations Large arachnoid cyst Cannabis use disorder -I suspect substance abuse and primary psych disorder to be the etiology of his symptoms -Improving on current regimen Haldol, Cogentin and Tegretol -Appreciate neurology input, EEG pending, MRI notes large arachnoid cyst without surrounding edema -Autoimmune encephalitis panel ordered by neurology -continue workup per Neurology  Zannie Cove, MD

## 2020-11-04 NOTE — Consult Note (Signed)
NEUROLOGY CONSULTATION NOTE   Date of service: November 04, 2020 Patient Name: Cody Roberts MRN:  [id number] DOB:  [birth year] Reason for consult: "paranoid delusions" _ _ _   _ __   _ __ _ _  __ __   _ __   __ _  History of Present Illness  [name] is a 25 y.o. male with PMH significant for major depression, [facility] for [facility] delusions and transferred to [facility] today for work-up for potential neurological causes of his symptoms.  Describes as watching himself on TV and concerned about things happening to him and him being fearful.  He uses synthetic marijuana.  Also prior history of alcohol use but none since 2020.  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  He had work-up with CT head without contrast which demonstrated kown R temporal CSF density lesion likely representing an arachnoid cyst.  Kristy denies any complaints. Reports being at behavioral health for a week now. He vapes delta 8(synthetic marijuana) and has been doing so for the past year. His hallucinations began a couple weeks ago. Does endorse going up on the vape over the last few weeks and is now on about a gram a day of delta 8. Reports hallucinations are better since he has been at behavioral health and no way near as scary. Did see his dead dog and spiders yesterday.   ROS   Constitutional Denies weight loss, fever and chills.  HEENT Denies changes in vision and hearing.   Respiratory Denies SOB and cough.   CV Denies palpitations and CP   GI Denies abdominal pain, nausea, vomiting and diarrhea.   GU Denies dysuria and urinary frequency.   MSK Denies myalgia and joint pain.   Skin Denies rash and pruritus.   Neurological Denies headache and syncope.   Psychiatric Denies recent changes in mood. Denies anxiety and depression.    Past History   Past Medical History:  Diagnosis Date  . Chest tightness 04/18/2017  .  Dysthymia 04/19/2017   Referral downstairs to behavioral health for counseling, patient is advised on mental health safety precautions, can come to me if he would like to discuss meds  . Eustachian tube dysfunction, bilateral 04/19/2017   Canals clear and tympanic membranes normal, advised OTC Flonase and or second generation antihistamine Claritin/Zyrtec/Allegra or similar generic  . Sinusitis 05/18/2016  . Vision changes 05/18/2016   No past surgical history on file. History reviewed. No pertinent family history. Social History   Socioeconomic History  . Marital status: Married    Spouse name: Not on file  . Number of children: Not on file  . Years of education: Not on file  . Highest education level: Not on file  Occupational History  . Not on file  Tobacco Use  . Smoking status: Never Smoker  . Smokeless tobacco: Never Used  Vaping Use  . Vaping Use: Every day  . Substances: THC  Substance and Sexual Activity  . Alcohol use: No  . Drug use: Yes    Types: Marijuana    Comment: Delta 8  . Sexual activity: Not Currently  Other Topics Concern  . Not on file  Social History Narrative  . Not on file   Social Determinants of Health   Financial Resource Strain: Not on file  Food Insecurity: Not on file  Transportation Needs: Not on file  Physical Activity: Not on file  Stress: Not on  file  Social Connections: Not on file   No Known Allergies  Medications   Medications Prior to Admission  Medication Sig Dispense Refill Last Dose  . benztropine (COGENTIN) 0.5 MG tablet Take 1 tablet (0.5 mg total) by mouth 2 (two) times daily.     . carbamazepine (TEGRETOL) 100 MG chewable tablet Chew 2 tablets (200 mg total) by mouth 2 (two) times daily. 60 tablet 0   . feeding supplement (ENSURE ENLIVE / ENSURE PLUS) LIQD Take 237 mLs by mouth 2 (two) times daily between meals. 237 mL 12   . haloperidol (HALDOL) 5 MG tablet Take 1 tablet (5 mg total) by mouth 3 (three) times daily.      . haloperidol (HALDOL) 5 MG tablet Take 1 tablet (5 mg total) by mouth every 6 (six) hours as needed for agitation.     . haloperidol lactate (HALDOL) 5 MG/ML injection Inject 1 mL (5 mg total) into the muscle every 6 (six) hours as needed. 1 mL    . hydrOXYzine (ATARAX/VISTARIL) 25 MG tablet Take 1 tablet (25 mg total) by mouth 3 (three) times daily as needed for anxiety. 30 tablet 0   . LORazepam (ATIVAN) 2 MG tablet Take 1 tablet (2 mg total) by mouth every 6 (six) hours as needed (severe agitation). 30 tablet 0   . LORazepam (ATIVAN) 2 MG/ML injection Inject 1 mL (2 mg total) into the muscle every 6 (six) hours as needed (severe agitation). 1 mL 0   . metoprolol tartrate (LOPRESSOR) 25 MG tablet Take 1 tablet (25 mg total) by mouth 2 (two) times daily.     . traZODone (DESYREL) 50 MG tablet Take 1 tablet (50 mg total) by mouth at bedtime as needed for sleep.        Vitals   Vitals:   11/04/20 0306 11/04/20 0307  BP: 104/71 104/71  Pulse: (!) 59 64  Resp: 16 16  Temp: 98.8 F (37.1 C) 98.8 F (37.1 C)  TempSrc: Oral Oral  SpO2:  98%     There is no height or weight on file to calculate BMI.  Physical Exam   General: Laying comfortably in bed; in no acute distress.  HENT: Normal oropharynx and mucosa. Normal external appearance of ears and nose.  Neck: Supple, no pain or tenderness  CV: No JVD. No peripheral edema.  Pulmonary: Symmetric Chest rise. Normal respiratory effort.  Abdomen: Soft to touch, non-tender.  Ext: No cyanosis, edema, or deformity  Skin: No rash. Normal palpation of skin.   Musculoskeletal: Normal digits and nails by inspection. No clubbing.   Neurologic Examination  Mental status/Cognition: Alert, oriented to self, place, month and year, good attention.  Speech/language: Fluent, comprehension intact, object naming intact, repetition intact.  Cranial nerves:   CN II Pupils equal and reactive to light, no VF deficits    CN III,IV,VI EOM intact, no gaze  preference or deviation, no nystagmus    CN V normal sensation in V1, V2, and V3 segments bilaterally    CN VII no asymmetry, no nasolabial fold flattening    CN VIII normal hearing to speech    CN IX & X normal palatal elevation, no uvular deviation    CN XI 5/5 head turn and 5/5 shoulder shrug bilaterally    CN XII midline tongue protrusion    Motor:  Muscle bulk: normal, tone normal, pronator drift none tremor none Mvmt Root Nerve  Muscle Right Left Comments  SA C5/6 Ax Deltoid 5 5  EF C5/6 Mc Biceps 5 5   EE C6/7/8 Rad Triceps 5 5   WF C6/7 Med FCR     WE C7/8 PIN ECU     F Ab C8/T1 U ADM/FDI 5 5   HF L1/2/3 Fem Illopsoas 5 5   KE L2/3/4 Fem Quad 5 5   DF L4/5 D Peron Tib Ant 5 5   PF S1/2 Tibial Grc/Sol 5 5    Reflexes:  Right Left Comments  Pectoralis      Biceps (C5/6) 2 2   Brachioradialis (C5/6) 2 2    Triceps (C6/7) 2 2    Patellar (L3/4) 2+ 2+    Achilles (S1)      Hoffman      Plantar     Jaw jerk    Sensation:  Light touch Intact throughout   Pin prick    Temperature    Vibration   Proprioception    Coordination/Complex Motor:  - Finger to Nose intact BL - Heel to shin intact BL - Rapid alternating movement are normal - Gait: Deferred.  Labs   CBC:  Recent Labs  Lab 11/03/20 0634  WBC 4.5  NEUTROABS 2.7  HGB 15.5  HCT 45.7  MCV 87.2  PLT 184    Basic Metabolic Panel:  Lab Results  Component Value Date   NA 138 11/03/2020   K 3.8 11/03/2020   CO2 27 11/03/2020   GLUCOSE 138 (H) 11/03/2020   BUN 16 11/03/2020   CREATININE 1.24 11/03/2020   CALCIUM 9.4 11/03/2020   GFRNONAA >60 11/03/2020   GFRAA 102 09/05/2018   Lipid Panel:  Lab Results  Component Value Date   LDLCALC 146 (H) 10/24/2020   HgbA1c:  Lab Results  Component Value Date   HGBA1C 4.3 (L) 10/24/2020   Urine Drug Screen: No results found for: LABOPIA, COCAINSCRNUR, LABBENZ, AMPHETMU, THCU, LABBARB  Alcohol Level No results found for: ETH  CT Head without  contrast: No acute intracranial abnormality in a patient with known right temporal lobe CSF density lesion likely representing an arachnoid Cyst.  MRI Brain w + w/o C pending  cEEG: pending  Impression   GLADYS GUTMAN is a 25 y.o. male with PMH significant for major depression, [facility] and transferred to [facility] today for work-up for potential neurological causes of his symptoms. Neuro exam with no focal deficit. Flat affect. He doe have known R temporal arachnoid cyst, no obvious noted mass effect on the temporal lobe on [facility] thou. Althou symptoms are not classic, I do think it is important to evaluate for a potential autoimmune or epileptic cause for his symptoms.  Recommendations  - Recommend MRI Brain with and without contrast - recommend cEEG - I ordered serum Autoimmune encephalitis panel(sendout) ______________________________________________________________________   Thank you for the opportunity to take part in the care of this patient. If you have any further questions, please contact the neurology consultation attending.  Signed,  Erick Blinks [facility] Pager Number [phone] _ _ _   _ __   _ __ _ _  __ __   _ __   __ _

## 2020-11-04 NOTE — H&P (Signed)
History and Physical    Cody Roberts KCL:[id number] DOB: 02/25/1996 DOA: 11/04/2020  PCP: Emeterio Reeve, DO Patient coming from: [facility]  Chief Complaint: Delusions, hallucinations  HPI: Cody Roberts is a 25 y.o. male with a past medical history of depression, anxiety presenting to [facility] from [facility].  Patient presented to [facility] on 10/24/2020 secondary to paranoid delusions and auditory hallucinations.  Etiology of psychiatric issues not clear since he was recently smoking synthetic marijuana vs. primary psych issue.  He required 1:1 sitter during his admission.  He was initially tried on Risperdal and then Zyprexa with limited benefit in managing his behaviors and psychosis.  He was later transitioned to combination of Haldol and Tegretol to which he responded well.  Gradually his psychosis started to clear and dose of his Haldol was reduced to 5 mg 3 times daily along with Tegretol 200 mg twice daily.  Lab testing did not show leukocytosis, ESR 0, CRP <0.5, TSH normal, UDS positive for marijuana, B12 335, HIV nonreactive.  His Tegretol level was 5.7.  ANA, ceruloplasmin, heavy metal screen, and RPR pending.  Head CT revealed a lesion in his right temporal lobe suspicious for an arachnoid cyst.  Dr. Nelda Marseille consulted with neurology and given the size and location of the lesion, neurology recommended admission for additional work-up including continuous EEG monitoring and brain MRI with/without contrast.  It was unclear if the temporal lobe lesion could be contributing to his new onset psychosis and behavioral changes or if he instead had a primary psychotic disorder versus substance-induced psychosis that is clearing as he metabolizes a synthetic THC in his system.  Psych service will continue to consult while the patient is admitted at [facility].  Dr. Nelda Marseille recommended continuing his current psych meds regimen including Haldol 5 mg 3 times daily,  Cogentin 0.5 mg twice daily, and Tegretol 200 mg twice daily.  He is also on as needed Ativan but reportedly has not required any.  Dr. Nelda Marseille did not feel that he needed a 1:1 sitter at this point.  Patient states he is feeling much better with his current medication regimen.  States he was using delta 8 THC at home before his symptoms started.  Denies auditory or visual hallucinations at present.  Denies suicidal or homicidal ideation.  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  Denies fevers, chills, cough, shortness of breath, nausea, vomiting, abdominal pain, or diarrhea.  No other complaints.  Review of Systems:  All systems reviewed and apart from history of presenting illness, are negative.  Past Medical History:  Diagnosis Date  . Chest tightness 04/18/2017  . Dysthymia 04/19/2017   Referral downstairs to behavioral health for counseling, patient is advised on mental health safety precautions, can come to me if he would like to discuss meds  . Eustachian tube dysfunction, bilateral 04/19/2017   Canals clear and tympanic membranes normal, advised OTC Flonase and or second generation antihistamine Claritin/Zyrtec/Allegra or similar generic  . Sinusitis 05/18/2016  . Vision changes 05/18/2016    No past surgical history on file.   reports that he has never smoked. He has never used smokeless tobacco. He reports current drug use. Drug: Marijuana. He reports that he does not drink alcohol.  No Known Allergies  History reviewed. No pertinent family history.  Prior to Admission medications   Medication Sig Start Date End Date Taking? Authorizing Provider  benztropine (COGENTIN) 0.5 MG tablet Take 1 tablet (0.5 mg total) by mouth 2 (two) times daily. 11/03/20   Ethelene Hal, NP  carbamazepine (TEGRETOL) 100 MG chewable tablet Chew 2 tablets (200 mg total)  by mouth 2 (two) times daily. 11/03/20   Ethelene Hal, NP  feeding supplement (ENSURE ENLIVE / ENSURE PLUS) LIQD Take 237 mLs by mouth 2 (two) times daily between meals. 11/04/20   Ethelene Hal, NP  haloperidol (HALDOL) 5 MG tablet Take 1 tablet (5 mg total) by mouth 3 (three) times daily. 11/03/20   Ethelene Hal, NP  haloperidol (HALDOL) 5 MG tablet Take 1 tablet (5 mg total) by mouth every 6 (six) hours as needed for agitation. 11/03/20   Ethelene Hal, NP  haloperidol lactate (HALDOL) 5 MG/ML injection Inject 1 mL (5 mg total) into the muscle every 6 (six) hours as needed. 11/03/20   Ethelene Hal, NP  hydrOXYzine (ATARAX/VISTARIL) 25 MG tablet Take 1 tablet (25 mg total) by mouth 3 (three) times daily as needed for anxiety. 11/03/20   Ethelene Hal, NP  LORazepam (ATIVAN) 2 MG tablet Take 1 tablet (2 mg total) by mouth every 6 (six) hours as needed (severe agitation). 11/03/20   Ethelene Hal, NP  LORazepam (ATIVAN) 2 MG/ML injection Inject 1 mL (2 mg total) into the muscle every 6 (six) hours as needed (severe agitation). 11/03/20   Ethelene Hal, NP  metoprolol tartrate (LOPRESSOR) 25 MG tablet Take 1 tablet (25 mg total) by mouth 2 (two) times daily. 11/03/20   Ethelene Hal, NP  traZODone (DESYREL) 50 MG tablet Take 1 tablet (50 mg total) by mouth at bedtime as needed for sleep. 11/03/20   Ethelene Hal, NP    Physical Exam: Vitals:   11/04/20 0306 11/04/20 0307  BP: 104/71 104/71  Pulse: (!) 59 64  Resp: 16 16  Temp: 98.8 F (37.1 C) 98.8 F (37.1 C)  TempSrc: Oral Oral  SpO2:  98%    Physical Exam Constitutional:      General: He is not in acute distress. HENT:     Head: Normocephalic and atraumatic.  Eyes:     Extraocular Movements: Extraocular movements intact.     Conjunctiva/sclera: Conjunctivae normal.  Cardiovascular:     Rate and Rhythm: Normal rate and regular rhythm.     Pulses: Normal  pulses.  Pulmonary:     Effort: Pulmonary effort is normal. No respiratory distress.     Breath sounds: Normal breath sounds. No wheezing or rales.  Abdominal:     General: Bowel sounds are normal. There is no distension.     Palpations: Abdomen is soft.     Tenderness: There is no abdominal tenderness.  Musculoskeletal:        General: No swelling or tenderness.     Cervical back: Normal range of motion and neck supple.  Skin:    General: Skin is warm and dry.  Neurological:     General: No focal deficit present.     Mental Status: He is alert and oriented to person, place, and time.     Labs on Admission: I have personally reviewed following labs and imaging studies  CBC: Recent Labs  Lab 11/03/20 0634  WBC 4.5  NEUTROABS 2.7  HGB 15.5  HCT 45.7  MCV 87.2  PLT 324   Basic Metabolic Panel:  Recent Labs  Lab 11/03/20 0634  NA 138  K 3.8  CL 100  CO2 27  GLUCOSE 138*  BUN 16  CREATININE 1.24  CALCIUM 9.4   GFR: Estimated Creatinine Clearance: 73 mL/min (by C-G formula based on SCr of 1.24 mg/dL). Liver Function Tests: Recent Labs  Lab 11/03/20 0634  AST 31  ALT 31  ALKPHOS 46  BILITOT 1.3*  PROT 7.1  ALBUMIN 4.7   No results for input(s): LIPASE, AMYLASE in the last 168 hours. No results for input(s): AMMONIA in the last 168 hours. Coagulation Profile: No results for input(s): INR, PROTIME in the last 168 hours. Cardiac Enzymes: No results for input(s): CKTOTAL, CKMB, CKMBINDEX, TROPONINI in the last 168 hours. BNP (last 3 results) No results for input(s): PROBNP in the last 8760 hours. HbA1C: No results for input(s): HGBA1C in the last 72 hours. CBG: No results for input(s): GLUCAP in the last 168 hours. Lipid Profile: No results for input(s): CHOL, HDL, LDLCALC, TRIG, CHOLHDL, LDLDIRECT in the last 72 hours. Thyroid Function Tests: No results for input(s): TSH, T4TOTAL, FREET4, T3FREE, THYROIDAB in the last 72 hours. Anemia Panel: Recent Labs     11/03/20 0634  VITAMINB12 335   Urine analysis: No results found for: COLORURINE, APPEARANCEUR, LABSPEC, PHURINE, GLUCOSEU, HGBUR, BILIRUBINUR, KETONESUR, PROTEINUR, UROBILINOGEN, NITRITE, LEUKOCYTESUR  Radiological Exams on Admission: CT HEAD WO CONTRAST  Result Date: 11/03/2020 CLINICAL DATA:  Psychosis. EXAM: CT HEAD WITHOUT CONTRAST TECHNIQUE: Contiguous axial images were obtained from the base of the skull through the vertex without intravenous contrast. COMPARISON:  CT head 01/09/2004 FINDINGS: Brain: No evidence of large-territorial acute infarction. No parenchymal hemorrhage. No mass lesion. No extra-axial collection. No mass effect or midline shift. Interval increase in size of a 3.1 x 4.3 cm CSF density lesion in the lower represent medial and anterior right temporal lobe likely representing an arachnoid cyst. No hydrocephalus. Basilar cisterns are patent. Vascular: No hyperdense vessel. Skull: No acute fracture or focal lesion. Sinuses/Orbits: Paranasal sinuses and mastoid air cells are clear. The orbits are unremarkable. Other: None. IMPRESSION: No acute intracranial abnormality in a patient with known right temporal lobe CSF density lesion likely representing an arachnoid cyst. Electronically Signed   By: Iven Finn M.D.   On: 11/03/2020 00:19    Assessment/Plan Principal Problem:   Psychotic disorder [facility]) Active Problems:   Marijuana abuse   Temporal lobe lesion   Anxiety   Insomnia   Psychotic disorder Temporal lobe lesion Patient presented to [facility] on 10/24/2020 secondary to paranoid delusions and auditory hallucinations.  Etiology of psychiatric issues not clear since he was recently smoking synthetic marijuana vs. primary psych issue.  He required 1:1 sitter during his admission.  He was initially tried on Risperdal and then Zyprexa with limited benefit in managing his behaviors and psychosis.  He was later transitioned to  combination of Haldol and Tegretol to which he responded well.  Gradually his psychosis started to clear and dose of his Haldol was reduced to 5 mg 3 times daily along with Tegretol 200 mg twice daily.  Lab testing did not show leukocytosis, ESR 0, CRP <0.5, TSH normal, UDS positive for marijuana, B12 335, HIV nonreactive.  His Tegretol level was 5.7.  ANA, ceruloplasmin, heavy metal screen, and RPR pending.  Head CT revealed a lesion in his right temporal lobe suspicious for an arachnoid cyst.  Dr. Nelda Marseille consulted with neurology and given the size and location of the lesion, neurology  recommended admission for additional work-up including continuous EEG monitoring and brain MRI with/without contrast.  It was unclear if the temporal lobe lesion could be contributing to his new onset psychosis and behavioral changes or if he instead had a primary psychotic disorder versus substance-induced psychosis that is clearing as he metabolizes a synthetic THC in his system. Dr. Nelda Marseille recommended continuing his current psych meds regimen including Haldol 5 mg 3 times daily, Cogentin 0.5 mg twice daily, and Tegretol 200 mg twice daily.  He is also on as needed Ativan but reportedly has not required any.  Dr. Nelda Marseille did not feel that he needed a 1:1 sitter at this point.  Patient denies any auditory or visual hallucinations at present.  Denies suicidal or homicidal ideation. -Neurology has been consulted for continuous EEG and brain MRI ordered.  Psych service will continue to consult while the patient is admitted to [facility].  Continue Haldol 5 mg 3 times daily, Cogentin 0.5 mg twice daily, and Tegretol 200 mg twice daily.  EEG ordered to monitor QT interval.  Marijuana abuse -Counseled to quit.  Anxiety -He was receiving hydroxyzine 25 mg 3 times daily as needed at behavioral health, continue.  He was also on as needed Ativan but reportedly has not required any behavioral health.  Insomnia -He was  receiving trazodone 50 mg at bedtime as needed at behavioral health, continue.  DVT prophylaxis: Lovenox Code Status: Full code Family Communication: No family available at this time. Disposition Plan: Status is: Observation  The patient remains OBS appropriate and will d/c before 2 midnights.  Dispo: The patient is from: Home              Anticipated d/c is to: Home              Patient currently is not medically stable to d/c.   Difficult to place patient No  Level of care: MedSurg  The medical decision making on this patient was of high complexity and the patient is at high risk for clinical deterioration, therefore this is a level 3 visit  Shela Leff MD [facility]  If 7PM-7AM, please contact night-coverage [email or web address]  11/04/2020, 6:08 AM

## 2020-11-04 NOTE — Progress Notes (Signed)
Nurs Dischg Note:  D:Patient denies SI/HI/AVH at this time. Pt appears calm and cooperative, and no distress noted. Pt D/C to [facility] for MRI and continuous EEG monitoring to see if this cystic lesion could be contributing to behavioral changes  A: All Personal items in locker returned to pt. Pt given AVS/ SRA / BH Transition/ Copy .Pt escorted to the lobby to wait for safe transport with sitter .  R:  Pt States he will comply with outpatient / other services, and take MEDS as prescribed.

## 2020-11-05 DIAGNOSIS — G930 Cerebral cysts: Secondary | ICD-10-CM | POA: Diagnosis present

## 2020-11-05 DIAGNOSIS — F1210 Cannabis abuse, uncomplicated: Secondary | ICD-10-CM

## 2020-11-05 DIAGNOSIS — G939 Disorder of brain, unspecified: Secondary | ICD-10-CM

## 2020-11-05 DIAGNOSIS — F29 Unspecified psychosis not due to a substance or known physiological condition: Secondary | ICD-10-CM | POA: Diagnosis not present

## 2020-11-05 DIAGNOSIS — R569 Unspecified convulsions: Secondary | ICD-10-CM

## 2020-11-05 DIAGNOSIS — F2081 Schizophreniform disorder: Secondary | ICD-10-CM

## 2020-11-05 NOTE — Progress Notes (Signed)
Discharge instructions (including medications) discussed with and copy provided to patient/caregiver 

## 2020-11-05 NOTE — Plan of Care (Signed)
  Problem: Education: Goal: Knowledge of General Education information will improve Description: Including pain rating scale, medication(s)/side effects and non-pharmacologic comfort measures Outcome: Adequate for Discharge   

## 2020-11-05 NOTE — Procedures (Addendum)
Patient Name: Cody Roberts  MRN: [id number]  Epilepsy Attending: Charlsie Quest  Referring Physician/Provider: Dr. Erick Blinks Duration: 11/04/2020 1028 to 11/05/2020 1115  Patient history: 25 year old male who presented with delusions and was found to have a right temporal ecchymosis.  EEG to evaluate for seizures.  Level of alertness: Awake, asleep  AEDs during EEG study: Carbamazepine  Technical aspects: This EEG study was done with scalp electrodes positioned according to the 10-20 International system of electrode placement. Electrical activity was acquired at a sampling rate of 500Hz  and reviewed with a high frequency filter of 70Hz  and a low frequency filter of 1Hz . EEG data were recorded continuously and digitally stored.   Description: The posterior dominant rhythm consists of 8-9 Hz activity of moderate voltage (25-35 uV) seen predominantly in posterior head regions, symmetric and reactive to eye opening and eye closing.  EEG also showed intermittent generalized and lateralized right hemisphere 4 to 5 Hz theta slowing. Hyperventilation and photic stimulation were not performed.     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  No seizures or epileptiform discharges were seen throughout the recording.  [name] 

## 2020-11-05 NOTE — Discharge Summary (Signed)
Physician Discharge Summary  Cody Roberts DZH:[id number] DOB: 17-Mar-1996 DOA: 11/04/2020  PCP: Sunnie Nielsen, DO  Admit date: 11/04/2020 Discharge date: 11/05/2020  Time spent: 35 minutes  Recommendations for Outpatient Follow-up:  1. Outpatient psychiatry in 2 weeks, titrate meds as appropriate 2. Neurology referral sent for arachnoid cyst follow-up   Discharge Diagnoses:  Principal Problem:   Psychotic disorder (HCC) Active Problems:   Marijuana abuse   Temporal lobe lesion   Anxiety   Insomnia   Arachnoid cyst   Discharge Condition: Stable  Diet recommendation: Regular  There were no vitals filed for this visit.  History of present illness:  pleasant 25 year old male with past history of depression and anxiety presented to [facility] and was admitted 3/17, originally presented to [facility] on 3/6 with paranoid delusions and auditory hallucinations along with worsening depression, he also had a recent history of smoking synthetic marijuana, he was started on Haldol and Tegretol with good effect, his psychosis started to clear he also had a CT head which needed noted on right temple lesion suspicious for an arachnoid cyst, psych MD consulted neurology and there was concern whether this might be contributing to his new onset psychosis and behavioral problems, neurology recommended admission for further work-up including EEG, MRI and serologies.  Hospital Course:   Paranoid delusions, auditory hallucinations Large arachnoid cyst Cannabis use disorder -Suspect symptoms are secondary to substance abuse and primary psych disorder -Seen by psychiatry and started on Haldol, Cogentin and Tegretol  -improving on current regimen Haldol, Cogentin and Tegretol -Appreciate neurology input, EEG was unremarkable, MRI notes large arachnoid cyst without surrounding edema -Autoimmune encephalitis panel ordered by neurology, unlikely low yield but pending at the time of discharge -Referral sent to  outpatient neurology for arachnoid cyst follow-up -Outpatient follow-up with psychiatry, resources given by social work team  Consultations:  Neurology  Discharge Exam: Vitals:   11/05/20 0838 11/05/20 1150  BP: 113/85 113/74  Pulse: (!) 58 61  Resp: 16 16  Temp: 97.9 F (36.6 C) 98.6 F (37 C)  SpO2: 100% 97%    General: A&O x3 Cardiovascular: S1-S2, regular rate rhythm Respiratory: Clear  Discharge Instructions   Discharge Instructions    Ambulatory referral to Neurology   Complete by: As directed    An appointment is requested in approximately: 4 weeks   Diet general   Complete by: As directed    Increase activity slowly   Complete by: As directed      Allergies as of 11/05/2020   No Known Allergies     Medication List    STOP taking these medications   feeding supplement Liqd   haloperidol lactate 5 MG/ML injection Commonly known as: HALDOL   LORazepam 2 MG tablet Commonly known as: ATIVAN   LORazepam 2 MG/ML injection Commonly known as: ATIVAN   metoprolol tartrate 25 MG tablet Commonly known as: LOPRESSOR     TAKE these medications   benztropine 0.5 MG tablet Commonly known as: COGENTIN Take 1 tablet (0.5 mg total) by mouth 2 (two) times daily.   carbamazepine 100 MG chewable tablet Commonly known as: TEGRETOL Chew 2 tablets (200 mg total) by mouth 2 (two) times daily.   haloperidol 5 MG tablet Commonly known as: HALDOL Take 1 tablet (5 mg total) by mouth 3 (three) times daily. What changed: Another medication with the same name was removed. Continue taking this medication, and follow the directions you see here.   hydrOXYzine 25 MG tablet Commonly known as: ATARAX/VISTARIL Take  1 tablet (25 mg total) by mouth 3 (three) times daily as needed for anxiety.   traZODone 50 MG tablet Commonly known as: DESYREL Take 1 tablet (50 mg total) by mouth at bedtime as needed for sleep.      No Known Allergies  Follow-up Information     Sunnie Nielsenlexander, Natalie, DO. Schedule an appointment as soon as possible for a visit in 1 week(s).   Specialty: Osteopathic Medicine Contact information: [address] [place] [phone]        Psych Follow up in 2 week(s).                The results of significant diagnostics from this hospitalization (including imaging, microbiology, ancillary and laboratory) are listed below for reference.    Significant Diagnostic Studies: CT HEAD WO CONTRAST  Result Date: 11/03/2020 CLINICAL DATA:  Psychosis. EXAM: CT HEAD WITHOUT CONTRAST TECHNIQUE: Contiguous axial images were obtained from the base of the skull through the vertex without intravenous contrast. COMPARISON:  CT head 01/09/2004 FINDINGS: Brain: No evidence of large-territorial acute infarction. No parenchymal hemorrhage. No mass lesion. No extra-axial collection. No mass effect or midline shift. Interval increase in size of a 3.1 x 4.3 cm CSF density lesion in the lower represent medial and anterior right temporal lobe likely representing an arachnoid cyst. No hydrocephalus. Basilar cisterns are patent. Vascular: No hyperdense vessel. Skull: No acute fracture or focal lesion. Sinuses/Orbits: Paranasal sinuses and mastoid air cells are clear. The orbits are unremarkable. Other: None. IMPRESSION: No acute intracranial abnormality in a patient with known right temporal lobe CSF density lesion likely representing an arachnoid cyst. Electronically Signed   By: Tish FredericksonMorgane  Naveau M.D.   On: 11/03/2020 00:19   MR BRAIN W WO CONTRAST  Result Date: 11/04/2020 CLINICAL DATA:  Brain mass or lesion EXAM: MRI HEAD WITHOUT AND WITH CONTRAST TECHNIQUE: Multiplanar, multiecho pulse sequences of the brain and surrounding structures were obtained without and with intravenous contrast. CONTRAST:  6mL GADAVIST GADOBUTROL 1 MMOL/ML IV SOLN COMPARISON:  Head CT from 2 days ago FINDINGS: Brain: CSF intensity mass at the right middle  cranial fossa with temporal lobe and MCA mass effect, 4.4 x 3.2 x 3.6 cm. No internal complexity or multiplicity to suggest an infectious cyst. Dimensions are increased from a 2005 head CT report (23 x 38 mm at that time). No brain edema or gliosis seen in the adjacent temporal lobe. No infarct, hydrocephalus, or intra-axial mass. No chronic blood products. Vascular: Normal flow voids and vascular enhancements. Skull and upper cervical spine: Relative expansion of the right middle cranial fossa related to the long-standing cyst. Sinuses/Orbits: Negative IMPRESSION: 4.4 x 3.6 x 3.2 cm arachnoid cyst in the right middle cranial fossa with enlargement since a 2005 head CT report. No edema in the compressed right temporal lobe. Electronically Signed   By: Marnee SpringJonathon  Watts M.D.   On: 11/04/2020 06:49   EEG adult  Result Date: 11/04/2020 Charlsie QuestYadav, Priyanka O, MD     11/04/2020 11:27 AM Patient Name: Cody Roberts MRN: [id number] Epilepsy Attending: Charlsie QuestPriyanka O Yadav Referring Physician/Provider: Dr. Erick BlinksSalman Khaliqdina Date: 11/04/2020 Duration: 31.55 mins Patient history: 25 year old male who presented with delusions and was found to have a right temporal ecchymosis.  EEG to evaluate for seizures. Level of alertness: Awake AEDs during EEG study: Carbamazepine Technical aspects: This EEG study was done with scalp electrodes positioned according to the 10-20 International system of electrode placement. Electrical activity was acquired at  a sampling rate of 500Hz  and reviewed with a high frequency filter of 70Hz  and a low frequency filter of 1Hz . EEG data were recorded continuously and digitally stored. Description: The posterior dominant rhythm consists of 8-9 Hz activity of moderate voltage (25-35 uV) seen predominantly in posterior head regions, symmetric and reactive to eye opening and eye closing.  EEG also showed intermittent generalized and lateralized right hemisphere 4 to 5 Hz theta slowing.  Hyperventilation and photic  stimulation were not performed.   ABNORMALITY -Intermittent slow, generalized and lateralized right hemisphere IMPRESSION: This study is suggestive of cortical dysfunction and right hemisphere likely secondary to underlying cyst as well as mild diffuse encephalopathy, nonspecific etiology.  No seizures or epileptiform discharges were seen throughout the recording. Priyanka   Overnight EEG with video  Result Date: 11/05/2020 , MD     11/05/2020  9:26 AM Patient Name: Cody Roberts MRN: Charlsie Quest Epilepsy Attending: 11/07/2020 Referring Physician/Provider: Dr. Sherene Sires Duration: 11/04/2020 1028 to 11/05/2020 0930  Patient history: 25 year old male who presented with delusions and was found to have a right temporal ecchymosis.  EEG to evaluate for seizures.  Level of alertness: Awake, asleep  AEDs during EEG study: Carbamazepine  Technical aspects: This EEG study was done with scalp electrodes positioned according to the 10-20 International system of electrode placement. Electrical activity was acquired at a sampling rate of 500Hz  and reviewed with a high frequency filter of 70Hz  and a low frequency filter of 1Hz . EEG data were recorded continuously and digitally stored.  Description: The posterior dominant rhythm consists of 8-9 Hz activity of moderate voltage (25-35 uV) seen predominantly in posterior head regions, symmetric and reactive to eye opening and eye closing.  EEG also showed intermittent generalized and lateralized right hemisphere 4 to 5 Hz theta slowing. Hyperventilation and photic stimulation were not performed.    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  No seizures or epileptiform discharges were seen throughout the recording.  11/06/2020    Microbiology: Recent Results (from the past 240 hour(s))  Resp Panel by RT-PCR (Flu A&B, Covid) Nasopharyngeal Swab     Status: None   Collection Time: 10/26/20 10:41 PM   Specimen: Nasopharyngeal Swab; Nasopharyngeal(NP) swabs in vial transport medium  Result Value Ref Range Status   SARS Coronavirus 2 by RT PCR NEGATIVE NEGATIVE Final    Comment: (NOTE) SARS-CoV-2 target nucleic acids are NOT DETECTED.  The SARS-CoV-2 RNA is generally detectable in upper respiratory specimens during the acute phase of infection. The lowest concentration of SARS-CoV-2 viral copies this assay can detect is 138 copies/mL. A negative result does not preclude SARS-Cov-2 infection and should not be used as the sole basis for treatment or other patient management decisions. A negative result may occur with  improper specimen collection/handling, submission of specimen other than nasopharyngeal swab, presence of viral mutation(s) within the areas targeted by this assay, and inadequate number of viral copies(<138 copies/mL). A negative result must be combined with clinical observations, patient history, and epidemiological information. The expected result is Negative.  Fact Sheet for Patients:  25  Fact Sheet for Healthcare Providers:   This test is no t yet approved or cleared by the FDA and  has been authorized for detection and/or diagnosis of SARS-CoV-2 by FDA  under an Emergency Use Authorization (EUA). This EUA will remain  in effect (meaning this test can be used) for the duration of the COVID-19 declaration under Section 564(b)(1) of the Act, 21 U.S.C.section 360bbb-3(b)(1), unless the authorization is terminated  or revoked sooner.       Influenza A by PCR NEGATIVE NEGATIVE Final   Influenza B by PCR NEGATIVE NEGATIVE Final    Comment: (NOTE) The Xpert Xpress SARS-CoV-2/FLU/RSV plus  assay is intended as an aid in the diagnosis of influenza from Nasopharyngeal swab specimens and should not be used as a sole basis for treatment. Nasal washings and aspirates are unacceptable for Xpert Xpress SARS-CoV-2/FLU/RSV testing.  Fact Sheet for Patients: BloggerCourse.com  Fact Sheet for Healthcare Providers: SeriousBroker.it  This test is not yet approved or cleared by the [place] FDA and has been authorized for detection and/or diagnosis of SARS-CoV-2 by FDA under an Emergency Use Authorization (EUA). This EUA will remain in effect (meaning this test can be used) for the duration of the COVID-19 declaration under Section 564(b)(1) of the Act, 21 U.S.C. section 360bbb-3(b)(1), unless the authorization is terminated or revoked.  Performed at [facility], [address], [place], [place] [address]      Labs: Basic Metabolic Panel: Recent Labs  Lab 11/03/20 0634  NA 138  K 3.8  CL 100  CO2 27  GLUCOSE 138*  BUN 16  CREATININE 1.24  CALCIUM 9.4   Liver Function Tests: Recent Labs  Lab 11/03/20 0634  AST 31  ALT 31  ALKPHOS 46  BILITOT 1.3*  PROT 7.1  ALBUMIN 4.7   No results for input(s): LIPASE, AMYLASE in the last 168 hours. No results for input(s): AMMONIA in the last 168 hours. CBC: Recent Labs  Lab 11/03/20 0634  WBC 4.5  NEUTROABS 2.7  HGB 15.5  HCT 45.7  MCV 87.2  PLT 184   Cardiac Enzymes: No results for input(s): CKTOTAL, CKMB, CKMBINDEX, TROPONINI in the last 168 hours. BNP: BNP (last 3 results) No results for input(s): BNP in the last 8760 hours.  ProBNP (last 3 results) No results for input(s): PROBNP in the last 8760 hours.  CBG: No results for input(s): GLUCAP in the last 168 hours.     Signed:  Zannie Cove MD.  [facility] 11/05/2020, 1:45 PM

## 2020-11-05 NOTE — Progress Notes (Signed)
CSW met with patient and provided list of psychiatry resources in the area. CSW explained to patient to also reach out to his insurance provider to find psychiatrists in network and make an appointment, and he indicated understanding. CSW also placed information for [facility] walk-in appointments and [facility] on the patient's AVS, and told him that there would be additional information on his discharge paperwork for assistance.  No other TOC needs at this time.   Laveda Abbe, [place] Clinical Social Worker [phone]

## 2020-11-05 NOTE — Progress Notes (Signed)
Pt HR this am 58, morning metoprolol was not given, moitiored HR with rates ranging 58-63. Spoke with Dr Jomarie Longs at this time, last BP at 11:50am was 113/74 HR 61 (BP at 0838 this am was 113/85, 58 HR)  Pt stated this am he has not been on any meds at home. Dr Jomarie Longs ordered not to give med (metoprolol)

## 2020-11-05 NOTE — Progress Notes (Signed)
eeg removal no skin breakdown

## 2020-11-05 NOTE — Progress Notes (Addendum)
Neurology Progress Note Reason for Consult: First break psychosis Referring Physician: Dr. Nelda Marseille  CC: Delusions and hallucinations  History is obtained from:Chart and patient  HPI: Cody Roberts is a 25 y.o. male with a past medical history of depression, anxiety presenting to [facility] from [facility].  Patient presented to [facility] on 10/24/2020 secondary to paranoid delusions and auditory hallucinations. Patient presented to [facility] with delusions, hallucinations, poor sleep and bizarre behavior. Patient UDS was positive for [facility] and patient admitted to smoking Delta- 8, synthetic THC. There was concern for Substance induced psychosis vs. Organic psychotic break with recent THC use a confounding factor. Patient was initally trialed on Risperdal and then Zyprexa with no success. Patient was then transitioned to Haldol to which he has responded well. Patient has been able to participate with his EEG LTM. Patient does not continue to endorse delusions or hallucinations and reports that he feels back at his baseline. Patient also reported that he has been sleeping better. Patient endorsed that he had been aware of the arachnoid cyst in his brain since childhood.   Exam: Vitals:   11/05/20 0358 11/05/20 0838  BP: 103/86 113/85  Pulse: 60 (!) 58  Resp: 18 16  Temp: 97.7 F (36.5 C) 97.9 F (36.6 C)  SpO2: 96% 100%   Gen: In bed, NAD HENT: Normal oropharynx and mucosa. Normal external appearance of ears and nose.  CV: No JVD. No peripheral edema.  Resp: non-labored breathing, no acute distress Abd: soft, nt Ext: No cyanosis, edema, or deformity  Skin: No rash. Normal palpation of skin.    Neuro: MS: Alert and oriented to self, time, place, and situation.  Patient maintains attention and ask appropriate questions. Speech: Fluent, comprehension intact, naming intact, Patient is able to follow commands and have conversation.  CN II Pupils equal and reactive  to light, 71mm at baseline, no VF deficits    CN III,IV,VI EOM intact, no gaze preference or deviation, no nystagmus    CN V normal sensation in V1, V2, and V3 segments bilaterally    CN VII no asymmetry, no nasolabial fold flattening    CN VIII normal hearing to speech    CN IX & X normal palatal elevation, no uvular deviation    CN XI 5/5 shoulder shrug bilaterally    CN XII midline tongue protrusion , no fasiculations    Motor: 5/5 strength in all extremities.  Sensory: Intact to light touch in all extremities DTR:2+ brachioradialis, triceps, patellar bilaterally Toes down going bilaterally FTN intact, no pronator dirft Gait deffered Pertinent Labs: CBC Latest Ref Rng & Units 11/03/2020 10/24/2020 09/05/2018  WBC 4.0 - 10.5 K/uL 4.5 6.4 3.0(L)  Hemoglobin 13.0 - 17.0 g/dL 15.5 16.1 17.3(H)  Hematocrit 39.0 - 52.0 % 45.7 42.7 47.6  Platelets 150 - 400 K/uL 184 225 207   CMP Latest Ref Rng & Units 11/03/2020 10/24/2020 09/05/2018  Glucose 70 - 99 mg/dL 138(H) 84 88  BUN 6 - 20 mg/dL $Remove'16 13 10  'dQrhVSh$ Creatinine 0.61 - 1.24 mg/dL 1.24 1.21 1.17  Sodium 135 - 145 mmol/L 138 134(L) 137  Potassium 3.5 - 5.1 mmol/L 3.8 3.6 4.8  Chloride 98 - 111 mmol/L 100 97(L) 101  CO2 22 - 32 mmol/L $RemoveB'27 24 28  'dUxyiCyy$ Calcium 8.9 - 10.3 mg/dL 9.4 10.0 10.2  Total Protein 6.5 - 8.1 g/dL 7.1 - 7.2  Total Bilirubin 0.3 - 1.2 mg/dL 1.3(H) - 1.7(H)  Alkaline Phos 38 - 126  U/L 46 - -  AST 15 - 41 U/L 31 - 17  ALT 0 - 44 U/L 31 - 13  ANA w/ reflex: Negative Ceruloplasmin 16.7 RPR: Negative Carbemezapine level: 5.7 Vit B12: 335 HIV: negative ESR: 0 CRP: <0.5  Imaging MRI 11/04/2020- IMPRESSION: 4.4 x 3.6 x 3.2 cm arachnoid cyst in the right middle cranial fossa with enlargement since a 2005 head CT report. No edema in the compressed right temporal lobe.  Impression:  Cody Roberts is a 25 yo patient who presented with new onset psychosis in the setting of recent synthetic THC use. However as part of a first psychotic  break work patient underwent Head CT and was noted to have a large probable arachnoid cyst in the R temporal region. Due to this patient is at increased risk for seizures. Patient hx includes documentation of this cyst in 2005. MRI and CT during this hospitalization suggests that that cyst has grown, but does not appear to be causing edema or significant pressure on the brain. It is likely that the brain and cyst have grown together. Patient EEG LTM has not shown any seizure activity, but did show some slowing in the R hemisphere which is likely 2/2 the arachnoid cyst.   On physical exam today patient appears stable and is no longer endorsing hallucinations, delusions, or bizarre behavior and appears to be resting well. At this time patient psychiatric symptoms are improved and patient has been placed on AED to help with his mood symptoms.  At this time Neurology agrees with current medication regimen.   Recommendations: - Continue Haldol $RemoveBeforeD'5mg'iazxPCjIjMlKeT$  TID, Tegretol $RemoveBefor'200mg'kxHLUclnMbXB$  BID, Cogentin 0.$RemoveBeforeD'5mg'WMYlrSCkMyLXIu$  BID - Neurology will sign off - Continue to emphasize that patient not smoke any forms of THC  Damita Dunnings, MD PGY-1  I have examined the patient and agree with resident note above. EEG with expected R sided slowing 2/2 known RT cystic lesion but showed no interictals or seizures overnight. Neurology to sign off, dispo per psychiatry and hospitalist teams (transfer back to behavioral health hospital if ongoing psych needs).  Su Monks, MD [facility] [phone]  If 7pm- 7am, please page neurology on call as listed in [place].

## 2020-11-05 NOTE — Progress Notes (Signed)
No skin breakdown at fp1, f3 and c3

## 2020-11-05 NOTE — Progress Notes (Addendum)
Psychiatry Progress Note  11/05/2020 2:06 PM Cody Roberts  MRN:  [id number]   Subjective: No acute overnight events.  Patient evaluated at bedside this morning, with mother present in the room.  Patient denies any new complaints.  He denies any suicidal or homicidal ideation.  He denies any auditory or visual hallucinations.  He states his mood is good and he understands he went to hold this because he was smoking THC and plan not to do it again.  He states his medication is working out good for him and would like to continue taking it.  Principal Problem: Psychotic disorder (HCC) Diagnosis: Principal Problem:   Psychotic disorder (HCC) Active Problems:   Marijuana abuse   Temporal lobe lesion   Anxiety   Insomnia   Arachnoid cyst  Total Time spent with patient: 25 minutes  Past Psychiatric History: Patient had 1 psychiatric hospitalization.  He sees a therapist and psychiatrist as an outpatient for major depressive disorder he has taken fluoxetine and Wellbutrin in the past but never really took Wellbutrin.  Past Medical History:  Past Medical History:  Diagnosis Date  . Chest tightness 04/18/2017  . Dysthymia 04/19/2017   Referral downstairs to behavioral health for counseling, patient is advised on mental health safety precautions, can come to me if he would like to discuss meds  . Eustachian tube dysfunction, bilateral 04/19/2017   Canals clear and tympanic membranes normal, advised OTC Flonase and or second generation antihistamine Claritin/Zyrtec/Allegra or similar generic  . Sinusitis 05/18/2016  . Vision changes 05/18/2016   No past surgical history on file. Family History: History reviewed. No pertinent family history. Family Psychiatric  History: Noncontributory Social History:  Social History   Substance and Sexual Activity  Alcohol Use No     Social History   Substance and Sexual Activity  Drug Use Yes  . Types: Marijuana   Comment: Delta 8    Social History    Socioeconomic History  . Marital status: Married    Spouse name: Not on file  . Number of children: Not on file  . Years of education: Not on file  . Highest education level: Not on file  Occupational History  . Not on file  Tobacco Use  . Smoking status: Never Smoker  . Smokeless tobacco: Never Used  Vaping Use  . Vaping Use: Every day  . Substances: THC  Substance and Sexual Activity  . Alcohol use: No  . Drug use: Yes    Types: Marijuana    Comment: Delta 8  . Sexual activity: Not Currently  Other Topics Concern  . Not on file  Social History Narrative  . Not on file   Social Determinants of Health   Financial Resource Strain: Not on file  Food Insecurity: Not on file  Transportation Needs: Not on file  Physical Activity: Not on file  Stress: Not on file  Social Connections: Not on file   Additional Social History: Patient lives with grandmother   Sleep: Good  Appetite:  Good  Current Medications: Current Facility-Administered Medications  Medication Dose Route Frequency Provider Last Rate Last Admin  . acetaminophen (TYLENOL) tablet 650 mg  650 mg Oral Q6H PRN John Giovanni, MD       Or  . acetaminophen (TYLENOL) suppository 650 mg  650 mg Rectal Q6H PRN John Giovanni, MD      . benztropine (COGENTIN) tablet 0.5 mg  0.5 mg Oral BID John Giovanni, MD   0.5 mg at 11/05/20 1043  .  carbamazepine (TEGRETOL) chewable tablet 200 mg  200 mg Oral BID John Giovanni, MD   200 mg at 11/05/20 1044  . enoxaparin (LOVENOX) injection 40 mg  40 mg Subcutaneous Q24H John Giovanni, MD   40 mg at 11/05/20 1045  . haloperidol (HALDOL) tablet 5 mg  5 mg Oral TID John Giovanni, MD   5 mg at 11/05/20 1044  . hydrOXYzine (ATARAX/VISTARIL) tablet 25 mg  25 mg Oral TID PRN John Giovanni, MD      . metoprolol tartrate (LOPRESSOR) tablet 25 mg  25 mg Oral BID John Giovanni, MD   25 mg at 11/04/20 2118  . traZODone (DESYREL) tablet 50 mg  50 mg  Oral QHS PRN John Giovanni, MD        Lab Results: No results found for this or any previous visit (from the past 48 hour(s)).  Blood Alcohol level:  No results found for: [facility]  Metabolic Disorder Labs: Lab Results  Component Value Date   HGBA1C 4.3 (L) 10/24/2020   MPG 76.71 10/24/2020   No results found for: PROLACTIN Lab Results  Component Value Date   CHOL 214 (H) 10/24/2020   TRIG 57 10/24/2020   HDL 57 10/24/2020   CHOLHDL 3.8 10/24/2020   VLDL 11 10/24/2020   LDLCALC 146 (H) 10/24/2020    Musculoskeletal: Strength & Muscle Tone: within normal limits Gait & Station: normal Patient leans: N/A  Psychiatric Specialty Exam: Physical Exam Vitals and nursing note reviewed.  Constitutional:      Appearance: Normal appearance.  HENT:     Head: Normocephalic and atraumatic.     Nose: Nose normal.     Mouth/Throat:     Mouth: Mucous membranes are moist.  Cardiovascular:     Rate and Rhythm: Normal rate and regular rhythm.     Pulses: Normal pulses.  Musculoskeletal:        General: Normal range of motion.     Cervical back: Normal range of motion.  Neurological:     Mental Status: He is alert and oriented to person, place, and time.     Review of Systems  Blood pressure 113/74, pulse 61, temperature 98.6 F (37 C), temperature source Oral, resp. rate 16, SpO2 97 %.There is no height or weight on file to calculate BMI.  General Appearance: Casual  Eye Contact:  Fair  Speech:  Normal Rate  Volume:  Normal  Mood:  Anxious  Affect:  Appropriate  Thought Process:  Coherent  Orientation:  Full (Time, Place, and Person)  Thought Content:  Logical  Suicidal Thoughts:  No  Homicidal Thoughts:  No  Memory:  Immediate;   Good Recent;   Good Remote;   Good  Judgement:  Good  Insight:  Good  Psychomotor Activity:  Normal  Concentration:  Concentration: Good and Attention Span: Good  Recall:  Good  Fund of Knowledge:  Good  Language:  Good  Akathisia:  No   Handed:  Right  AIMS (if indicated):     Assets:  Communication Skills Desire for Improvement Financial Resources/Insurance Housing Physical Health Resilience Social Support  ADL's:  Intact  Cognition:  WNL  Sleep:      Assessment: 25 year old male with past psychiatric history of major depressive disorder, anxiety, marijuana use disorder presented to [facility] with new onset psychosis.  He was transferred to [facility] for neurological evaluation. 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  He denies any auditory or visual hallucinations and understands that he had a psychotic episodes and he needs to stop smoking marijuana and take his medications on time.  He is open to going back to inpatient psych or home based on his providers judgment.  He does not seem like responding to internal stimuli and was calm and cooperative during the interview.  Mother was also present and she stated that she thinks patient is doing much better and she would be comfortable taking him home if the provider suggest that. -On CT head patient was found to have a large arachnoid cyst in the right temporal region.  Due to this patient is at increased risk for seizures and patient history include documentation of this cyst since 2005.  MRI and CT scan during this hospitalization suggest that cyst has grown, but does not appear to be causing edema or significant pressure on the brain.  Patient long-term EEG has not shown any seizure-like activity but did show some slowing in the right hemisphere which is likely secondary to arachnoid cyst.  At this time neurology does not have any more recommendation. -Consulted TOC for outpatient appointment for this patient in 7 to 10 days with psychiatry but social worker Blenda Nicely reported that she is not responsible for making an outpatient  appointment for psychiatry and she has confirmed this with her supervisor.  I talked to her via telephone and got the same response.  Patient should not leave the hospital without outpatient appointment with psychiatry.  As far as my understanding, it is responsibility of our social worker to help patient getting necessary outpatient appointments or referrals.  Treatment Plan: --Continue Tegretol 200 mg twice daily for mood stabilization --Continue Haldol 5 mg 3 times daily for psychosis --Continue hydroxyzine 25 mg 3 times daily as needed for anxiety --Continue trazodone 50 mg at bedtime as needed for sleep --Psychiatry is signing off but will be available for any further questions  Disposition: --No imminent risk to self or others --Does not meet criteria for inpatient psychiatric hospitalization   Arnoldo Lenis, MD 11/05/2020, 2:06 PM  PGY-1, Resident

## 2020-11-08 ENCOUNTER — Other Ambulatory Visit: Payer: Self-pay

## 2020-11-08 ENCOUNTER — Encounter: Payer: Self-pay | Admitting: Osteopathic Medicine

## 2020-11-08 ENCOUNTER — Ambulatory Visit: Payer: BC Managed Care – PPO | Admitting: Osteopathic Medicine

## 2020-11-08 VITALS — BP 122/69 | HR 77

## 2020-11-08 DIAGNOSIS — L568 Other specified acute skin changes due to ultraviolet radiation: Secondary | ICD-10-CM | POA: Diagnosis not present

## 2020-11-08 DIAGNOSIS — F2081 Schizophreniform disorder: Secondary | ICD-10-CM

## 2020-11-08 DIAGNOSIS — G930 Cerebral cysts: Secondary | ICD-10-CM | POA: Diagnosis not present

## 2020-11-08 LAB — HEAVY METALS, BLOOD
Arsenic: <1 ug/L (ref 0–9)
Lead: <1 ug/dL (ref 0–4)
Mercury: <1 ug/L (ref 0.0–14.9)

## 2020-11-08 MED ORDER — HALOPERIDOL 5 MG PO TABS: 5.0000 mg | ORAL_TABLET | Freq: Three times a day (TID) | ORAL | 1 refills | Status: DC

## 2020-11-08 MED ORDER — CARBAMAZEPINE 100 MG PO CHEW
200.0000 mg | CHEWABLE_TABLET | Freq: Two times a day (BID) | ORAL | 1 refills | Status: DC
Start: 2020-11-08 — End: 2022-08-01

## 2020-11-08 MED ORDER — AMBULATORY NON FORMULARY MEDICATION: 99 refills | Status: DC

## 2020-11-08 MED ORDER — BENZTROPINE MESYLATE 0.5 MG PO TABS: 0.5000 mg | ORAL_TABLET | Freq: Two times a day (BID) | ORAL | 1 refills | Status: DC

## 2020-11-08 MED ORDER — HYDROXYZINE HCL 25 MG PO TABS: 25.0000 mg | ORAL_TABLET | Freq: Three times a day (TID) | ORAL | 0 refills | Status: DC | PRN

## 2020-11-08 MED ORDER — TRAZODONE HCL 50 MG PO TABS: 50.0000 mg | ORAL_TABLET | Freq: Every evening | ORAL | 1 refills | Status: DC | PRN

## 2020-11-08 NOTE — Patient Instructions (Addendum)
Plan: Referral to Psychiatry to manage medications Refills on medications sent to pharmacy: 30 days w/ 1 refill  Stay on daily medications as-is Take Hydroxyzine as needed for anxiety Take Trazodone as needed for insomnia ^These medications might cause increased drowsiness Referral to Neurology to monitor brain cyst For light sensitivity: polarized lenses in sunlight, FL-41 lenses inside

## 2020-11-08 NOTE — Progress Notes (Signed)
Cody Roberts is a 25 y.o. male who presents to  [facility]  today, 11/08/20, seeking care for the following:  Hospital follow-up - notes reviewed from hospitalization, see appendix below. Iniitally presented to behavioral health w/ hallucinations. Hospitalized. Imaging revealed temporal lobe lesion concerning for possible contributing factor. Dx: 1) Schizophrenia spectrum disorder with psychotic disorder type not yet determined --> currently stable on Cogentin, Haldol, Tegretol. No appt yet w/ psychiatry. Pt reports AM grogginess but otherwise doing better. No further hallucinations. 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       ASSESSMENT & PLAN with other pertinent findings:  The primary encounter diagnosis was Schizophreniform disorder ([place]). Diagnoses of Arachnoid cyst and Photosensitivity were also pertinent to this visit.    Patient Instructions  Plan: Referral to Psychiatry to manage medications Refills on medications sent to pharmacy: 30 days w/ 1 refill  Stay on daily medications as-is Take Hydroxyzine as needed for anxiety Take Trazodone as needed for insomnia ^These medications might cause increased drowsiness Referral to Neurology to monitor brain cyst For light sensitivity: polarized lenses in sunlight, FL-41 lenses inside    Orders Placed This Encounter  Procedures  . Ambulatory referral to Psychiatry  . Ambulatory referral to Neurology    Meds ordered this encounter  Medications  . DISCONTD: AMBULATORY NON FORMULARY MEDICATION    Sig: Medication Name: Eyeglasses with FL-41 lenses Dx photosensitivity L56.8    Dispense:  1 Units    Refill:  prn  . benztropine (COGENTIN) 0.5 MG tablet    Sig: Take 1 tablet (0.5 mg total) by mouth 2 (two) times daily.    Dispense:  60 tablet    Refill:  1  . carbamazepine  (TEGRETOL) 100 MG chewable tablet    Sig: Chew 2 tablets (200 mg total) by mouth 2 (two) times daily.    Dispense:  60 tablet    Refill:  1  . haloperidol (HALDOL) 5 MG tablet    Sig: Take 1 tablet (5 mg total) by mouth 3 (three) times daily.    Dispense:  90 tablet    Refill:  1  . hydrOXYzine (ATARAX/VISTARIL) 25 MG tablet    Sig: Take 1 tablet (25 mg total) by mouth 3 (three) times daily as needed for anxiety.    Dispense:  30 tablet    Refill:  0  . traZODone (DESYREL) 50 MG tablet    Sig: Take 1 tablet (50 mg total) by mouth at bedtime as needed for sleep.    Dispense:  30 tablet    Refill:  1  . DISCONTD: AMBULATORY NON FORMULARY MEDICATION    Sig: Medication Name: Eyeglasses with POLARIZED lenses Dx photosensitivity L56.8    Dispense:  1 Units    Refill:  prn     See below for relevant physical exam findings  See below for recent lab and imaging results reviewed  Medications, allergies, PMH, PSH, SocH, Hydro reviewed below    Follow-up instructions: Return in about 6 months (around 05/11/2021) for ANNUAL CHECK-UP - SEE [place] SOONER IF NEEDED.                                        Exam:  BP 122/69 (BP Location: Right Arm, Cuff  Size: Normal)   Pulse 77   SpO2 98%   Constitutional: VS see above. General Appearance: alert, well-developed, well-nourished, NAD  Neck: No masses, trachea midline.   Respiratory: Normal respiratory effort. no wheeze, no rhonchi, no rales  Cardiovascular: S1/S2 normal, no murmur, no rub/gallop auscultated. RRR.   Musculoskeletal: Gait normal. Symmetric and independent movement of all extremities  Neurological: Normal balance/coordination. No tremor.  Skin: warm, dry, intact.   Psychiatric: Normal judgment/insight. Normal mood and affect. Oriented x3.   Current Meds  Medication Sig  . [DISCONTINUED] AMBULATORY NON FORMULARY MEDICATION Medication Name: Eyeglasses with FL-41 lenses Dx photosensitivity  L56.8  . [DISCONTINUED] benztropine (COGENTIN) 0.5 MG tablet Take 1 tablet (0.5 mg total) by mouth 2 (two) times daily.  . [DISCONTINUED] carbamazepine (TEGRETOL) 100 MG chewable tablet Chew 2 tablets (200 mg total) by mouth 2 (two) times daily.  . [DISCONTINUED] haloperidol (HALDOL) 5 MG tablet Take 1 tablet (5 mg total) by mouth 3 (three) times daily.  . [DISCONTINUED] hydrOXYzine (ATARAX/VISTARIL) 25 MG tablet Take 1 tablet (25 mg total) by mouth 3 (three) times daily as needed for anxiety.  . [DISCONTINUED] traZODone (DESYREL) 50 MG tablet Take 1 tablet (50 mg total) by mouth at bedtime as needed for sleep.    No Known Allergies  Patient Active Problem List   Diagnosis Date Noted  . Arachnoid cyst 11/05/2020  . Psychotic disorder ([place]) 11/04/2020  . Anxiety 11/04/2020  . Insomnia 11/04/2020  . 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 Schizophrenia spectrum disorder with psychotic disorder type not yet determined ([place]) 11/02/2020  . Marijuana abuse 11/02/2020  . MDD (major depressive disorder), recurrent, severe, with psychosis ([place]) 10/27/2020  . MDD (major depressive disorder), recurrent severe, without psychosis ([place]) 10/24/2020  . Eustachian tube dysfunction, bilateral 04/19/2017  . Dysthymia 04/19/2017  . Chest tightness 04/18/2017  . Sinusitis 05/18/2016  . Vision changes 05/18/2016    History reviewed. No pertinent family history.  Social History   Tobacco Use  Smoking Status Never Smoker  Smokeless Tobacco Never Used    History reviewed. No pertinent surgical history.  Immunization History  Administered Date(s) Administered  . DTaP 07/25/1996, 09/25/1996, 11/27/1996, 06/15/1997, 01/21/2001  . Hepatitis A 05/28/2007, 07/07/2008  . Hepatitis B 1995/10/09, 07/25/1996, 11/27/1996  . HiB (PRP-OMP) 07/25/1996, 10/09/1996, 11/27/1996, 06/15/1997  . IPV 07/25/1996, 09/25/1996, 06/15/1998, 01/21/2001  . Influenza,inj,Quad PF,6+ Mos 04/18/2017, 09/05/2018  .  Influenza-Unspecified 06/26/2005, 05/28/2007, 07/07/2008  . MMR 06/15/1997, 01/21/2001  . Meningococcal Conjugate 05/28/2007  . Td 03/18/2007  . Tdap 03/18/2007, 09/05/2018  . Varicella 06/15/1998, 05/28/2007   Most recent neurology note 11/05/20:  "Patient presented to [facility] on 10/24/2020 secondary to paranoid delusions and auditory hallucinations. Patient presented to [facility] with delusions, hallucinations, poor sleep and bizarre behavior. Patient UDS was positive for [facility] and patient admitted to smoking Delta- 8, synthetic THC. There was concern for Substance induced psychosis vs. Organic psychotic break with recent THC use a confounding factor. Patient was initally trialed on Risperdal and then Zyprexa with no success. Patient was then transitioned to Haldol to which he has responded well. Patient has been able to participate with his EEG LTM. Patient does not continue to endorse delusions or hallucinations and reports that he feels back at his baseline. Patient also reported that he has been sleeping better. Patient endorsed that he had been aware of the arachnoid cyst in his brain since childhood. Patient hx includes documentation of this cyst in 2005. MRI and CT during  this hospitalization suggests that that cyst has grown, but does not appear to be causing edema or significant pressure on the brain. It is likely that the brain and cyst have grown together. Patient EEG LTM has not shown any seizure activity, but did show some slowing in the R hemisphere which is likely 2/2 the arachnoid cyst. Continue Haldol $RemoveBeforeD'5mg'MAQVlAWZXgzrNS$  TID, Tegretol $RemoveBefor'200mg'hFFJXwMFIVHE$  BID, Cogentin 0.$RemoveBeforeD'5mg'LrMRiMPKTCjPHs$  BID. Neurology will sign off. Continue to emphasize that patient not smoke any forms of THC" Recent Results (from the past 2160 hour(s))  CBC with Differential     Status: Abnormal   Collection Time: 10/24/20  5:49 PM  Result Value Ref Range   WBC 6.4 4.0 - 10.5 K/uL   RBC 5.31 4.22 - 5.81 MIL/uL   Hemoglobin 16.1 13.0  - 17.0 g/dL   HCT 42.7 39.0 - 52.0 %   MCV 80.4 80.0 - 100.0 fL   MCH 30.3 26.0 - 34.0 pg   MCHC 37.7 (H) 30.0 - 36.0 g/dL    Comment: ELLIPTOCYTES   RDW 12.5 11.5 - 15.5 %   Platelets 225 150 - 400 K/uL   nRBC 0.0 0.0 - 0.2 %   Neutrophils Relative % 72 %   Neutro Abs 4.6 1.7 - 7.7 K/uL   Lymphocytes Relative 18 %   Lymphs Abs 1.1 0.7 - 4.0 K/uL   Monocytes Relative 10 %   Monocytes Absolute 0.6 0.1 - 1.0 K/uL   Eosinophils Relative 0 %   Eosinophils Absolute 0.0 0.0 - 0.5 K/uL   Basophils Relative 0 %   Basophils Absolute 0.0 0.0 - 0.1 K/uL   Immature Granulocytes 0 %   Abs Immature Granulocytes 0.02 0.00 - 0.07 K/uL   Ovalocytes PRESENT     Comment: Performed at [facility], [address] [place], [place] [address]  Hemoglobin A1c     Status: Abnormal   Collection Time: 10/24/20  5:49 PM  Result Value Ref Range   Hgb A1c MFr Bld 4.3 (L) 4.8 - 5.6 %    Comment: (NOTE) Pre diabetes:          5.7%-6.4%  Diabetes:              >6.4%  Glycemic control for   <7.0% adults with diabetes    Mean Plasma Glucose 76.71 mg/dL    Comment: Performed at [facility] [address], [place], [place] [address]  Basic metabolic panel     Status: Abnormal   Collection Time: 10/24/20  5:49 PM  Result Value Ref Range   Sodium 134 (L) 135 - 145 mmol/L   Potassium 3.6 3.5 - 5.1 mmol/L   Chloride 97 (L) 98 - 111 mmol/L   CO2 24 22 - 32 mmol/L   Glucose, Bld 84 70 - 99 mg/dL    Comment: Glucose reference range applies only to samples taken after fasting for at least 8 hours.   BUN 13 6 - 20 mg/dL   Creatinine, Ser 1.21 0.61 - 1.24 mg/dL   Calcium 10.0 8.9 - 10.3 mg/dL   GFR, Estimated >60 >60 mL/min    Comment: (NOTE) Calculated using the CKD-EPI Creatinine Equation (2021)    Anion gap 13 5 - 15    Comment: Performed at [facility] [address] [place], [place] [address]  TSH     Status: None   Collection Time: 10/24/20  5:49 PM  Result Value Ref Range    TSH 1.422 0.350 - 4.500  uIU/mL    Comment: Performed by a 3rd Generation assay with a functional sensitivity of <=0.01 uIU/mL. Performed at [facility], [place] [address], [place], [place] [address]   Lipid panel     Status: Abnormal   Collection Time: 10/24/20  5:49 PM  Result Value Ref Range   Cholesterol 214 (H) 0 - 200 mg/dL   Triglycerides 57 <150 mg/dL   HDL 57 >40 mg/dL   Total CHOL/HDL Ratio 3.8 RATIO   VLDL 11 0 - 40 mg/dL   LDL Cholesterol 146 (H) 0 - 99 mg/dL    Comment:        Total Cholesterol/HDL:CHD Risk Coronary Heart Disease Risk Table                     Men   Women  1/2 Average Risk   3.4   3.3  Average Risk       5.0   4.4  2 X Average Risk   9.6   7.1  3 X Average Risk  23.4   11.0        Use the calculated Patient Ratio above and the CHD Risk Table to determine the patient's CHD Risk.        ATP III CLASSIFICATION (LDL):  <100     mg/dL   Optimal  100-129  mg/dL   Near or Above                    Optimal  130-159  mg/dL   Borderline  160-189  mg/dL   High  >190     mg/dL   Very High Performed at [facility] [address], [place], [place] [address]   Resp Panel by RT-PCR (Flu A&B, Covid) Nasopharyngeal Swab     Status: None   Collection Time: 10/24/20  5:51 PM   Specimen: Nasopharyngeal Swab; Nasopharyngeal(NP) swabs in vial transport medium  Result Value Ref Range   SARS Coronavirus 2 by RT PCR NEGATIVE NEGATIVE    Comment: (NOTE) SARS-CoV-2 target nucleic acids are NOT DETECTED.  The SARS-CoV-2 RNA is generally detectable in upper respiratory specimens during the acute phase of infection. The lowest concentration of SARS-CoV-2 viral copies this assay can detect is 138 copies/mL. A negative result does not preclude SARS-Cov-2 infection and should not be used as the sole basis for treatment or other patient management decisions. A negative result may occur with  improper specimen collection/handling, submission of specimen  other than nasopharyngeal swab, presence of viral mutation(s) within the areas targeted by this assay, and inadequate number of viral copies(<138 copies/mL). A negative result must be combined with clinical observations, patient history, and epidemiological information. The expected result is Negative.  Fact Sheet for Patients:  EntrepreneurPulse.com.au  Fact Sheet for Healthcare Providers:  IncredibleEmployment.be  This test is no t yet approved or cleared by the [place] FDA and  has been authorized for detection and/or diagnosis of SARS-CoV-2 by FDA under an Emergency Use Authorization (EUA). This EUA will remain  in effect (meaning this test can be used) for the duration of the COVID-19 declaration under Section 564(b)(1) of the Act, 21 U.S.C.section 360bbb-3(b)(1), unless the authorization is terminated  or revoked sooner.       Influenza A by PCR NEGATIVE NEGATIVE   Influenza B by PCR NEGATIVE NEGATIVE    Comment: (NOTE) The Xpert Xpress SARS-CoV-2/FLU/RSV plus assay is intended as an aid in the diagnosis of influenza  from Nasopharyngeal swab specimens and should not be used as a sole basis for treatment. Nasal washings and aspirates are unacceptable for Xpert Xpress SARS-CoV-2/FLU/RSV testing.  Fact Sheet for Patients: EntrepreneurPulse.com.au  Fact Sheet for Healthcare Providers: IncredibleEmployment.be  This test is not yet approved or cleared by the [place] FDA and has been authorized for detection and/or diagnosis of SARS-CoV-2 by FDA under an Emergency Use Authorization (EUA). This EUA will remain in effect (meaning this test can be used) for the duration of the COVID-19 declaration under Section 564(b)(1) of the Act, 21 U.S.C. section 360bbb-3(b)(1), unless the authorization is terminated or revoked.  Performed at [facility], [place] [address], [place], [place] [address]    POC SARS Coronavirus 2 Ag-ED - Nasal Swab     Status: None   Collection Time: 10/24/20  5:51 PM  Result Value Ref Range   SARS Coronavirus 2 Ag Negative Negative  POCT Urine Drug Screen - (ICup)     Status: Abnormal   Collection Time: 10/24/20  5:57 PM  Result Value Ref Range   POC Amphetamine UR None Detected NONE DETECTED (Cut Off Level 1000 ng/mL)   POC Secobarbital (BAR) None Detected NONE DETECTED (Cut Off Level 300 ng/mL)   POC Buprenorphine (BUP) None Detected NONE DETECTED (Cut Off Level 10 ng/mL)   POC Oxazepam (BZO) None Detected NONE DETECTED (Cut Off Level 300 ng/mL)   POC Cocaine UR None Detected NONE DETECTED (Cut Off Level 300 ng/mL)   POC Methamphetamine UR None Detected NONE DETECTED (Cut Off Level 1000 ng/mL)   POC Morphine None Detected NONE DETECTED (Cut Off Level 300 ng/mL)   POC Oxycodone UR None Detected NONE DETECTED (Cut Off Level 100 ng/mL)   POC Methadone UR None Detected NONE DETECTED (Cut Off Level 300 ng/mL)   POC Marijuana UR Positive (A) NONE DETECTED (Cut Off Level 50 ng/mL)  POC SARS Coronavirus 2 Ag     Status: None   Collection Time: 10/24/20  6:07 PM  Result Value Ref Range   SARS Coronavirus 2 Ag NEGATIVE NEGATIVE    Comment: (NOTE) SARS-CoV-2 antigen NOT DETECTED.   Negative results are presumptive.  Negative results do not preclude SARS-CoV-2 infection and should not be used as the sole basis for treatment or other patient management decisions, including infection  control decisions, particularly in the presence of clinical signs and  symptoms consistent with COVID-19, or in those who have been in contact with the virus.  Negative results must be combined with clinical observations, patient history, and epidemiological information. The expected result is Negative.  Fact Sheet for Patients: HandmadeRecipes.com.cy  Fact Sheet for Healthcare Providers: FuneralLife.at  This test is not yet  approved or cleared by the [place] FDA and  has been authorized for detection and/or diagnosis of SARS-CoV-2 by FDA under an Emergency Use Authorization (EUA).  This EUA will remain in effect (meaning this test can be used) for the duration of  the COV ID-19 declaration under Section 564(b)(1) of the Act, 21 U.S.C. section 360bbb-3(b)(1), unless the authorization is terminated or revoked sooner.    Resp Panel by RT-PCR (Flu A&B, Covid) Nasopharyngeal Swab     Status: None   Collection Time: 10/26/20 10:41 PM   Specimen: Nasopharyngeal Swab; Nasopharyngeal(NP) swabs in vial transport medium  Result Value Ref Range   SARS Coronavirus 2 by RT PCR NEGATIVE NEGATIVE    Comment: (NOTE) SARS-CoV-2 target nucleic acids are NOT DETECTED.  The SARS-CoV-2 RNA is generally  detectable in upper respiratory specimens during the acute phase of infection. The lowest concentration of SARS-CoV-2 viral copies this assay can detect is 138 copies/mL. A negative result does not preclude SARS-Cov-2 infection and should not be used as the sole basis for treatment or other patient management decisions. A negative result may occur with  improper specimen collection/handling, submission of specimen other than nasopharyngeal swab, presence of viral mutation(s) within the areas targeted by this assay, and inadequate number of viral copies(<138 copies/mL). A negative result must be combined with clinical observations, patient history, and epidemiological information. The expected result is Negative.  Fact Sheet for Patients:  EntrepreneurPulse.com.au  Fact Sheet for Healthcare Providers:  IncredibleEmployment.be  This test is no t yet approved or cleared by the [place] FDA and  has been authorized for detection and/or diagnosis of SARS-CoV-2 by FDA under an Emergency Use Authorization (EUA). This EUA will remain  in effect (meaning this test can be used) for the  duration of the COVID-19 declaration under Section 564(b)(1) of the Act, 21 U.S.C.section 360bbb-3(b)(1), unless the authorization is terminated  or revoked sooner.       Influenza A by PCR NEGATIVE NEGATIVE   Influenza B by PCR NEGATIVE NEGATIVE    Comment: (NOTE) The Xpert Xpress SARS-CoV-2/FLU/RSV plus assay is intended as an aid in the diagnosis of influenza from Nasopharyngeal swab specimens and should not be used as a sole basis for treatment. Nasal washings and aspirates are unacceptable for Xpert Xpress SARS-CoV-2/FLU/RSV testing.  Fact Sheet for Patients: EntrepreneurPulse.com.au  Fact Sheet for Healthcare Providers: IncredibleEmployment.be  This test is not yet approved or cleared by the [place] FDA and has been authorized for detection and/or diagnosis of SARS-CoV-2 by FDA under an Emergency Use Authorization (EUA). This EUA will remain in effect (meaning this test can be used) for the duration of the COVID-19 declaration under Section 564(b)(1) of the Act, 21 U.S.C. section 360bbb-3(b)(1), unless the authorization is terminated or revoked.  Performed at [facility], [place] [address] [place], [place] [address]   POC SARS Coronavirus 2 Ag-ED - Nasal Swab     Status: None (Preliminary result)   Collection Time: 10/26/20 10:41 PM  Result Value Ref Range   SARS Coronavirus 2 Ag Negative Negative  POC SARS Coronavirus 2 Ag     Status: None   Collection Time: 10/26/20 10:55 PM  Result Value Ref Range   SARS Coronavirus 2 Ag NEGATIVE NEGATIVE    Comment: (NOTE) SARS-CoV-2 antigen NOT DETECTED.   Negative results are presumptive.  Negative results do not preclude SARS-CoV-2 infection and should not be used as the sole basis for treatment or other patient management decisions, including infection  control decisions, particularly in the presence of clinical signs and  symptoms consistent with COVID-19, or in those who  have been in contact with the virus.  Negative results must be combined with clinical observations, patient history, and epidemiological information. The expected result is Negative.  Fact Sheet for Patients: HandmadeRecipes.com.cy  Fact Sheet for Healthcare Providers: FuneralLife.at  This test is not yet approved or cleared by the [place] FDA and  has been authorized for detection and/or diagnosis of SARS-CoV-2 by FDA under an Emergency Use Authorization (EUA).  This EUA will remain in effect (meaning this test can be used) for the duration of  the COV ID-19 declaration under Section 564(b)(1) of the Act, 21 U.S.C. section 360bbb-3(b)(1), unless the authorization is terminated or revoked sooner.  POC SARS Coronavirus 2 Ag     Status: None   Collection Time: 11/01/20  6:38 PM  Result Value Ref Range   SARS Coronavirus 2 Ag NEGATIVE NEGATIVE    Comment: (NOTE) SARS-CoV-2 antigen NOT DETECTED.   Negative results are presumptive.  Negative results do not preclude SARS-CoV-2 infection and should not be used as the sole basis for treatment or other patient management decisions, including infection  control decisions, particularly in the presence of clinical signs and  symptoms consistent with COVID-19, or in those who have been in contact with the virus.  Negative results must be combined with clinical observations, patient history, and epidemiological information. The expected result is Negative.  Fact Sheet for Patients: HandmadeRecipes.com.cy  Fact Sheet for Healthcare Providers: FuneralLife.at  This test is not yet approved or cleared by the [place] FDA and  has been authorized for detection and/or diagnosis of SARS-CoV-2 by FDA under an Emergency Use Authorization (EUA).  This EUA will remain in effect (meaning this test can be used) for the duration of  the COV ID-19  declaration under Section 564(b)(1) of the Act, 21 U.S.C. section 360bbb-3(b)(1), unless the authorization is terminated or revoked sooner.    C-reactive protein     Status: None   Collection Time: 11/03/20  6:34 AM  Result Value Ref Range   CRP <0.5 <1.0 mg/dL    Comment: Performed at [facility], [place] [address], [place], [place] [address]  Sedimentation rate     Status: None   Collection Time: 11/03/20  6:34 AM  Result Value Ref Range   Sed Rate 0 0 - 16 mm/hr    Comment: Performed at [facility], [place] [address], [place], [place] [address]  RPR     Status: None   Collection Time: 11/03/20  6:34 AM  Result Value Ref Range   RPR Ser Ql NON REACTIVE NON REACTIVE    Comment: Performed at [facility], [place] [address] [place], [place] [address]  HIV Antibody (routine testing w rflx)     Status: None   Collection Time: 11/03/20  6:34 AM  Result Value Ref Range   HIV Screen 4th Generation wRfx Non Reactive Non Reactive    Comment: Performed at [facility], [place] [address] [place],  [address]  Comprehensive metabolic panel     Status: Abnormal   Collection Time: 11/03/20  6:34 AM  Result Value Ref Range   Sodium 138 135 - 145 mmol/L   Potassium 3.8 3.5 - 5.1 mmol/L   Chloride 100 98 - 111 mmol/L   CO2 27 22 - 32 mmol/L   Glucose, Bld 138 (H) 70 - 99 mg/dL    Comment: Glucose reference range applies only to samples taken after fasting for at least 8 hours.   BUN 16 6 - 20 mg/dL   Creatinine, Ser 1.24 0.61 - 1.24 mg/dL   Calcium 9.4 8.9 - 10.3 mg/dL   Total Protein 7.1 6.5 - 8.1 g/dL   Albumin 4.7 3.5 - 5.0 g/dL   AST 31 15 - 41 U/L   ALT 31 0 - 44 U/L   Alkaline Phosphatase 46 38 - 126 U/L   Total Bilirubin 1.3 (H) 0.3 - 1.2 mg/dL   GFR, Estimated >60 >60 mL/min    Comment: (NOTE) Calculated using the CKD-EPI Creatinine Equation (2021)    Anion gap 11 5 - 15    Comment: Performed at [facility],  [place] [address], [place], [place] [address]  Heavy metals, blood     Status: None   Collection Time: 11/03/20  6:34 AM  Result Value Ref Range   Arsenic <1 0 - 9 ug/L    Comment: (NOTE) This test was developed and its performance characteristics determined by [facility]. It has not been cleared or approved by the Food and Drug Administration.                                Detection Limit = 1    Mercury <1.0 0.0 - 14.9 ug/L    Comment: (NOTE) This test was developed and its performance characteristics determined by [facility]. It has not been cleared or approved by the Food and Drug Administration.                        Environmental Exposure:  <15.0                        Occupational Exposure:                         [employer] - Inorganic Mercury: 15.0                                Detection Limit =  1.0 Performed At: [facility] [place], [place] [id number] Rush Farmer MD YK:[id number]    Lead <1 0 - 4 ug/dL    Comment: (NOTE) Testing performed by [employer]/land [employer].                          Environmental Exposure:                           WHO Recommendation    <20                          Occupational Exposure:                           OSHA Lead Std          40                           BEI                    30                                Detection Limit =  1 This test was developed and its performance characteristics determined by [facility]. It has not been cleared or approved by the Food and Drug Administration.   Vitamin B12     Status: None   Collection Time: 11/03/20  6:34 AM  Result Value Ref Range   Vitamin B-12 335 180 - 914 pg/mL    Comment: (NOTE) This assay is not validated for testing neonatal or myeloproliferative syndrome specimens for Vitamin B12 levels. Performed at [facility], [place] [address], [place], [place] [address]   Ceruloplasmin  Status: None   Collection Time: 11/03/20   6:34 AM  Result Value Ref Range   Ceruloplasmin 16.7 16.0 - 31.0 mg/dL    Comment: (NOTE) Performed At: [facility] [place], [place] [id number] Rush Farmer MD WU:[id number]   ANA w/Reflex if Positive     Status: None   Collection Time: 11/03/20  6:34 AM  Result Value Ref Range   Anti Nuclear Antibody (ANA) Negative Negative    Comment: (NOTE) Performed At: [facility] [place], [place] [id number] Rush Farmer MD KV:[id number]   CBC with Differential/Platelet     Status: None   Collection Time: 11/03/20  6:34 AM  Result Value Ref Range   WBC 4.5 4.0 - 10.5 K/uL   RBC 5.24 4.22 - 5.81 MIL/uL   Hemoglobin 15.5 13.0 - 17.0 g/dL   HCT 45.7 39.0 - 52.0 %   MCV 87.2 80.0 - 100.0 fL   MCH 29.6 26.0 - 34.0 pg   MCHC 33.9 30.0 - 36.0 g/dL   RDW 12.6 11.5 - 15.5 %   Platelets 184 150 - 400 K/uL   nRBC 0.0 0.0 - 0.2 %   Neutrophils Relative % 61 %   Neutro Abs 2.7 1.7 - 7.7 K/uL   Lymphocytes Relative 31 %   Lymphs Abs 1.4 0.7 - 4.0 K/uL   Monocytes Relative 7 %   Monocytes Absolute 0.3 0.1 - 1.0 K/uL   Eosinophils Relative 1 %   Eosinophils Absolute 0.0 0.0 - 0.5 K/uL   Basophils Relative 0 %   Basophils Absolute 0.0 0.0 - 0.1 K/uL   Immature Granulocytes 0 %   Abs Immature Granulocytes 0.01 0.00 - 0.07 K/uL    Comment: Performed at [facility], [place] [address], [place], [place] [address]  Carbamazepine level, total     Status: None   Collection Time: 11/03/20  6:34 AM  Result Value Ref Range   Carbamazepine Lvl 5.7 4.0 - 12.0 ug/mL    Comment: Performed at [place] [address], [place],  [address]    Overnight EEG with video  Result Date: 11/05/2020 Lora Havens, MD     11/05/2020  3:49 PM Patient Name: DELRON COMER MRN: [id number] Epilepsy Attending: Lora Havens Referring Physician/Provider: Dr. Donnetta Simpers Duration: 11/04/2020 1028 to 11/05/2020 1115  Patient history:  25 year old male who presented with delusions and was found to have a right temporal ecchymosis.  EEG to evaluate for seizures.  Level of alertness: Awake, asleep  AEDs during EEG study: Carbamazepine  Technical aspects: This EEG study was done with scalp electrodes positioned according to the 10-20 International system of electrode placement. Electrical activity was acquired at a sampling rate of $Remov'500Hz'JbOCpi$  and reviewed with a high frequency filter of $RemoveB'70Hz'xnENfwtF$  and a low frequency filter of $RemoveB'1Hz'xesGbYSx$ . EEG data were recorded continuously and digitally stored.  Description: The posterior dominant rhythm consists of 8-9 Hz activity of moderate voltage (25-35 uV) seen predominantly in posterior head regions, symmetric and reactive to eye opening and eye closing.  EEG also showed intermittent generalized and lateralized right hemisphere 4 to 5 Hz theta slowing. Hyperventilation and photic stimulation were not performed.    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  No seizures or epileptiform discharges were seen throughout the recording.  Priyanka  Barbra Sarks       All questions at time of visit were answered - patient instructed to contact office with any additional concerns or updates. ER/RTC precautions were reviewed with the patient as applicable.   Please note: manual typing as well as voice recognition software may have been used to produce this document - typos may escape review. Please contact Dr. Sheppard Coil for any needed clarifications.

## 2020-12-01 ENCOUNTER — Ambulatory Visit (HOSPITAL_COMMUNITY)
Admission: EM | Admit: 2020-12-01 | Discharge: 2020-12-01 | Disposition: A | Discharge status: Home / Self Care | Payer: BC Managed Care – PPO | Attending: Psychiatry | Admitting: Psychiatry

## 2020-12-01 ENCOUNTER — Other Ambulatory Visit: Payer: Self-pay

## 2020-12-01 DIAGNOSIS — F29 Unspecified psychosis not due to a substance or known physiological condition: Secondary | ICD-10-CM | POA: Diagnosis not present

## 2020-12-01 MED ORDER — QUETIAPINE FUMARATE ER 200 MG PO TB24: 400.0000 mg | ORAL_TABLET | Freq: Every day | ORAL | 0 refills | Status: DC

## 2020-12-01 NOTE — Progress Notes (Signed)
Patient received AVS and discharge information reviewed.

## 2020-12-01 NOTE — Discharge Instructions (Signed)

## 2020-12-01 NOTE — ED Provider Notes (Signed)
[facility]al Health Urgent Care Medical Screening Exam  Patient Name: Cody Roberts MRN: [id number] Date of Evaluation: 12/01/20 Chief Complaint:   Diagnosis:  Final diagnoses:  Schizophrenia spectrum disorder with psychotic disorder type not yet determined (HCC)    History of Present illness: Cody Roberts is a 25 y.o. 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 On chart review, patient was admitted to [facility] 10/27/20-11/03/20 after presenting with paranoia. Work up for first psychosis revealed a temporal lobe lesion on CT and he was transferred to neurology for workup; further imaging revealed that it is likely a cyst and work up was largely negative. Patient was discharged on tegretol 200 mg BID, cogentin 0.5 mg BID, haldol 5 mg TID. After discharge [facility]. Pt and mother state that his provider stopped haldol last Thursday due to tremors and started him on gabapentin 100 mg TID and propranolol. Pt states that after this medication was stopped the voices resumed and he felt paranoid; he states that last night he heard the leaves rustling, was hearing whispers and had to sleep with the lights on. Pt states that he believes that his provider at [facility] did not think he needed the haldol in order to treat the paranoia; unclear if this is the case, but pt was not changed to another antipsychotic to treat psychotic sx. Pt states that he feels the paranoia is somewhat better today although feels "suspicious" of everyone but states that he feels like he can trust his family. He denies SI/HI/VH. Patient and mother feel safe for pt to discharge home . Pt states that after discharging from the hospital he used delta 8 on one occurrence on March 26 but has not used it since.   On chart review, in the hospital he was  placed on zyprexa and risperdal with little effect during inpatient hospitalization. Discussed starting another antipsychotic in place of haldol that was stopped, pt and pt's mother amenable. Mother states that they will be going to [place] over the weekend and feel that it would be helpful. Discussed r/b/se/ae of seroquel as this antipsychotic has a significantly lower likelihood of causing movement disorders. She states that they have a virtual appointment with provider on Monday after they called today and reported what was occurring. Mother states that they also have an appointment in May with a [place] psychiatrist; patient and mother feel that this would be beneficial so that "everything can be in one place". Discussed with patient and mother that if symptoms worsen and/or there is concern for pt safety or safety of others to present to local area hospital for evaluation. Pt and mother verbalize understanding. 14 day Rx for seroquel XR 400 mg qhs sent to pharmacy of choice.  Psychiatric Specialty Exam  Presentation  General Appearance:Appropriate for Environment; Casual  Eye Contact:Good  Speech:Clear and Coherent; Normal Rate  Speech Volume:Normal  Handedness:Right   Mood and Affect  Mood:Euthymic  Affect:Appropriate; Constricted   Thought Process  Thought Processes:Coherent; Goal Directed; Linear  Descriptions of Associations:Intact  Orientation:Full (Time, Place and Person)  Thought Content:Logical (denies current paranoia)  Diagnosis of Schizophrenia or Schizoaffective disorder in past: Yes  Duration of Psychotic Symptoms: Greater than six months  Hallucinations:Auditory "incoherent whispers"  Ideas of Reference:None  Suicidal Thoughts:No   Homicidal Thoughts:No   Sensorium  Memory:Immediate Good; Recent Good; Remote Fair  Judgment:Fair  Insight:Fair   Executive Functions  Concentration:Fair  Attention Span:Fair  Recall:Fair  Fund of  Knowledge:Fair  Language:Good   Psychomotor Activity  Psychomotor Activity:Normal   Assets  Assets:Communication Skills; Desire for Improvement; Resilience   Sleep  Sleep:Fair  Number of hours: 0   No data recorded  Physical Exam: Physical Exam Constitutional:      Appearance: Normal appearance. He is normal weight.  HENT:     Head: Normocephalic and atraumatic.  Eyes:     Extraocular Movements: Extraocular movements intact.  Pulmonary:     Effort: Pulmonary effort is normal.  Neurological:     Mental Status: He is alert.    Review of Systems  Constitutional: Negative for chills and fever.  HENT: Negative for hearing loss.   Respiratory: Negative for cough.   Cardiovascular: Negative for chest pain.  Gastrointestinal: Negative for abdominal pain.  Musculoskeletal: Negative for myalgias.  Psychiatric/Behavioral: Negative for depression and suicidal ideas.   Blood pressure (!) 142/100, pulse 82, temperature 98 F (36.7 C), temperature source Oral, resp. rate 18, height 5\' 6"  (1.676 m), weight 59 kg, SpO2 100 %. Body mass index is 20.98 kg/m.  Musculoskeletal: Strength & Muscle Tone: within normal limits Gait & Station: normal Patient leans: N/A   [facility] MSE Discharge Disposition for Follow up and Recommendations: Based on my evaluation the patient does not appear to have an emergency medical condition and can be discharged with resources and 14 day prescription for 400 mg seorquel XR qhs. Recommended to continue other psychiatric medications for now and to follow up with outpatient psychiatrist. Patient has follow up with regular outpatient psychiatrist on Monday.   Patient is instructed prior to discharge to: Take all medications as prescribed by his/her mental healthcare provider. Report any adverse effects and or reactions from the medicines to his/her outpatient provider promptly. Patient has been instructed & cautioned: To not engage in alcohol and or  illegal drug use while on prescription medicines. In the event of worsening symptoms, patient is instructed to call the crisis hotline, 911 and or go to the nearest ED for appropriate evaluation and treatment of symptoms. To follow-up with his/her primary care provider for your other medical issues, concerns and or health care needs.     Tuesday, MD 12/01/2020, 3:27 PM

## 2020-12-01 NOTE — Progress Notes (Signed)
TTS Triage: PT to [facility] voluntarily due to [facility] of whispering voices and hearing movement outside his house. Pt reports paranoid thought of people watching him all the time. Receives medication management at [facility]. Pt reports the doctor recently stopped Haldol medications due to TD last Thursday and that's when AH started. Pt reports compliance with meds and has a supportive family. Pt and mom does not want pt to be admitted today due to a family trip this weekend. Both feel safe with pt returning home. Pt admitted to [facility] 10/2020. Pt denies SI, HI, VH and substance use.  Pt is routine.

## 2020-12-09 ENCOUNTER — Other Ambulatory Visit: Payer: Self-pay | Admitting: Osteopathic Medicine

## 2020-12-29 ENCOUNTER — Telehealth (INDEPENDENT_AMBULATORY_CARE_PROVIDER_SITE_OTHER): Payer: BC Managed Care – PPO | Admitting: Psychiatry

## 2020-12-29 ENCOUNTER — Encounter (HOSPITAL_COMMUNITY): Payer: Self-pay | Admitting: Psychiatry

## 2020-12-29 DIAGNOSIS — F1210 Cannabis abuse, uncomplicated: Secondary | ICD-10-CM

## 2020-12-29 DIAGNOSIS — F200 Paranoid schizophrenia: Secondary | ICD-10-CM

## 2020-12-29 DIAGNOSIS — F29 Unspecified psychosis not due to a substance or known physiological condition: Secondary | ICD-10-CM

## 2020-12-29 DIAGNOSIS — F333 Major depressive disorder, recurrent, severe with psychotic symptoms: Secondary | ICD-10-CM

## 2020-12-29 NOTE — Progress Notes (Signed)
Psychiatric Initial Adult Assessment   Patient Identification: Cody Roberts MRN:  [id number] Date of Evaluation:  12/29/2020 Referral Source: [facility] Discharge Chief Complaint:  psychosis , paranoia Visit Diagnosis:    ICD-10-CM   1. MDD (major depressive disorder), recurrent, severe, with psychosis (HCC)  F33.3   2. Schizophrenia spectrum disorder with psychotic disorder type not yet determined (HCC)  F29   3. Paranoid schizophrenia (HCC)  F20.0   4. Marijuana abuse  F12.10    Virtual Visit via Video Note  I connected with Cody Roberts on 12/29/20 at 10:50 AM by a video enabled telemedicine application and verified that I am speaking with the correct person using two identifiers.  Location: Patient: home Provider: home office   I discussed the limitations of evaluation and management by telemedicine and the availability of in person appointments. The patient expressed understanding and agreed to proceed.        I discussed the assessment and treatment plan with the patient. The patient was provided an opportunity to ask questions and all were answered. The patient agreed with the plan and demonstrated an understanding of the instructions.   The patient was advised to call back or seek an in-person evaluation if the symptoms worsen or if the condition fails to improve as anticipated.  I provided 45  minutes of non-face-to-face time during this encounter including chart review and documentation   History of Present Illness: Patient is a 25 years old currently white single male currently living with his grandmother referred after admission and evaluation at [facility].  He was discharged from the hospital March 24 see notes below as recorded  "Patient seen and examined, admitted earlier this morning by Dr. Loney Loh, briefly [name] is a pleasant 25 year old male with past history of depression and anxiety presented to [facility] and was admitted 3/17, originally presented to [facility] on 3/6 with  paranoid delusions and auditory hallucinations along with worsening depression, he also had a recent history of smoking synthetic marijuana, he was started on Haldol and Tegretol with good effect, his psychosis started to clear he also had a CT head which needed noted on right temple lesion suspicious for an arachnoid cyst, psych MD consulted neurology and there was concern whether this might be contributing to his new onset psychosis and behavioral problems, neurology recommended admission for further work-up including EEG, MRI and serologies"  Patient states he was admitted for paranoia and delusions as a people are watching him they were accused that remind him of he is being watched.  At that time he also was using delta 8 and he has taken excessive amount he feels it may have contributed to the psychosis including paranoia and delusions and also auditory hallucinations that brought him to be evaluated  At discharge he was doing fairly well on Haldol and Cogentin but he was having tremors he has already followed up with [facility] and has seen Dr. Sherril Cong he also has a therapy appointment pending.  He has already had a med check evaluation and has a follow-up with Dr. Excell Seltzer June 6.  His Seroquel was tapered off and started on respite all he is doing fair on Risperdal does not endorse delusions still somewhat paranoia mostly stays at home and withdrawn but not hallucinating.  He endorses depression has gotten better at the time of admission he was feeling down hopeless feeling withdrawn and paranoia was making his depression worse He was also having tremors on Haldol with which he was  discharged for which reason it was changed now he is on respite all does not endorse tremors and no involuntary movements noticeable   He is currently on Tegretol 200 twice a day respite all to be increased to 2 mg at night that is helping him sleep he is not taking the trazodone or the hydroxyzine  He is  following up with neurology and primary care physician for his history of arachnoid cyst , see chart  And regarding his depression he feels better with not feeling hopeless he is not using the T HC 8 and feels less paranoid  He does have family history of schizophrenia and has been currently diagnosed schizophreniform with possibility of schizophrenia or substance-induced psychosis  Does not endorse any clear manic symptoms currently or in the past  Does endorse worries and excessive worries at times in the past mostly and also social anxiety that he was going through CBT  Prior to this admission there is no past psychiatric admission or feeling of hopelessness to the point of suicidal ideations  He has stopped using alcohol 2 years ago  Aggravating factors; recent admission, difficult childhood being a loner Modifying factors mom, grandmother      Past Psychiatric History: depression, anxiety  Previous Psychotropic Medications: Yes   Substance Abuse History in the last 12 months:  Yes.    Consequences of Substance Abuse: significant use of Delta 8. discussed its effect on paranoia, depression and judjemtn  Past Medical History:  Past Medical History:  Diagnosis Date  . Chest tightness 04/18/2017  . Dysthymia 04/19/2017   Referral downstairs to behavioral health for counseling, patient is advised on mental health safety precautions, can come to me if he would like to discuss meds  . Eustachian tube dysfunction, bilateral 04/19/2017   Canals clear and tympanic membranes normal, advised OTC Flonase and or second generation antihistamine Claritin/Zyrtec/Allegra or similar generic  . Sinusitis 05/18/2016  . Vision changes 05/18/2016   History reviewed. No pertinent surgical history.  Family Psychiatric History: undiagnosed bipolar according to him. Mom side 2 cousins have schizophrenia  Family History: History reviewed. No pertinent family history.  Social History:   Social  History   Socioeconomic History  . Marital status: Married    Spouse name: Not on file  . Number of children: Not on file  . Years of education: Not on file  . Highest education level: Not on file  Occupational History  . Not on file  Tobacco Use  . Smoking status: Never Smoker  . Smokeless tobacco: Never Used  Vaping Use  . Vaping Use: Every day  . Substances: THC  Substance and Sexual Activity  . Alcohol use: No  . Drug use: Yes    Types: Marijuana    Comment: Delta 8  . Sexual activity: Not Currently  Other Topics Concern  . Not on file  Social History Narrative  . Not on file   Social Determinants of Health   Financial Resource Strain: Not on file  Food Insecurity: Not on file  Transportation Needs: Not on file  Physical Activity: Not on file  Stress: Not on file  Social Connections: Not on file    Additional Social History: grew up with mom and oldest of 5 siblings. 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  Allergies:  No Known Allergies  Metabolic Disorder Labs: Lab Results  Component Value Date  HGBA1C 4.3 (L) 10/24/2020   MPG 76.71 10/24/2020   No results found for: PROLACTIN Lab Results  Component Value Date   CHOL 214 (H) 10/24/2020   TRIG 57 10/24/2020   HDL 57 10/24/2020   CHOLHDL 3.8 10/24/2020   VLDL 11 10/24/2020   LDLCALC 146 (H) 10/24/2020   Lab Results  Component Value Date   TSH 1.422 10/24/2020    Therapeutic Level Labs: No results found for: LITHIUM Lab Results  Component Value Date   CBMZ 5.7 11/03/2020   No results found for: VALPROATE  Current Medications: Current Outpatient Medications  Medication Sig Dispense Refill  . carbamazepine (TEGRETOL) 100 MG chewable tablet Chew 2 tablets (200 mg total) by mouth 2 (two) times daily. 60 tablet 1  . gabapentin (NEURONTIN) 100 MG capsule Take 100 mg by mouth 3 (three) times  daily.    . risperiDONE (RISPERDAL) 2 MG tablet Take by mouth.    . traZODone (DESYREL) 50 MG tablet TAKE 1 TABLET BY MOUTH AT BEDTIME AS NEEDED FOR SLEEP. 90 tablet 1   No current facility-administered medications for this visit.     Psychiatric Specialty Exam: Review of Systems  Cardiovascular: Negative for chest pain.  Neurological: Negative for tremors.  Psychiatric/Behavioral: Negative for agitation and self-injury.    There were no vitals taken for this visit.There is no height or weight on file to calculate BMI.  General Appearance: Casual  Eye Contact:  Fair  Speech:  Clear and Coherent  Volume:  Decreased  Mood:  Euthymic  Affect:  Constricted  Thought Process:  Goal Directed  Orientation:  Full (Time, Place, and Person)  Thought Content:  Paranoid Ideation and Rumination  Suicidal Thoughts:  No  Homicidal Thoughts:  No  Memory:  Immediate;   Fair Recent;   Fair  Judgement:  Fair  Insight:  Shallow  Psychomotor Activity:  Decreased  Concentration:  Concentration: Fair and Attention Span: Fair  Recall:  Fair  Fund of Knowledge:Good  Language: Good  Akathisia:  No  Handed:   AIMS (if indicated): no involuntary movements  Assets:  Desire for Improvement  ADL's:  Intact  Cognition: WNL  Sleep:  Fair   Screenings: AIMS   Flowsheet Row [facility]  AIMS Total Score 0    AUDIT   Flowsheet Row Ad[facility] ([facility]  Alcohol Use Disorder Identification Test Final Score (AUDIT) 0    GAD-7   Flowsheet Row [facility] [place] [facility] from [facility] [place] [facility] [place]  Total GAD-7 Score 13 13 17     PHQ2-9   Flowsheet Row Video Visit from 12/29/2020 in  [facility] [place] [facility] [place] [facility] Office [facility] from [facility] [place] [facility] [place] [facility] [place] [facility]  PHQ-2 Total Score 1 6 6 6 5   PHQ-9 Total Score -- 22 22 23 11     Flowsheet Row Video Visit from 12/29/2020 in [facility] [place] Admission (Discharged) [facility]  C-SSRS RISK CATEGORY No Risk Moderate Risk Low Risk      Assessment and Plan: as follows Schizophreniform disorder; doing fair does not endorse hallucinations some paranoia but feels the medication is helping and this will be increased to 2 mg as he has been tapering off the Seroquel  Rule out schizophrenia considering he has family history of schizophrenia he understands to continue taking the medication and avoid any substance use if not using any delta 8 since hospital admission feels less paranoid does not endorse hallucinations  Generalized anxiety disorder; anxiety has improved he is not on Prozac he feels anxiety may be relevant to his substance use and paranoia he is, follow-up with therapy scheduled in June  [facility] use; he is avoiding delta 8 use and understands the risk  He has already seen psychiatrist and is following with [facility] has an appointment he will follow-up with them he understands to follow-up with 1 psychiatrist and also has a therapist at the wound center He was not aware that he should not be seeing to psychiatrist or could have had canceled this appointment but he wanted to continue and review assessment today  Questions addressed reviewed medications he does have refills he will follow-up with mood  center in June  No medication adjustment done today and he does have refills of medications  Thresa Ross, MD 5/11/202211:28 AM

## 2021-01-12 ENCOUNTER — Other Ambulatory Visit: Payer: Self-pay | Admitting: Osteopathic Medicine

## 2021-01-12 NOTE — Telephone Encounter (Signed)
[facility] pharmacy requesting med refill for benztropine. Written by [facility].

## 2021-01-14 ENCOUNTER — Ambulatory Visit (INDEPENDENT_AMBULATORY_CARE_PROVIDER_SITE_OTHER): Payer: BC Managed Care – PPO | Admitting: Diagnostic Neuroimaging

## 2021-01-14 ENCOUNTER — Encounter: Payer: Self-pay | Admitting: Diagnostic Neuroimaging

## 2021-01-14 VITALS — BP 124/84 | HR 75 | Ht 66.0 in | Wt 153.0 lb

## 2021-01-14 DIAGNOSIS — G930 Cerebral cysts: Secondary | ICD-10-CM

## 2021-01-14 NOTE — Progress Notes (Signed)
[facility]  PATIENT: Cody Roberts DOB: [birth year]  REFERRING CLINICIAN: Zannie Cove, MD HISTORY FROM: patient  REASON FOR VISIT: new consult    HISTORICAL  CHIEF COMPLAINT:  Chief Complaint  Patient presents with  . New Patient (Initial Visit)    RM 7, w/ grandmother Tonna Boehringer, hospital follow up for arachnoid cyst.     HISTORY OF PRESENT ILLNESS:   25 year old male here for evaluation of arachnoid cyst.  History of major depressive disorder, schizophrenia spectrum disorder, paranoid schizophrenia, cannabis abuse.  Patient presented to the hospital 11/04/2020 for paranoid delusions and auditory hallucinations with worsening depression.  In the course of hospitalization and work-up, CT of the head demonstrated a slightly enlarging arachnoid cyst compared to 2005.  This was followed up with MRI of the brain.  EEG was done as well and seizures were ruled out.  Patient referred to neurology outpatient follow-up of arachnoid cyst.  Denies any seizures or headaches.  Overall feels stable.    REVIEW OF SYSTEMS: Full 14 system review of systems performed and negative with exception of: As per HPI.  ALLERGIES: No Known Allergies  HOME MEDICATIONS: Outpatient Medications Prior to Visit  Medication Sig Dispense Refill  . carbamazepine (TEGRETOL) 100 MG chewable tablet Chew 2 tablets (200 mg total) by mouth 2 (two) times daily. 60 tablet 1  . gabapentin (NEURONTIN) 100 MG capsule Take 100 mg by mouth 3 (three) times daily.    . risperiDONE (RISPERDAL) 2 MG tablet Take by mouth.    . traZODone (DESYREL) 50 MG tablet TAKE 1 TABLET BY MOUTH AT BEDTIME AS NEEDED FOR SLEEP. 90 tablet 1   No facility-administered medications prior to visit.    PAST MEDICAL HISTORY: Past Medical History:  Diagnosis Date  . Chest tightness 04/18/2017  . Dysthymia 04/19/2017   Referral downstairs to behavioral health for counseling, patient is advised on mental health safety  precautions, can come to me if he would like to discuss meds  . Eustachian tube dysfunction, bilateral 04/19/2017   Canals clear and tympanic membranes normal, advised OTC Flonase and or second generation antihistamine Claritin/Zyrtec/Allegra or similar generic  . Sinusitis 05/18/2016  . Vision changes 05/18/2016    PAST SURGICAL HISTORY: History reviewed. No pertinent surgical history.  FAMILY HISTORY: Family History  Problem Relation Age of Onset  . Obesity Maternal Grandmother     SOCIAL HISTORY: Social History   Socioeconomic History  . Marital status: Married    Spouse name: Not on file  . Number of children: Not on file  . Years of education: Not on file  . Highest education level: Not on file  Occupational History  . Not on file  Tobacco Use  . Smoking status: Never Smoker  . Smokeless tobacco: Never Used  Vaping Use  . Vaping Use: Every day  . Substances: THC  Substance and Sexual Activity  . Alcohol use: No  . Drug use: Yes    Types: Marijuana    Comment: Delta 8  . Sexual activity: Not Currently  Other Topics Concern  . Not on file  Social History Narrative   Lives with grandmother   Left handed   Caffeine- soda: 6-7 a day   Social Determinants of Health   Financial Resource Strain: Not on file  Food Insecurity: Not on file  Transportation Needs: Not on file  Physical Activity: Not on file  Stress: Not on file  Social Connections: Not on file  Intimate Partner Violence: Not on file  PHYSICAL EXAM  GENERAL EXAM/CONSTITUTIONAL: Vitals:  Vitals:   01/14/21 1109  BP: 124/84  Pulse: 75  Weight: 153 lb (69.4 kg)  Height: 5\' 6"  (1.676 m)   Body mass index is 24.69 kg/m. Wt Readings from Last 3 Encounters:  01/14/21 153 lb (69.4 kg)  11/05/18 147 lb 12.8 oz (67 kg)  10/07/18 152 lb 6.4 oz (69.1 kg)    Patient is in no distress; well developed, nourished and groomed; neck is supple  CARDIOVASCULAR:  Examination of carotid arteries is  normal; no carotid bruits  Regular rate and rhythm, no murmurs  Examination of peripheral vascular system by observation and palpation is normal  EYES:  Ophthalmoscopic exam of optic discs and posterior segments is normal; no papilledema or hemorrhages No exam data present  MUSCULOSKELETAL:  Gait, strength, tone, movements noted in Neurologic exam below  NEUROLOGIC: MENTAL STATUS:  No flowsheet data found.  awake, alert, oriented to person, place and time  recent and remote memory intact  normal attention and concentration  language fluent, comprehension intact, naming intact  fund of knowledge appropriate  CRANIAL NERVE:   2nd - no papilledema on fundoscopic exam  2nd, 3rd, 4th, 6th - pupils equal and reactive to light, visual fields full to confrontation, extraocular muscles intact, no nystagmus  5th - facial sensation symmetric  7th - facial strength symmetric  8th - hearing intact  9th - palate elevates symmetrically, uvula midline  11th - shoulder shrug symmetric  12th - tongue protrusion midline  MOTOR:   normal bulk and tone, full strength in the BUE, BLE  SENSORY:   normal and symmetric to light touch, temperature, vibration  COORDINATION:   finger-nose-finger, fine finger movements normal  REFLEXES:   deep tendon reflexes present and symmetric  GAIT/STATION:   narrow based gait     DIAGNOSTIC DATA (LABS, IMAGING, TESTING) - I reviewed patient records, labs, notes, testing and imaging myself where available.  Lab Results  Component Value Date   WBC 4.5 11/03/2020   HGB 15.5 11/03/2020   HCT 45.7 11/03/2020   MCV 87.2 11/03/2020   PLT 184 11/03/2020      Component Value Date/Time   NA 138 11/03/2020 0634   K 3.8 11/03/2020 0634   CL 100 11/03/2020 0634   CO2 27 11/03/2020 0634   GLUCOSE 138 (H) 11/03/2020 0634   BUN 16 11/03/2020 0634   CREATININE 1.24 11/03/2020 0634   CREATININE 1.17 09/05/2018 1617   CALCIUM 9.4  11/03/2020 0634   PROT 7.1 11/03/2020 0634   ALBUMIN 4.7 11/03/2020 0634   AST 31 11/03/2020 0634   ALT 31 11/03/2020 0634   ALKPHOS 46 11/03/2020 0634   BILITOT 1.3 (H) 11/03/2020 0634   GFRNONAA >60 11/03/2020 0634   GFRNONAA 88 09/05/2018 1617   GFRAA 102 09/05/2018 1617   Lab Results  Component Value Date   CHOL 214 (H) 10/24/2020   HDL 57 10/24/2020   LDLCALC 146 (H) 10/24/2020   TRIG 57 10/24/2020   CHOLHDL 3.8 10/24/2020   Lab Results  Component Value Date   HGBA1C 4.3 (L) 10/24/2020   Lab Results  Component Value Date   VITAMINB12 335 11/03/2020   Lab Results  Component Value Date   TSH 1.422 10/24/2020    01/11/04 CT head [I reviewed images myself and agree with interpretation. -VRP]  1. No acute intracranial abnormality.   2. Right middle cranial fossa temporal CSF density lesion measuring 23 x 38 mm medially, consistent with an  arachnoid cyst with adjacent mass effect on the temporal lobe. No hemorrhage.    11/04/20 MRI brain [I reviewed images myself and agree with interpretation. Mild, slow enlargement since 2005 CT. -VRP]  - 4.4 x 3.6 x 3.2 cm arachnoid cyst in the right middle cranial fossa with enlargement since a 2005 head CT report. No edema in the compressed right temporal lobe.    ASSESSMENT AND PLAN  25 y.o. year old male here with incidental arachnoid cyst, with slight enlargement since 2005.  Recommend conservative management.  Dx:  1. Arachnoid cyst      PLAN:  ARACHNOID CYST (incidental finding) - mild change from 2005 to 2022; no need for routine monitoring; may repeat scan if new onset headaches or abnormal spells  Return for return to PCP, pending if symptoms worsen or fail to improve.    Suanne Marker, MD 01/14/2021, 11:21 AM Certified in Neurology, Neurophysiology and Neuroimaging  [facility] [address] [place], [place] [address] [phone]

## 2021-01-14 NOTE — Patient Instructions (Signed)
ARACHNOID CYST (incidental finding) - mild change from 2005 to 2022; no need for routine monitoring; may repeat scan if new onset headaches or abnormal spells

## 2021-01-16 ENCOUNTER — Other Ambulatory Visit: Payer: Self-pay | Admitting: Osteopathic Medicine

## 2021-01-18 NOTE — Telephone Encounter (Signed)
Rx removed from active med list. Pls advise, thanks.

## 2021-01-18 NOTE — Telephone Encounter (Signed)
Pt needs to follow up with psychiatry for this, I am not the prescriber

## 2021-03-25 LAB — LIPID PANEL
Cholesterol: 201 — AB (ref 0–200)
LDl/HDL Ratio: 133
Triglycerides: 179 — AB (ref 40–160)

## 2021-03-25 LAB — CBC AND DIFFERENTIAL
Hemoglobin: 16.5 (ref 13.5–17.5)
Platelets: 177 (ref 150–399)
WBC: 3.5

## 2021-03-25 LAB — BASIC METABOLIC PANEL
Chloride: 99 (ref 99–108)
Creatinine: 1 (ref 0.6–1.3)
Glucose: 82
Potassium: 4.3 (ref 3.4–5.3)
Sodium: 139 (ref 137–147)

## 2021-03-25 LAB — TSH: TSH: 4.16 (ref 0.41–5.90)

## 2021-03-25 LAB — HEPATIC FUNCTION PANEL
ALT: 43 — AB (ref 10–40)
AST: 28 (ref 14–40)
Alkaline Phosphatase: 59 (ref 25–125)

## 2021-03-25 LAB — COMPREHENSIVE METABOLIC PANEL
Calcium: 9.7 (ref 8.7–10.7)
eGFR: 113

## 2021-03-25 LAB — HEMOGLOBIN A1C: Hemoglobin A1C: 4.6

## 2021-05-11 ENCOUNTER — Ambulatory Visit (INDEPENDENT_AMBULATORY_CARE_PROVIDER_SITE_OTHER): Payer: Self-pay | Admitting: Osteopathic Medicine

## 2021-05-11 ENCOUNTER — Encounter: Payer: Self-pay | Admitting: Osteopathic Medicine

## 2021-05-11 VITALS — BP 122/73 | HR 83 | Temp 98.0°F | Ht 66.0 in | Wt 179.0 lb

## 2021-05-11 DIAGNOSIS — F2081 Schizophreniform disorder: Secondary | ICD-10-CM

## 2021-05-11 DIAGNOSIS — L568 Other specified acute skin changes due to ultraviolet radiation: Secondary | ICD-10-CM

## 2021-05-11 DIAGNOSIS — Z23 Encounter for immunization: Secondary | ICD-10-CM

## 2021-05-11 MED ORDER — RISPERIDONE 2 MG PO TABS: ORAL_TABLET | ORAL | Status: DC

## 2021-05-11 MED ORDER — PROPRANOLOL HCL 10 MG PO TABS: ORAL_TABLET | ORAL | Status: DC

## 2021-05-11 NOTE — Patient Instructions (Addendum)
General Preventive Care Most recent routine screening labs: have [facility] fax lab records to su [phone].  Blood pressure goal 130/80 or less.  Tobacco: don't!  Alcohol: responsible moderation is ok for most adults, but with your medications I would recommend no alcohol! If you have concerns about your alcohol intake, please talk to me!  Exercise: as tolerated to reduce risk of cardiovascular disease and diabetes. Strength training will also prevent osteoporosis.  Mental health: need to be following w/ psychiatry if at all possible!  Sexual / Reproductive health: if need for STD testing, or if concerns with libido/pain problems, please let me know! If you need to discuss family planning, please let me know!  Advanced Directive: Living Will and/or Healthcare Power of Attorney recommended for all adults, regardless of age or health.  Vaccines Flu vaccine: for almost everyone, every fall.  Shingles vaccine: after age 37.  Pneumonia vaccines: after age 87. Tetanus booster: every 10 years, due 2030 HPV vaccine: Gardasil up to age 83 to prevent HPV-associated diseases, including certain cancers.  COVID vaccine: STRONGLY RECOMMENDED - FALL BOOSTER FOR OMICRON Cancer screenings  Colon cancer screening: for everyone age 55+ Prostate cancer screening: PSA blood test age 36+ Lung cancer screening: not needed for non-smokers  Infection screenings  HIV: recommended screening at least once age 88-65, more often as needed. Gonorrhea/Chlamydia: screening as needed Hepatitis C: recommended once for everyone age 34-75 TB: certain at-risk populations, or depending on work requirements and/or travel history

## 2021-05-11 NOTE — Progress Notes (Signed)
Cody Roberts is a 25 y.o. 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 Diagnoses of Photosensitivity and Need for influenza vaccination were also pertinent to this visit.    Patient Instructions  General Preventive Care Most recent routine screening labs: have [facility] fax lab records to su [phone].  Blood pressure goal 130/80 or less.  Tobacco: don't!  Alcohol: responsible moderation is ok for most adults, but with your medications I would recommend no alcohol! If you have concerns about your alcohol intake, please talk to me!  Exercise: as tolerated to reduce risk of cardiovascular disease and diabetes. Strength training will also prevent osteoporosis.  Mental health: need to be following w/ psychiatry if at all possible!  Sexual / Reproductive health: if need for STD testing, or if concerns with libido/pain problems, please let me know! If you need to discuss family planning, please let me know!  Advanced Directive: Living Will and/or Healthcare Power of Attorney recommended for all adults, regardless of age or health.  Vaccines Flu vaccine: for almost everyone, every fall.  Shingles vaccine: after age 27.  Pneumonia vaccines: after age 35. Tetanus booster: every 10 years, due 2030 HPV vaccine: Gardasil up to age 77 to prevent HPV-associated diseases, including certain cancers.  COVID vaccine: STRONGLY RECOMMENDED - FALL BOOSTER FOR OMICRON Cancer screenings  Colon cancer screening: for everyone age 67+ Prostate cancer screening: PSA blood test age 79+ Lung cancer screening: not needed for non-smokers  Infection screenings  HIV:  recommended screening at least once age 49-65, more often as needed. Gonorrhea/Chlamydia: screening as needed Hepatitis C: recommended once for everyone age 19-50 TB: certain at-risk populations, or depending on work requirements and/or travel history  Orders Placed This Encounter  Procedures   Flu Vaccine QUAD 79mo+IM (Fluarix, Fluzone & Alfiuria Quad PF)    Meds ordered this encounter  Medications   propranolol (INDERAL) 10 MG tablet    Sig: Take 1 tablet po bid prn shakiness - managed by psychiatry   risperiDONE (RISPERDAL) 2 MG tablet    Sig: 1 mg in AM, 2 mg in PM - managed by psychaitry     See below for relevant physical exam findings  See below for recent lab and imaging results reviewed  Medications, allergies, PMH, PSH, SocH, [place] reviewed below    Follow-up instructions: Return in about 1 year (around 05/11/2022) for Reform (OR SOONER IF NEEDED), MYCHART SET TO ALERT YOU TO CALL TO SCHEDULE.                                        Exam:  BP 122/73   Pulse 83   Temp 98 F (36.7 C)   Ht $R'5\' 6"'Sa$  (1.676 m)   Wt 179 lb (81.2 kg)   SpO2 100%   BMI 28.89 kg/m  Constitutional: VS see above. General Appearance: alert, well-developed, well-nourished, NAD Neck: No masses, trachea midline.  Respiratory: Normal respiratory effort. no wheeze, no rhonchi, no rales Cardiovascular: S1/S2 normal, no murmur, no rub/gallop auscultated. RRR.  Musculoskeletal: Gait normal. Symmetric and independent movement  of all extremities Abdominal: non-tender, non-distended, no appreciable organomegaly, neg Murphy's, BS WNLx4 Neurological: Normal balance/coordination. No tremor. Skin: warm, dry, intact.  Psychiatric: Normal judgment/insight. Normal mood and affect. Oriented x3.   Current Meds  Medication Sig   carbamazepine (TEGRETOL) 100 MG chewable tablet Chew 2 tablets (200 mg total) by mouth 2 (two) times daily.   gabapentin (NEURONTIN)  100 MG capsule Take 100 mg by mouth 3 (three) times daily.   traZODone (DESYREL) 50 MG tablet TAKE 1 TABLET BY MOUTH AT BEDTIME AS NEEDED FOR SLEEP.   [DISCONTINUED] propranolol (INDERAL) 10 MG tablet Take by mouth.   [DISCONTINUED] risperiDONE (RISPERDAL) 2 MG tablet Take by mouth.    No Known Allergies  Patient Active Problem List   Diagnosis Date Noted   Arachnoid cyst 11/05/2020   Psychotic disorder ([place]) 11/04/2020   Anxiety 11/04/2020   Insomnia 11/04/2020   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   Schizophrenia spectrum disorder with psychotic disorder type not yet determined ([place]) 11/02/2020   Marijuana abuse 11/02/2020   MDD (major depressive disorder), recurrent, severe, with psychosis ([place]) 10/27/2020   MDD (major depressive disorder), recurrent severe, without psychosis ([place]) 10/24/2020   Eustachian tube dysfunction, bilateral 04/19/2017   Dysthymia 04/19/2017   Chest tightness 04/18/2017   Sinusitis 05/18/2016   Vision changes 05/18/2016    Family History  Problem Relation Age of Onset   Obesity Maternal Grandmother     Social History   Tobacco Use  Smoking Status Never  Smokeless Tobacco Never    History reviewed. No pertinent surgical history.  Immunization History  Administered Date(s) Administered   DTaP 07/25/1996, 09/25/1996, 11/27/1996, 06/15/1997, 01/21/2001   Hepatitis A 05/28/2007, 07/07/2008   Hepatitis B 04-23-1996, 07/25/1996, 11/27/1996   HiB (PRP-OMP) 07/25/1996, 10/09/1996, 11/27/1996, 06/15/1997   IPV 07/25/1996, 09/25/1996, 06/15/1998, 01/21/2001   Influenza,inj,Quad PF,6+ Mos 04/18/2017, 09/05/2018, 05/11/2021   Influenza-Unspecified 06/26/2005, 05/28/2007, 07/07/2008   MMR 06/15/1997, 01/21/2001   Meningococcal Conjugate 05/28/2007   Td 03/18/2007   Tdap 03/18/2007, 09/05/2018   Varicella 06/15/1998, 05/28/2007    No results found for this or any previous visit (from the past 2160 hour(s)).  No results found.     All questions  at time of visit were answered - patient instructed to contact office with any additional concerns or updates. ER/RTC precautions were reviewed with the patient as applicable.   Please note: manual typing as well as voice recognition software may have been used to produce this document - typos may escape review. Please contact Dr. Sheppard Coil for any needed clarifications.

## 2021-05-14 ENCOUNTER — Other Ambulatory Visit: Payer: Self-pay | Admitting: Osteopathic Medicine

## 2021-05-16 NOTE — Telephone Encounter (Signed)
Routing to covering provider. [facility] requesting med refills for benztropine and haloperidol. Rxs are inactive on current med list.

## 2021-05-17 NOTE — Telephone Encounter (Signed)
I am going to decline these, these are medications for schizophrenia and he has a Therapist, sports.

## 2021-09-23 ENCOUNTER — Ambulatory Visit: Payer: BC Managed Care – PPO | Admitting: Physician Assistant

## 2021-09-29 ENCOUNTER — Encounter: Payer: Self-pay | Admitting: Family Medicine

## 2021-09-29 ENCOUNTER — Ambulatory Visit: Payer: BC Managed Care – PPO | Admitting: Family Medicine

## 2021-09-29 ENCOUNTER — Other Ambulatory Visit: Payer: Self-pay

## 2021-09-29 VITALS — BP 120/90 | HR 80 | Resp 16 | Ht 66.0 in | Wt 186.0 lb

## 2021-09-29 DIAGNOSIS — Z5181 Encounter for therapeutic drug level monitoring: Secondary | ICD-10-CM | POA: Insufficient documentation

## 2021-09-29 DIAGNOSIS — F29 Unspecified psychosis not due to a substance or known physiological condition: Secondary | ICD-10-CM

## 2021-09-29 DIAGNOSIS — E785 Hyperlipidemia, unspecified: Secondary | ICD-10-CM | POA: Diagnosis not present

## 2021-09-29 MED ORDER — HYDROXYZINE HCL 25 MG PO TABS: 25.0000 mg | ORAL_TABLET | Freq: Three times a day (TID) | ORAL | 2 refills | Status: DC | PRN

## 2021-09-29 NOTE — Assessment & Plan Note (Signed)
As he is on a mood stabilizer he would benefit from being screened for prediabetes and diabetes at least yearly or every other year.  We will check an A1c today.

## 2021-09-29 NOTE — Assessment & Plan Note (Signed)
Lan to check fasting lipids today.  That way we can see if this is something that can be managed with some lifestyle changes and monitored or if it needs more aggressive treatment.

## 2021-09-29 NOTE — Assessment & Plan Note (Signed)
Currently plugged in pretty well with behavioral health.  He does need a refill on his Droxia seen which she uses as needed.  Dr. Sheppard Coil was writing this for him before so I did send over a prescription to the pharmacy.

## 2021-09-29 NOTE — Progress Notes (Signed)
Established Patient Office Visit  Subjective:  Patient ID: Cody Roberts, male    DOB: 06/15/1996  Age: 26 y.o. MRN: [id number]  CC:  Chief Complaint  Patient presents with   Establish Care    Transferring care.    Labs    Patient stated he had his cholesterol levels checked at [facility] last summer and was told his cholesterol levels were elevated. Patient would like cholesterol levels checked today.     HPI Cody Roberts presents for cancer care from Dr. Sunnie Nielsen.  His mother is Rometta Emery who is also been a longstanding patient of mine. I also take care of of his grandmother Hattie Perch.  He has a history of schizophrenia and [facility].  He currently sees Joella Prince for medication adjustments and sees Valinda Hoar for therapy.  He reports that he has been on these medications for at least a year though they recently have adjusted his risperidone but just by 1 mg as he unfortunately has been having some exacerbation of his symptoms.  He feels like he is trying to work through it but has been on that for couple of weeks now. In regards to function he is not actively in school or working right now.    Patient stated he had his cholesterol levels checked at [facility] last summer and was told his cholesterol levels were elevated. Patient would like cholesterol levels checked today.  He is not currently engaged in any regular exercise.   Past Medical History:  Diagnosis Date   Chest tightness 04/18/2017   Dysthymia 04/19/2017   Referral downstairs to behavioral health for counseling, patient is advised on mental health safety precautions, can come to me if he would like to discuss meds   Eustachian tube dysfunction, bilateral 04/19/2017   Canals clear and tympanic membranes normal, advised OTC Flonase and or second generation antihistamine Claritin/Zyrtec/Allegra or similar generic   Sinusitis 05/18/2016   Vision changes  05/18/2016    No past surgical history on file.  Family History  Problem Relation Age of Onset   Obesity Maternal Grandmother     Social History   Socioeconomic History   Marital status: Married    Spouse name: Not on file   Number of children: Not on file   Years of education: Not on file   Highest education level: Not on file  Occupational History   Not on file  Tobacco Use   Smoking status: Never   Smokeless tobacco: Never  Vaping Use   Vaping Use: Every day   Substances: THC  Substance and Sexual Activity   Alcohol use: No   Drug use: Yes    Types: Marijuana    Comment: Delta 8   Sexual activity: Not Currently  Other Topics Concern   Not on file  Social History Narrative   Lives with grandmother   Left handed   Caffeine- soda: 6-7 a day   Social Determinants of Health   Financial Resource Strain: Not on file  Food Insecurity: Not on file  Transportation Needs: Not on file  Physical Activity: Not on file  Stress: Not on file  Social Connections: Not on file  Intimate Partner Violence: Not on file    Outpatient Medications Prior to Visit  Medication Sig Dispense Refill   carbamazepine (TEGRETOL) 100 MG chewable tablet Chew 2 tablets (200 mg total) by mouth 2 (two) times daily. 60 tablet 1  gabapentin (NEURONTIN) 100 MG capsule Take 100 mg by mouth 3 (three) times daily.     risperiDONE (RISPERDAL) 2 MG tablet 1 mg in AM, 2 mg in PM - managed by psychaitry (Patient taking differently: 5 mg. Patient takes 5 mg three times a day - managed by psychaitry)     traZODone (DESYREL) 50 MG tablet TAKE 1 TABLET BY MOUTH AT BEDTIME AS NEEDED FOR SLEEP. 90 tablet 1   propranolol (INDERAL) 10 MG tablet Take 1 tablet po bid prn shakiness - managed by psychiatry     No facility-administered medications prior to visit.    No Known Allergies  ROS Review of Systems    Objective:    Physical Exam Constitutional:      Appearance: Normal appearance. He is  well-developed.  HENT:     Head: Normocephalic and atraumatic.  Cardiovascular:     Rate and Rhythm: Normal rate and regular rhythm.     Heart sounds: Normal heart sounds.  Pulmonary:     Effort: Pulmonary effort is normal.     Breath sounds: Normal breath sounds.  Skin:    General: Skin is warm and dry.  Neurological:     Mental Status: He is alert and oriented to person, place, and time. Mental status is at baseline.  Psychiatric:        Behavior: Behavior normal.    BP 120/90    Pulse 80    Resp 16    Ht 5\' 6"  (1.676 m)    Wt 186 lb (84.4 kg)    SpO2 98%    BMI 30.02 kg/m  Wt Readings from Last 3 Encounters:  09/29/21 186 lb (84.4 kg)  05/11/21 179 lb (81.2 kg)  01/14/21 153 lb (69.4 kg)     Health Maintenance Due  Topic Date Due   COVID-19 Vaccine (1) Never done   Hepatitis C Screening  Never done    There are no preventive care reminders to display for this patient.  Lab Results  Component Value Date   TSH 1.422 10/24/2020   Lab Results  Component Value Date   WBC 4.5 11/03/2020   HGB 15.5 11/03/2020   HCT 45.7 11/03/2020   MCV 87.2 11/03/2020   PLT 184 11/03/2020   Lab Results  Component Value Date   NA 138 11/03/2020   K 3.8 11/03/2020   CO2 27 11/03/2020   GLUCOSE 138 (H) 11/03/2020   BUN 16 11/03/2020   CREATININE 1.24 11/03/2020   BILITOT 1.3 (H) 11/03/2020   ALKPHOS 46 11/03/2020   AST 31 11/03/2020   ALT 31 11/03/2020   PROT 7.1 11/03/2020   ALBUMIN 4.7 11/03/2020   CALCIUM 9.4 11/03/2020   ANIONGAP 11 11/03/2020   Lab Results  Component Value Date   CHOL 214 (H) 10/24/2020   Lab Results  Component Value Date   HDL 57 10/24/2020   Lab Results  Component Value Date   LDLCALC 146 (H) 10/24/2020   Lab Results  Component Value Date   TRIG 57 10/24/2020   Lab Results  Component Value Date   CHOLHDL 3.8 10/24/2020   Lab Results  Component Value Date   HGBA1C 4.3 (L) 10/24/2020      Assessment & Plan:   Problem List Items  Addressed This Visit       Other   Schizophrenia spectrum disorder with 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  He does need  a refill on his Droxia seen which she uses as needed.  Dr. Lyn Hollingshead was writing this for him before so I did send over a prescription to the pharmacy.      Medication monitoring encounter    As he is on a mood stabilizer he would benefit from being screened for prediabetes and diabetes at least yearly or every other year.  We will check an A1c today.      Relevant Orders   Lipid Panel w/reflex Direct LDL   Hemoglobin A1c   Hyperlipidemia - Primary    Lan to check fasting lipids today.  That way we can see if this is something that can be managed with some lifestyle changes and monitored or if it needs more aggressive treatment.      Relevant Orders   Lipid Panel w/reflex Direct LDL   Hemoglobin A1c    Meds ordered this encounter  Medications   hydrOXYzine (ATARAX) 25 MG tablet    Sig: Take 1 tablet (25 mg total) by mouth 3 (three) times daily as needed for anxiety.    Dispense:  30 tablet    Refill:  2    Pt will call when needed.    York Spaniel he [facility] an [facility] so encouraged him to get a copy of those to so we can take a look at them.  Follow-up: No follow-ups on file.    Nani Gasser, MD

## 2021-09-30 LAB — HEMOGLOBIN A1C
Hgb A1c MFr Bld: 4.6 % of total Hgb (ref <5.7)
Mean Plasma Glucose: 85 mg/dL
eAG (mmol/L): 4.7 mmol/L

## 2021-09-30 LAB — LIPID PANEL W/REFLEX DIRECT LDL
Cholesterol: 203 mg/dL — ABNORMAL HIGH (ref <200)
HDL: 34 mg/dL — ABNORMAL LOW (ref >=40)
LDL Cholesterol (Calc): 147 mg/dL (calc) — ABNORMAL HIGH
Non-HDL Cholesterol (Calc): 169 mg/dL (calc) — ABNORMAL HIGH (ref <130)
Total CHOL/HDL Ratio: 6 (calc) — ABNORMAL HIGH (ref <5.0)
Triglycerides: 108 mg/dL (ref <150)

## 2021-09-30 NOTE — Progress Notes (Signed)
HI Cody Roberts,  Your total cholesterol and LDL cholesterol are elevated.  LDL cholesterol goal is less than 100 and yours is 147.  This is not high enough that she need to start a cholesterol pill but I would encourage you to just work on healthy diet so eating more vegetables and fruits and less sugars and carbs.  And if you are eating fatty foods make sure there are more healthy fats like for salmon, avocados, nuts, olive oil instead of fried foods and butters and vegetable oils.  Regular exercise helps as well.  I know you mentioned that you were not working out regularly.  So trying to shoot for 30 minutes of fast-paced walking 5 days a week or 25 minutes of high paced exercise such as running 3 days a week.  Your A1c looks great.  No sign of diabetes or prediabetes.

## 2021-10-21 ENCOUNTER — Encounter: Payer: Self-pay | Admitting: Family Medicine

## 2021-10-21 LAB — CARBAMAZEPINE LEVEL, TOTAL: CARBAMAZEPINE, TOTAL: 6

## 2021-10-21 LAB — T4 (THYROXINE), TOTAL (REFL): T4, Total: 6.6

## 2021-12-10 ENCOUNTER — Emergency Department
Admission: EM | Admit: 2021-12-10 | Discharge: 2021-12-10 | Disposition: A | Discharge status: Home / Self Care | Payer: BC Managed Care – PPO | Source: Home / Self Care

## 2021-12-10 ENCOUNTER — Other Ambulatory Visit: Payer: Self-pay

## 2021-12-10 DIAGNOSIS — H6692 Otitis media, unspecified, left ear: Secondary | ICD-10-CM

## 2021-12-10 MED ORDER — AMOXICILLIN-POT CLAVULANATE 875-125 MG PO TABS: 1.0000 | ORAL_TABLET | Freq: Two times a day (BID) | ORAL | 0 refills | Status: AC

## 2021-12-10 NOTE — ED Triage Notes (Signed)
Pt states that he has some ringing and fullness of his left ear. X2 days ?

## 2021-12-10 NOTE — ED Provider Notes (Signed)
?Cody Roberts ? ? ? ?CSN: [id number] ?Arrival date & time: 12/10/21  1418 ? ? ?  ? ?History   ?Chief Complaint ?Chief Complaint  ?Patient presents with  ? Ear Problem  ?  Ringing and fullness of left ear. X2 days  ? ? ?HPI ?Cody Roberts is a 26 y.o. male.  ? ?HPI 26 year old male presents with ringing and fullness of left ear for 2 days.  PMH significant for schizophrenia, insomnia and HLD. ? ?Past Medical History:  ?Diagnosis Date  ? Chest tightness 04/18/2017  ? Dysthymia 04/19/2017  ? Referral downstairs to behavioral health for counseling, patient is advised on mental health safety precautions, can come to me if he would like to discuss meds  ? Eustachian tube dysfunction, bilateral 04/19/2017  ? Canals clear and tympanic membranes normal, advised OTC Flonase and or second generation antihistamine Claritin/Zyrtec/Allegra or similar generic  ? Sinusitis 05/18/2016  ? Vision changes 05/18/2016  ? ? ?Patient Active Problem List  ? Diagnosis Date Noted  ? Hyperlipidemia 09/29/2021  ? Medication monitoring encounter 09/29/2021  ? Arachnoid cyst 11/05/2020  ? Psychotic disorder ([place]) 11/04/2020  ? Anxiety 11/04/2020  ? Insomnia 11/04/2020  ? 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 Schizophrenia spectrum disorder with psychotic disorder type not yet determined ([place]) 11/02/2020  ? Marijuana abuse 11/02/2020  ? 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 MDD (major depressive disorder), recurrent severe, without psychosis ([place]) 10/24/2020  ? Dysthymia 04/19/2017  ? Chest tightness 04/18/2017  ? Vision changes 05/18/2016  ? ? ?History reviewed. No pertinent surgical history. ? ? ? ? ?Home Medications   ? ?Prior to Admission medications   ?Medication Sig Start Date End Date Taking? Authorizing Provider  ?amoxicillin-clavulanate (AUGMENTIN) 875-125 MG tablet Take 1 tablet by mouth 2 (two) times daily for 10 days. 12/10/21 12/20/21 Yes Eliezer Lofts, FNP  ?ARIPiprazole (ABILIFY) 20  MG tablet Take 20 mg by mouth daily. 12/01/21  Yes [provider]  ?carbamazepine (TEGRETOL) 100 MG chewable tablet Chew 2 tablets (200 mg total) by mouth 2 (two) times daily. 11/08/20  Yes Emeterio Reeve, DO  ?gabapentin (NEURONTIN) 100 MG capsule Take 100 mg by mouth 3 (three) times daily. 12/21/20  Yes [provider]  ?hydrOXYzine (ATARAX) 25 MG tablet Take 1 tablet (25 mg total) by mouth 3 (three) times daily as needed for anxiety. 09/29/21  Yes Hali Marry, MD  ?risperiDONE (RISPERDAL) 2 MG tablet 1 mg in AM, 2 mg in PM - managed by psychaitry ?Patient taking differently: 5 mg. Patient takes 5 mg three times a day - managed by psychaitry 05/11/21  Yes Emeterio Reeve, DO  ?traZODone (DESYREL) 50 MG tablet TAKE 1 TABLET BY MOUTH AT BEDTIME AS NEEDED FOR SLEEP. 12/09/20  Yes Emeterio Reeve, DO  ?benztropine (COGENTIN) 0.5 MG tablet Take 1 tablet (0.5 mg total) by mouth 2 (two) times daily. 11/08/20 12/29/20  Emeterio Reeve, DO  ?haloperidol (HALDOL) 5 MG tablet Take 1 tablet (5 mg total) by mouth 3 (three) times daily. 11/08/20 12/29/20  Emeterio Reeve, DO  ?QUEtiapine (SEROQUEL XR) 200 MG 24 hr tablet Take 2 tablets (400 mg total) by mouth at bedtime for 14 days. 12/01/20 12/29/20  Ival Bible, MD  ? ? ?Family History ?Family History  ?Problem Relation Age of Onset  ? Obesity Maternal Grandmother   ? ? ?Social History ?Social History  ? ?Tobacco Use  ? Smoking status: Never  ? Smokeless tobacco: Never  ?Vaping  Use  ? Vaping Use: Every day  ? Substances: THC  ?Substance Use Topics  ? Alcohol use: No  ? Drug use: Yes  ?  Types: Marijuana  ?  Comment: [employer] 8  ? ? ? ?Allergies   ?Patient has no known allergies. ? ? ?Review of Systems ?Review of Systems  ?HENT:  Positive for ear pain.   ?All other systems reviewed and are negative. ? ? ?Physical Exam ?Triage Vital Signs ?ED Triage Vitals [12/10/21 1427]  ?Enc Vitals Group  ?   BP   ?   Pulse   ?   Resp   ?   Temp   ?    Temp src   ?   SpO2   ?   Weight   ?   Height   ?   Head Circumference   ?   Peak Flow   ?   Pain Score 0  ?   Pain Loc   ?   Pain Edu?   ?   Excl. in [place]?   ? ?No data found. ? ?Updated Vital Signs ?BP (!) 152/114 (BP Location: Left Arm)   Pulse 79   Temp 98.5 ?F (36.9 ?C) (Oral)   Resp 20   Ht (!) 6" (0.152 m)   Wt 200 lb (90.7 kg)   SpO2 96%   BMI 3905.98 kg/m?  ? ?  ? ?Physical Exam ?Vitals and nursing note reviewed.  ?Constitutional:   ?   General: He is not in acute distress. ?   Appearance: Normal appearance. He is obese. He is not ill-appearing.  ?HENT:  ?   Head: Normocephalic and atraumatic.  ?   Right Ear: Tympanic membrane, ear canal and external ear normal.  ?   Left Ear: Tympanic membrane, ear canal and external ear normal.  ?   Mouth/Throat:  ?   Mouth: Mucous membranes are dry.  ?   Pharynx: Oropharynx is clear.  ?Eyes:  ?   Extraocular Movements: Extraocular movements intact.  ?   Conjunctiva/sclera: Conjunctivae normal.  ?   Pupils: Pupils are equal, round, and reactive to light.  ?Cardiovascular:  ?   Rate and Rhythm: Normal rate and regular rhythm.  ?   Pulses: Normal pulses.  ?   Heart sounds: Normal heart sounds. No murmur heard. ?Pulmonary:  ?   Effort: Pulmonary effort is normal.  ?   Breath sounds: Normal breath sounds. No wheezing, rhonchi or rales.  ?Musculoskeletal:  ?   Cervical back: Normal range of motion and neck supple.  ?Skin: ?   General: Skin is warm and dry.  ?Neurological:  ?   General: No focal deficit present.  ?   Mental Status: He is alert and oriented to person, place, and time.  ? ? ? ?UC Treatments / Results  ?Labs ?(all labs ordered are listed, but only abnormal results are displayed) ?Labs Reviewed - No data to display ? ?EKG ? ? ?Radiology ?No results found. ? ?Procedures ?Procedures (including critical care time) ? ?Medications Ordered in UC ?Medications - No data to display ? ?Initial Impression / Assessment and Plan / UC Course  ?I have reviewed the triage  vital signs and the nursing notes. ? ?Pertinent labs & imaging results that were available during my care of the patient were reviewed by me and considered in my medical decision making (see chart for details). ? ?  ? ?MDM: 1.  Acute left otitis media-Rx'd Augmentin. Advised patient to take medication  as directed with food to completion.  Encouraged patient to increase daily water intake while taking this medication.  Advised patient if symptoms worsen and/or unresolved please follow-up with PCP or here for further evaluation.  Patient discharged home, hemodynamically stable. ?Final Clinical Impressions(s) / UC Diagnoses  ? ?Final diagnoses:  ?Acute left otitis media  ? ? ? ?Discharge Instructions   ? ?  ?Advised patient to take medication as directed with food to completion.  Encouraged patient to increase daily water intake while taking this medication.  Advised patient if symptoms worsen and/or unresolved please follow-up with PCP or here for further evaluation. ? ? ? ? ?ED Prescriptions   ? ? Medication Sig Dispense Auth. Provider  ? amoxicillin-clavulanate (AUGMENTIN) 875-125 MG tablet Take 1 tablet by mouth 2 (two) times daily for 10 days. 20 tablet Eliezer Lofts, FNP  ? ?  ? ?PDMP not reviewed this encounter. ?  ?Eliezer Lofts, [place] ?12/10/21 1453 ? ?

## 2021-12-10 NOTE — Discharge Instructions (Addendum)
Advised patient to take medication as directed with food to completion.  Encouraged patient to increase daily water intake while taking this medication.  Advised patient if symptoms worsen and/or unresolved please follow-up with PCP or here for further evaluation. 

## 2022-01-21 IMAGING — MR MR HEAD WO/W CM
14 of 16 series · 42 of 48 positions shown · IV contrast (gadavist)
Comparison: Head CT from 2 days ago

CLINICAL DATA: Brain mass or lesion

EXAM:
MRI HEAD WITHOUT AND WITH CONTRAST
TECHNIQUE: Multiplanar, multiecho pulse sequences of the brain and surrounding
structures were obtained without and with intravenous contrast.
CONTRAST:  6mL GADAVIST GADOBUTROL 1 MMOL/ML IV SOLN

[Series 5: DWI · axial · 3.0mm · 0.88mm/px · z∈[-86,+55]mm · 8 of 100 slices shown (1 of 2)]
[im 1/100]
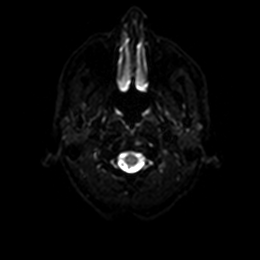
[im 15/100]
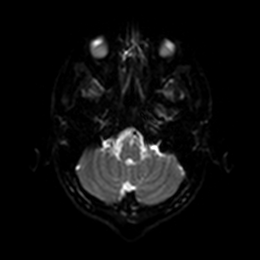
[im 29/100]
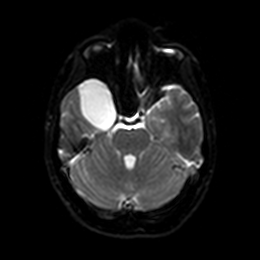
[im 43/100]
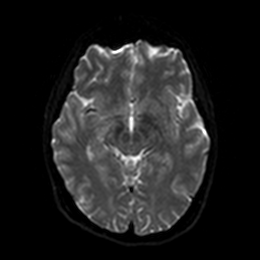
[im 57/100]
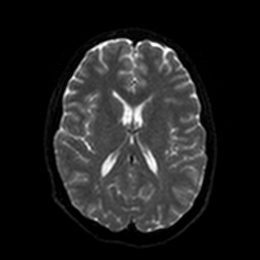
[im 71/100]
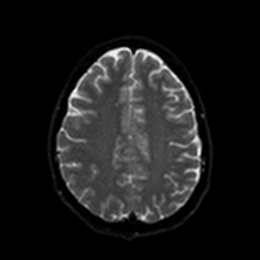
[im 85/100]
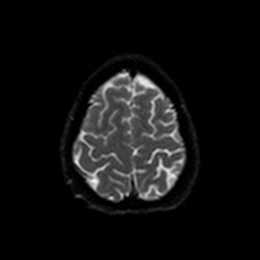
[im 100/100]
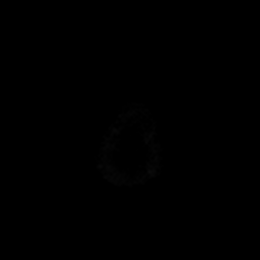

[Series 6: DWI · axial · 3.0mm · 0.88mm/px · z∈[-86,+55]mm · 3 of 50 slices shown (2 of 2)]
[im 1/50]
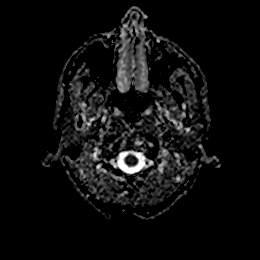
[im 25/50]
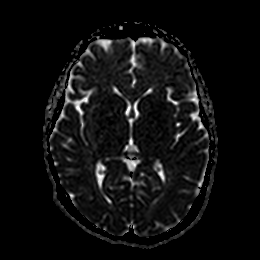
[im 50/50]
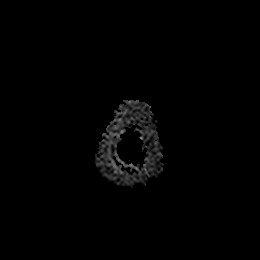

[Series 7: cor dwi_tracew · coronal · 4.0mm · 0.88mm/px · 5 of 72 slices shown]
[im 1/72]
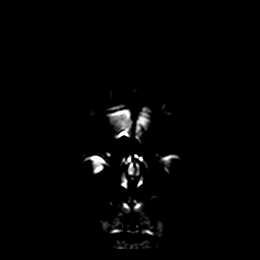
[im 18/72]
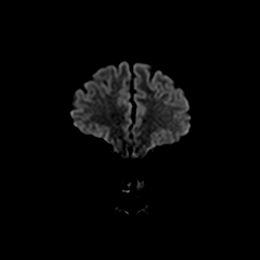
[im 36/72]
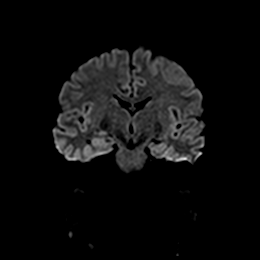
[im 54/72]
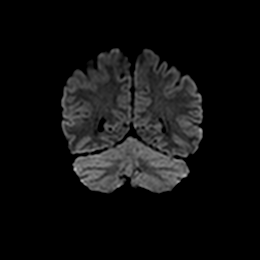
[im 72/72]
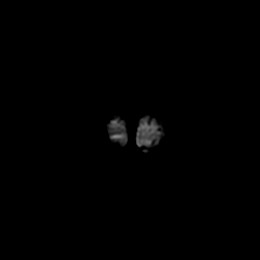

[Series 8: cor dwi_adc · coronal · 4.0mm · 0.88mm/px · 2 of 36 slices shown]
[im 1/36]
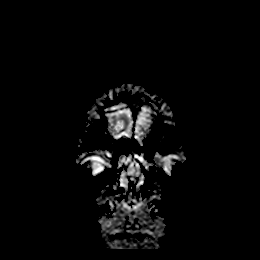
[im 36/36]
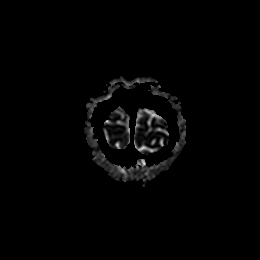

[Series 9: T1 · sagittal · 5.0mm · 0.75mm/px · 2 of 23 slices shown]
[im 1/23]
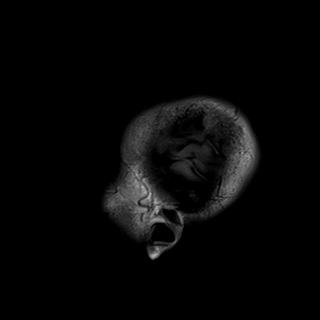
[im 23/23]
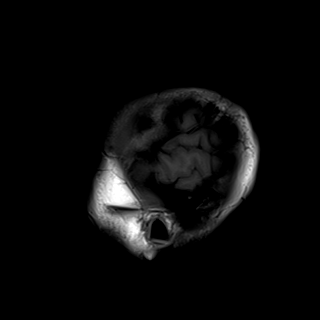

[Series 10: T2 · axial · 5.0mm · 0.72mm/px · z∈[-90,+59]mm · 2 of 27 slices shown]
[im 1/27]
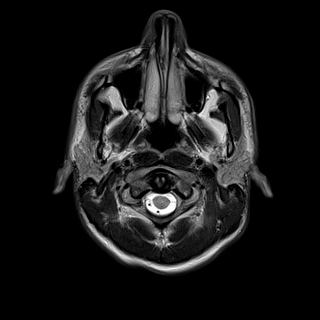
[im 27/27]
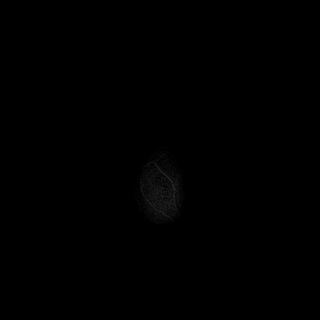

[Series 11: FLAIR · axial · 5.0mm · 0.45mm/px · z∈[-91,+57]mm · 2 of 27 slices shown]
[im 1/27]
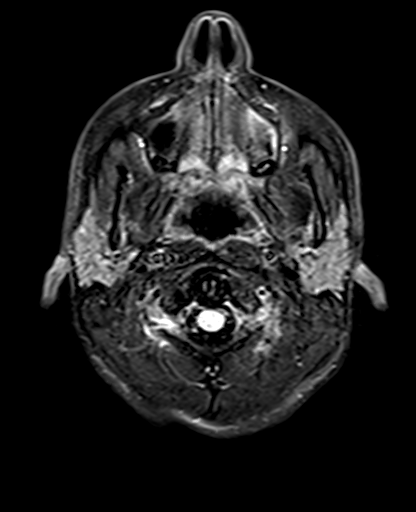
[im 27/27]
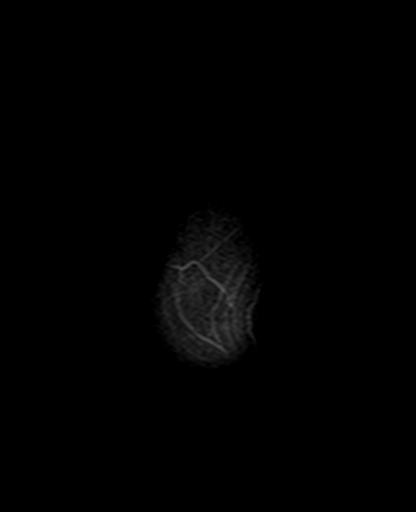

[Series 12: mag_images · axial · 3.0mm · 0.90mm/px · z∈[-90,+56]mm · 3 of 52 slices shown]
[im 1/52]
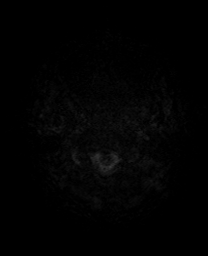
[im 26/52]
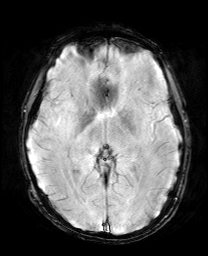
[im 52/52]
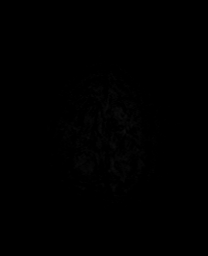

[Series 13: pha_images · axial · 3.0mm · 0.90mm/px · z∈[-87,+56]mm · 3 of 50 slices shown]
[im 1/50]
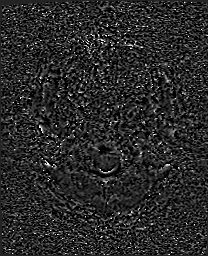
[im 25/50]
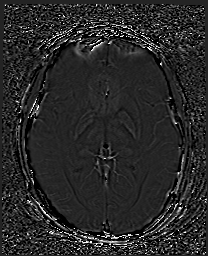
[im 50/50]
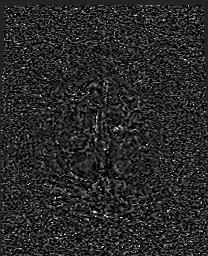

[Series 14: swi_images · axial · 3.0mm · 0.90mm/px · z∈[-90,+56]mm · 3 of 52 slices shown]
[im 1/52]
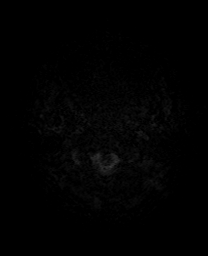
[im 26/52]
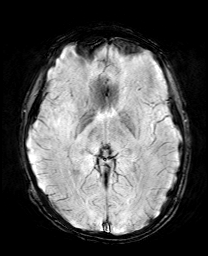
[im 52/52]
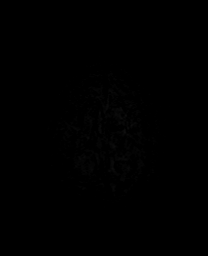

[Series 15: mip_images(sw) · axial · 24.0mm · 0.90mm/px · z∈[-80,+46]mm · 3 of 45 slices shown]
[im 1/45]
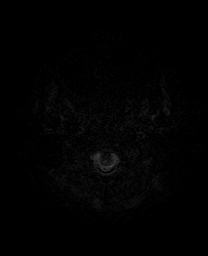
[im 23/45]
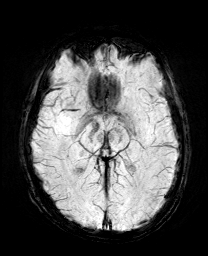
[im 45/45]
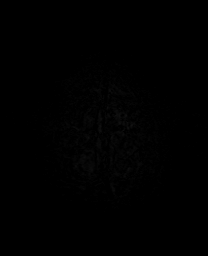

[Series 17: T2 post-contrast · coronal · 5.0mm · 0.72mm/px · 2 of 30 slices shown]
[im 1/30]
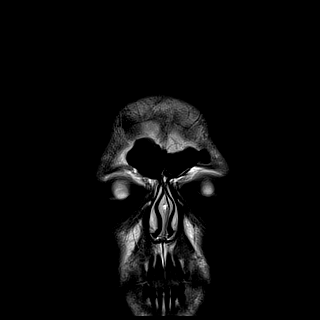
[im 30/30]
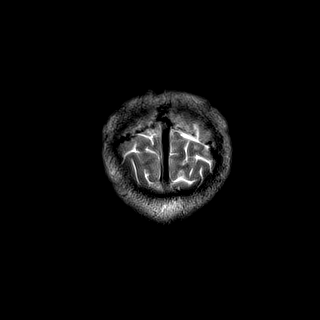

[Series 19: T1 post-contrast · coronal · 5.0mm · 0.34mm/px · 2 of 30 slices shown (1 of 2)]
[im 1/30]
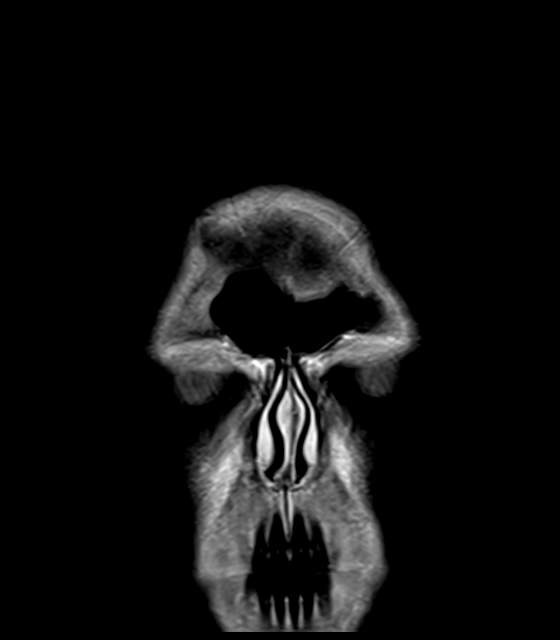
[im 30/30]
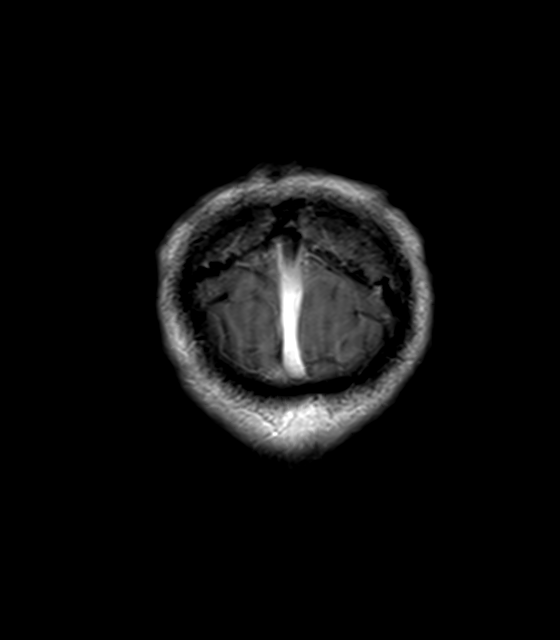

[Series 20: T1 post-contrast · sagittal · 5.0mm · 0.72mm/px · 2 of 23 slices shown (2 of 2)]
[im 1/23]
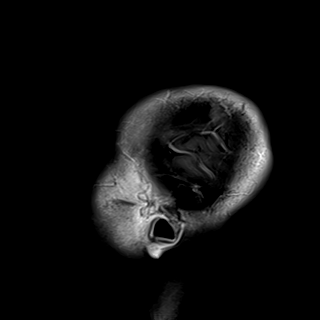
[im 23/23]
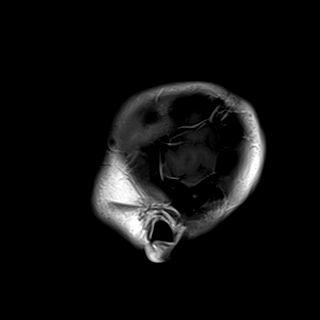

[42 of 48 positions shown; findings below may reference images not displayed]

FINDINGS: Brain: CSF intensity mass at the right middle cranial fossa with
temporal lobe and MCA mass effect, 4.4 x 3.2 x 3.6 cm. No internal
complexity or multiplicity to suggest an infectious cyst. Dimensions
are increased from a 9999 head CT report (23 x 38 mm at that time).
No brain edema or gliosis seen in the adjacent temporal lobe. No
infarct, hydrocephalus, or intra-axial mass. No chronic blood
products.

Vascular: Normal flow voids and vascular enhancements.

Skull and upper cervical spine: Relative expansion of the right
middle cranial fossa related to the long-standing cyst.

Sinuses/Orbits: Negative
IMPRESSION: 4.4 x 3.6 x 3.2 cm arachnoid cyst in the right middle cranial fossa
with enlargement since a 9999 head CT report. No edema in the
compressed right temporal lobe.

## 2022-04-21 DIAGNOSIS — Z419 Encounter for procedure for purposes other than remedying health state, unspecified: Secondary | ICD-10-CM | POA: Diagnosis not present

## 2022-05-21 DIAGNOSIS — Z419 Encounter for procedure for purposes other than remedying health state, unspecified: Secondary | ICD-10-CM | POA: Diagnosis not present

## 2022-06-21 DIAGNOSIS — Z419 Encounter for procedure for purposes other than remedying health state, unspecified: Secondary | ICD-10-CM | POA: Diagnosis not present

## 2022-07-21 DIAGNOSIS — Z419 Encounter for procedure for purposes other than remedying health state, unspecified: Secondary | ICD-10-CM | POA: Diagnosis not present

## 2022-08-01 ENCOUNTER — Ambulatory Visit (INDEPENDENT_AMBULATORY_CARE_PROVIDER_SITE_OTHER): Payer: Medicaid Other | Admitting: Family Medicine

## 2022-08-01 ENCOUNTER — Encounter: Payer: Self-pay | Admitting: Family Medicine

## 2022-08-01 ENCOUNTER — Ambulatory Visit: Payer: BC Managed Care – PPO | Admitting: Family Medicine

## 2022-08-01 VITALS — BP 118/77 | HR 71 | Ht 66.0 in | Wt 194.0 lb

## 2022-08-01 DIAGNOSIS — F29 Unspecified psychosis not due to a substance or known physiological condition: Secondary | ICD-10-CM | POA: Diagnosis not present

## 2022-08-01 DIAGNOSIS — Z23 Encounter for immunization: Secondary | ICD-10-CM

## 2022-08-01 MED ORDER — ARIPIPRAZOLE 20 MG PO TABS: 20.0000 mg | ORAL_TABLET | Freq: Every day | ORAL | 1 refills | Status: DC

## 2022-08-01 MED ORDER — CARBAMAZEPINE 100 MG PO CHEW: 200.0000 mg | CHEWABLE_TABLET | Freq: Two times a day (BID) | ORAL | 1 refills | Status: DC

## 2022-08-01 MED ORDER — GABAPENTIN 100 MG PO CAPS: 200.0000 mg | ORAL_CAPSULE | Freq: Two times a day (BID) | ORAL | 1 refills | Status: DC

## 2022-08-01 NOTE — Progress Notes (Signed)
   Established Patient Office Visit  Subjective   Patient ID: Cody Roberts, male    DOB: 02/26/1996  Age: 26 y.o. MRN: [id number]  Chief Complaint  Patient presents with   Medication Refill    HPI  [facility].  He recently had to come off of his [employer] and so actually qualified for Medicaid after getting approval for disability.  They would no longer take his insurance and so he needs a new referral to psychiatry and will need a continuation of his medications.  He says he ran out about 3 days ago.    ROS    Objective:     BP 118/77 (BP Location: Right Arm, Patient Position: Sitting, Cuff Size: Large)   Pulse 71   Ht 5\' 6"  (1.676 m)   Wt 194 lb (88 kg)   SpO2 97%   BMI 31.31 kg/m    Physical Exam Constitutional:      Appearance: He is well-developed.  HENT:     Head: Normocephalic and atraumatic.  Cardiovascular:     Rate and Rhythm: Normal rate and regular rhythm.     Heart sounds: Normal heart sounds.  Pulmonary:     Effort: Pulmonary effort is normal.     Breath sounds: Normal breath sounds.  Skin:    General: Skin is warm and dry.  Neurological:     Mental Status: He is alert and oriented to person, place, and time.  Psychiatric:        Behavior: Behavior normal.      No results found for any visits on 08/01/22.    The ASCVD Risk score (Arnett DK, et al., 2019) failed to calculate for the following reasons:   The 2019 ASCVD risk score is only valid for ages 46 to 67    Assessment & Plan:   Problem List Items Addressed This Visit       Other   Schizophrenia spectrum disorder with psychotic disorder type not yet determined ([facility])    Been on his current regimen for about 5 to 6 months.  He has had previous hospitalization.  He does feel he still having some mania symptoms including shopping sprees etc.  Will go ahead and refill the medication so that he can get back on it  has been off for about 3 days.  He said he did have recent blood work so hopefully we can get a copy of that blood and place referral to Dr. 76 for medication management.  He does need medication sent to [facility] on [address].      Relevant Medications   ARIPiprazole (ABILIFY) 20 MG tablet   carbamazepine (TEGRETOL) 100 MG chewable tablet   gabapentin (NEURONTIN) 100 MG capsule   Other Relevant Orders   Ambulatory referral to Psychiatry   Other Visit Diagnoses     Need for immunization against influenza    -  Primary   Relevant Orders   Flu Vaccine QUAD 65mo+IM (Fluarix, Fluzone & Alfiuria Quad PF) (Completed)   Encounter for immunization       Relevant Orders   Pfizer Fall 2023 Covid-19 Vaccine 48yrs and older (Completed)       No follow-ups on file.    15yr, MD

## 2022-08-01 NOTE — Assessment & Plan Note (Signed)
Been on his current regimen for about 5 to 6 months.  He has had previous hospitalization.  He does feel he still having some mania symptoms including shopping sprees etc.  Will go ahead and refill the medication so that he can get back on it has been off for about 3 days.  He said he did have recent blood work so hopefully we can get a copy of that blood and place referral to Dr. Gilmore Laroche for medication management.  He does [facility] [address].

## 2022-08-21 DIAGNOSIS — Z419 Encounter for procedure for purposes other than remedying health state, unspecified: Secondary | ICD-10-CM | POA: Diagnosis not present

## 2022-08-24 ENCOUNTER — Other Ambulatory Visit: Payer: Self-pay | Admitting: Family Medicine

## 2022-08-24 DIAGNOSIS — F29 Unspecified psychosis not due to a substance or known physiological condition: Secondary | ICD-10-CM

## 2022-09-01 ENCOUNTER — Encounter: Payer: Self-pay | Admitting: Family Medicine

## 2022-09-01 ENCOUNTER — Ambulatory Visit (INDEPENDENT_AMBULATORY_CARE_PROVIDER_SITE_OTHER): Payer: Medicaid Other | Admitting: Family Medicine

## 2022-09-01 VITALS — BP 115/70 | HR 75 | Ht 66.0 in | Wt 190.0 lb

## 2022-09-01 DIAGNOSIS — F515 Nightmare disorder: Secondary | ICD-10-CM

## 2022-09-01 DIAGNOSIS — F29 Unspecified psychosis not due to a substance or known physiological condition: Secondary | ICD-10-CM

## 2022-09-01 MED ORDER — PRAMIPEXOLE DIHYDROCHLORIDE 0.125 MG PO TABS: 0.1250 mg | ORAL_TABLET | Freq: Every day | ORAL | 0 refills | Status: AC

## 2022-09-01 NOTE — Assessment & Plan Note (Signed)
Continue current regimen he has new consultation with psychiatry in about 2 weeks.  Should have 1 more refill on his medications to carry him through.  We discussed getting up-to-date blood work.  He said he had some blood work done [facility] back in August with [facility] so we will try to get that.

## 2022-09-01 NOTE — Progress Notes (Signed)
   Established Patient Office Visit  Subjective   Patient ID: MINA CARLISI, male    DOB: [birth year]  Age: 27 y.o. MRN: [id number]  No chief complaint on file.   HPI  Follow-up medication regimen for schizophrenia spectrum disorder.  We did refill his medication a month ago when I last saw him.  We also placed a referral to psychiatry and he says that he does have an upcoming appointment.  He feels like he is actually doing well on his current regimen he feels like they are stable he has not noticed any particular concerning side effects.  His only issue right now is he still struggling with some work recurring dreams and PTSD type dreams.  He says they happen almost every night.  They do not usually wake him up out of his sleep.  He previously been given a prescription for prazosin but says it did not really help so eventually just stopped it.  No sleepwalking history recently.  Though he did as a child.    ROS    Objective:     BP 115/70   Pulse 75   Ht 5\' 6"  (1.676 m)   Wt 190 lb (86.2 kg)   SpO2 100%   BMI 30.67 kg/m    Physical Exam Constitutional:      Appearance: He is well-developed.  HENT:     Head: Normocephalic and atraumatic.  Cardiovascular:     Rate and Rhythm: Normal rate and regular rhythm.     Heart sounds: Normal heart sounds.  Pulmonary:     Effort: Pulmonary effort is normal.     Breath sounds: Normal breath sounds.  Skin:    General: Skin is warm and dry.  Neurological:     Mental Status: He is alert and oriented to person, place, and time.  Psychiatric:        Behavior: Behavior normal.      No results found for any visits on 09/01/22.    The ASCVD Risk score (Arnett DK, et al., 2019) failed to calculate for the following reasons:   The 2019 ASCVD risk score is only valid for ages 52 to 85    Assessment & Plan:   Problem List Items Addressed This Visit       Other   Schizophrenia spectrum disorder with psychotic disorder type not  yet determined ([place]) - Primary    Continue current regimen he has new consultation with psychiatry in about 2 weeks.  Should have 1 more refill on his medications to carry him through.  We discussed getting up-to-date blood work.  He said he had some blood work done at the [facility] back in August with [facility] so we will try to get that.      Other Visit Diagnoses     Nightmare disorder       Relevant Medications   pramipexole (MIRAPEX) 0.125 MG tablet       Nightmares-he reports that the prazosin really was not helpful.  Will do a trial of Mirapex 0.125 mg and see if that is helpful if not encouraged him to follow-up with psychiatry for alternative options.  Psychotherapy should also be helpful for these as well.  No follow-ups on file.    Beatrice Lecher, MD

## 2022-09-19 ENCOUNTER — Ambulatory Visit (HOSPITAL_COMMUNITY): Payer: Medicaid Other | Admitting: Psychiatry

## 2022-09-21 DIAGNOSIS — Z419 Encounter for procedure for purposes other than remedying health state, unspecified: Secondary | ICD-10-CM | POA: Diagnosis not present

## 2022-09-29 ENCOUNTER — Other Ambulatory Visit: Payer: Self-pay | Admitting: Family Medicine

## 2022-09-29 DIAGNOSIS — F515 Nightmare disorder: Secondary | ICD-10-CM

## 2022-10-10 ENCOUNTER — Ambulatory Visit (INDEPENDENT_AMBULATORY_CARE_PROVIDER_SITE_OTHER): Payer: Medicaid Other | Admitting: Psychiatry

## 2022-10-10 ENCOUNTER — Encounter (HOSPITAL_COMMUNITY): Payer: Self-pay | Admitting: Psychiatry

## 2022-10-10 VITALS — BP 145/91 | HR 86 | Ht 66.0 in | Wt 187.0 lb

## 2022-10-10 DIAGNOSIS — F333 Major depressive disorder, recurrent, severe with psychotic symptoms: Secondary | ICD-10-CM

## 2022-10-10 DIAGNOSIS — F1210 Cannabis abuse, uncomplicated: Secondary | ICD-10-CM

## 2022-10-10 DIAGNOSIS — F200 Paranoid schizophrenia: Secondary | ICD-10-CM

## 2022-10-10 DIAGNOSIS — F411 Generalized anxiety disorder: Secondary | ICD-10-CM | POA: Diagnosis not present

## 2022-10-10 MED ORDER — ARIPIPRAZOLE 5 MG PO TABS: 5.0000 mg | ORAL_TABLET | Freq: Every day | ORAL | 1 refills | Status: DC
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
## 2022-10-20 DIAGNOSIS — Z419 Encounter for procedure for purposes other than remedying health state, unspecified: Secondary | ICD-10-CM | POA: Diagnosis not present

## 2022-11-04 ENCOUNTER — Other Ambulatory Visit (HOSPITAL_COMMUNITY): Payer: Self-pay | Admitting: Psychiatry

## 2022-11-07 ENCOUNTER — Encounter (HOSPITAL_COMMUNITY): Payer: Self-pay | Admitting: Psychiatry

## 2022-11-07 ENCOUNTER — Ambulatory Visit (INDEPENDENT_AMBULATORY_CARE_PROVIDER_SITE_OTHER): Payer: Medicaid Other | Admitting: Psychiatry

## 2022-11-07 ENCOUNTER — Other Ambulatory Visit: Payer: Self-pay | Admitting: Family Medicine

## 2022-11-07 VITALS — BP 134/84 | HR 86 | Temp 99.2°F | Ht 66.5 in | Wt 187.0 lb

## 2022-11-07 DIAGNOSIS — F411 Generalized anxiety disorder: Secondary | ICD-10-CM

## 2022-11-07 DIAGNOSIS — F1210 Cannabis abuse, uncomplicated: Secondary | ICD-10-CM

## 2022-11-07 DIAGNOSIS — F29 Unspecified psychosis not due to a substance or known physiological condition: Secondary | ICD-10-CM

## 2022-11-07 DIAGNOSIS — F200 Paranoid schizophrenia: Secondary | ICD-10-CM | POA: Diagnosis not present
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
## 2022-11-20 DIAGNOSIS — Z419 Encounter for procedure for purposes other than remedying health state, unspecified: Secondary | ICD-10-CM | POA: Diagnosis not present

## 2022-11-21 ENCOUNTER — Other Ambulatory Visit: Payer: Self-pay

## 2022-11-21 DIAGNOSIS — F29 Unspecified psychosis not due to a substance or known physiological condition: Secondary | ICD-10-CM

## 2022-11-21 MED ORDER — CARBAMAZEPINE 100 MG PO CHEW: 200.0000 mg | CHEWABLE_TABLET | Freq: Two times a day (BID) | ORAL | 0 refills | Status: DC

## 2022-11-21 NOTE — Telephone Encounter (Signed)
Patient advised.

## 2022-11-21 NOTE — Telephone Encounter (Signed)
Cody Roberts's grandmother sent a MyChart message. It is ok to refill carbamazepine?   Cody Roberts  P [facility] Clinical Pool (supporting Hali Marry, MD)47 minutes ago (12:57 PM)    Dr. Madilyn Fireman, I have a problem. Cody Roberts needs a prescription that had previously been prescribed by [name] at  [facility] where he no longer goes. She is not Medicaid, so he can't get it unless you or Dr. De Nurse, downstairs, send a prescription. [place] refuses to  call because the messages say no one fill's prescriptions over the phone. He has an appointment with Dr. De Nurse at the end of April, and has seen him twice but didn't think to ask. . The medication is Carbamazepine (Tegratol) 100 mg chewable tablets. We both [facility] [address] in [place]. Please let me know what to do. Thank you so much.

## 2022-12-18 ENCOUNTER — Other Ambulatory Visit: Payer: Self-pay | Admitting: Family Medicine

## 2022-12-18 DIAGNOSIS — F29 Unspecified psychosis not due to a substance or known physiological condition: Secondary | ICD-10-CM

## 2022-12-18 NOTE — Telephone Encounter (Signed)
Do you feel comfortable taking this over?

## 2022-12-19 ENCOUNTER — Encounter (HOSPITAL_COMMUNITY): Payer: Self-pay | Admitting: Psychiatry

## 2022-12-19 ENCOUNTER — Ambulatory Visit (INDEPENDENT_AMBULATORY_CARE_PROVIDER_SITE_OTHER): Payer: Medicaid Other | Admitting: Psychiatry

## 2022-12-19 VITALS — BP 117/95 | HR 79 | Ht 66.0 in | Wt 192.0 lb

## 2022-12-19 DIAGNOSIS — F411 Generalized anxiety disorder: Secondary | ICD-10-CM | POA: Diagnosis not present

## 2022-12-19 DIAGNOSIS — F333 Major depressive disorder, recurrent, severe with psychotic symptoms: Secondary | ICD-10-CM

## 2022-12-19 DIAGNOSIS — F200 Paranoid schizophrenia: Secondary | ICD-10-CM

## 2022-12-19 DIAGNOSIS — H9190 Unspecified hearing loss, unspecified ear: Secondary | ICD-10-CM

## 2022-12-19 DIAGNOSIS — F250 Schizoaffective disorder, bipolar type: Secondary | ICD-10-CM

## 2022-12-19 MED ORDER — ARIPIPRAZOLE 30 MG PO TABS: 30.0000 mg | ORAL_TABLET | Freq: Every day | ORAL | 0 refills | Status: DC

## 2022-12-19 MED ORDER — BENZTROPINE MESYLATE 0.5 MG PO TABS: 0.5000 mg | ORAL_TABLET | Freq: Every day | ORAL | 0 refills | Status: DC
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
## 2022-12-20 DIAGNOSIS — Z419 Encounter for procedure for purposes other than remedying health state, unspecified: Secondary | ICD-10-CM | POA: Diagnosis not present

## 2023-01-17 ENCOUNTER — Other Ambulatory Visit (HOSPITAL_COMMUNITY): Payer: Self-pay | Admitting: Psychiatry

## 2023-02-13 ENCOUNTER — Ambulatory Visit (HOSPITAL_COMMUNITY): Payer: Medicaid Other | Admitting: Psychiatry

## 2023-03-13 ENCOUNTER — Encounter (HOSPITAL_COMMUNITY): Payer: Self-pay | Admitting: Psychiatry

## 2023-03-13 ENCOUNTER — Ambulatory Visit (HOSPITAL_COMMUNITY): Payer: MEDICAID | Admitting: Psychiatry

## 2023-03-13 VITALS — BP 130/82 | HR 68 | Ht 66.0 in | Wt 196.0 lb

## 2023-03-13 DIAGNOSIS — F200 Paranoid schizophrenia: Secondary | ICD-10-CM | POA: Diagnosis not present

## 2023-03-13 DIAGNOSIS — F1210 Cannabis abuse, uncomplicated: Secondary | ICD-10-CM

## 2023-03-13 DIAGNOSIS — F411 Generalized anxiety disorder: Secondary | ICD-10-CM | POA: Diagnosis not present

## 2023-03-13 DIAGNOSIS — F29 Unspecified psychosis not due to a substance or known physiological condition: Secondary | ICD-10-CM | POA: Diagnosis not present

## 2023-03-13 DIAGNOSIS — F250 Schizoaffective disorder, bipolar type: Secondary | ICD-10-CM

## 2023-03-13 MED ORDER — BENZTROPINE MESYLATE 0.5 MG PO TABS: 0.5000 mg | ORAL_TABLET | Freq: Every day | ORAL | 0 refills | Status: DC

## 2023-03-13 MED ORDER — ARIPIPRAZOLE 30 MG PO TABS: 30.0000 mg | ORAL_TABLET | Freq: Every day | ORAL | 0 refills | Status: DC

## 2023-03-13 MED ORDER — CARBAMAZEPINE 100 MG PO CHEW
200.0000 mg | CHEWABLE_TABLET | Freq: Two times a day (BID) | ORAL | 0 refills | Status: DC
Start: 2023-03-13 — End: 2023-06-04
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
## 2023-05-03 ENCOUNTER — Telehealth (HOSPITAL_COMMUNITY): Payer: Self-pay

## 2023-05-03 ENCOUNTER — Other Ambulatory Visit: Payer: Self-pay | Admitting: Family Medicine

## 2023-05-03 DIAGNOSIS — F29 Unspecified psychosis not due to a substance or known physiological condition: Secondary | ICD-10-CM

## 2023-05-03 NOTE — Telephone Encounter (Signed)
Medication refill - Fax from pt's [facility] [address] for a new Aripiprazole 30 mg order, last provided for 30 days on 03/13/23 and filled on 03/28/23 with no refills. Pt. returns next on 05/15/23.

## 2023-05-04 MED ORDER — ARIPIPRAZOLE 30 MG PO TABS: 30.0000 mg | ORAL_TABLET | Freq: Every day | ORAL | 0 refills | Status: DC

## 2023-05-06 ENCOUNTER — Other Ambulatory Visit: Payer: Self-pay | Admitting: Family Medicine

## 2023-05-09 NOTE — Telephone Encounter (Signed)
Please advise pt that he will need to schedule a f/u appt with Dr. Linford Arnold for a refill of this medication

## 2023-05-09 NOTE — Telephone Encounter (Signed)
Called patient, patient states that he will call office to schedule appt, thanks.

## 2023-05-10 NOTE — Telephone Encounter (Signed)
Called patient, LVM to call office to schedule appt, thanks.

## 2023-05-15 ENCOUNTER — Encounter (HOSPITAL_COMMUNITY): Payer: Self-pay | Admitting: Psychiatry

## 2023-05-15 ENCOUNTER — Ambulatory Visit (INDEPENDENT_AMBULATORY_CARE_PROVIDER_SITE_OTHER): Payer: Medicare Other | Admitting: Psychiatry

## 2023-05-15 VITALS — BP 133/94 | HR 93 | Ht 66.0 in | Wt 192.0 lb

## 2023-05-15 DIAGNOSIS — F200 Paranoid schizophrenia: Secondary | ICD-10-CM | POA: Diagnosis not present

## 2023-05-15 DIAGNOSIS — F411 Generalized anxiety disorder: Secondary | ICD-10-CM

## 2023-05-15 DIAGNOSIS — F250 Schizoaffective disorder, bipolar type: Secondary | ICD-10-CM

## 2023-05-15 DIAGNOSIS — F1210 Cannabis abuse, uncomplicated: Secondary | ICD-10-CM

## 2023-05-15 MED ORDER — HYDROXYZINE HCL 25 MG PO TABS: 25.0000 mg | ORAL_TABLET | Freq: Every day | ORAL | 0 refills | Status: DC | PRN
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
## 2023-05-16 LAB — CBC WITH DIFFERENTIAL/PLATELET
Basophils Absolute: 0 10*3/uL (ref 0.0–0.2)
Basos: 0 %
EOS (ABSOLUTE): 0 10*3/uL (ref 0.0–0.4)
Eos: 0 %
Hematocrit: 49.2 % (ref 37.5–51.0)
Hemoglobin: 17.1 g/dL (ref 13.0–17.7)
Immature Grans (Abs): 0 10*3/uL (ref 0.0–0.1)
Immature Granulocytes: 0 %
Lymphocytes Absolute: 1.7 10*3/uL (ref 0.7–3.1)
Lymphs: 37 %
MCH: 31.4 pg (ref 26.6–33.0)
MCHC: 34.8 g/dL (ref 31.5–35.7)
MCV: 90 fL (ref 79–97)
Monocytes Absolute: 0.4 10*3/uL (ref 0.1–0.9)
Monocytes: 9 %
Neutrophils Absolute: 2.5 10*3/uL (ref 1.4–7.0)
Neutrophils: 54 %
Platelets: 191 10*3/uL (ref 150–450)
RBC: 5.44 x10E6/uL (ref 4.14–5.80)
RDW: 14.3 % (ref 11.6–15.4)
WBC: 4.7 10*3/uL (ref 3.4–10.8)

## 2023-05-16 LAB — CARBAMAZEPINE LEVEL, TOTAL: Carbamazepine (Tegretol), S: 4.2 ug/mL (ref 4.0–12.0)

## 2023-05-22 ENCOUNTER — Ambulatory Visit (HOSPITAL_COMMUNITY): Payer: Medicare Other | Admitting: Licensed Clinical Social Worker

## 2023-05-24 ENCOUNTER — Ambulatory Visit (INDEPENDENT_AMBULATORY_CARE_PROVIDER_SITE_OTHER): Payer: Medicare Other | Admitting: Licensed Clinical Social Worker

## 2023-05-24 ENCOUNTER — Encounter (HOSPITAL_COMMUNITY): Payer: Self-pay

## 2023-05-24 DIAGNOSIS — F411 Generalized anxiety disorder: Secondary | ICD-10-CM | POA: Diagnosis not present

## 2023-05-24 DIAGNOSIS — F250 Schizoaffective disorder, bipolar type: Secondary | ICD-10-CM
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
## 2023-05-29 ENCOUNTER — Other Ambulatory Visit (HOSPITAL_COMMUNITY): Payer: Self-pay | Admitting: Psychiatry

## 2023-05-31 ENCOUNTER — Telehealth (HOSPITAL_COMMUNITY): Payer: Self-pay | Admitting: *Deleted

## 2023-05-31 NOTE — Telephone Encounter (Signed)
Safety Documentation --Done Rx Notified & that script would need to be flagged---  Meets P.A.. Criteria & use CODE 11

## 2023-05-31 NOTE — Telephone Encounter (Signed)
Safety Documentation --Done Rx Notified & that script would need to be flagged---  Meets P.A.. Criteria & use CODE 11  ID# [id number] [id number]

## 2023-06-03 ENCOUNTER — Other Ambulatory Visit (HOSPITAL_COMMUNITY): Payer: Self-pay | Admitting: Psychiatry

## 2023-06-03 DIAGNOSIS — F29 Unspecified psychosis not due to a substance or known physiological condition: Secondary | ICD-10-CM

## 2023-06-04 ENCOUNTER — Telehealth (HOSPITAL_COMMUNITY): Payer: Self-pay | Admitting: *Deleted

## 2023-06-04 NOTE — Telephone Encounter (Signed)
Received Fax after completing P.A. Form 18 years & older with Supprted FDA Docunentation  -- The Member is No Longer Eligible as of 05/01/23 [facility] Pharmacy Benefits

## 2023-06-10 ENCOUNTER — Other Ambulatory Visit (HOSPITAL_COMMUNITY): Payer: Self-pay | Admitting: Psychiatry

## 2023-06-28 ENCOUNTER — Ambulatory Visit (HOSPITAL_COMMUNITY): Payer: Medicare Other | Admitting: Licensed Clinical Social Worker

## 2023-07-11 ENCOUNTER — Ambulatory Visit (INDEPENDENT_AMBULATORY_CARE_PROVIDER_SITE_OTHER): Payer: Medicare Other | Admitting: Licensed Clinical Social Worker

## 2023-07-11 DIAGNOSIS — F250 Schizoaffective disorder, bipolar type: Secondary | ICD-10-CM | POA: Diagnosis not present

## 2023-07-11 DIAGNOSIS — F411 Generalized anxiety disorder: Secondary | ICD-10-CM

## 2023-07-11 NOTE — Progress Notes (Signed)
THERAPIST PROGRESS NOTE  Session Time:2:05 pm-2:45 pm  Type of Therapy: Individual Therapy  Purpose of session/Treatment Goals addressed: Cody Roberts will manage anxiety and delusions as evidenced by living with some the anxiety or delusions without experiencing fear, increase self soothing, identify and challenge anxious/paranoid thoughts for 5 out of 7 days for 60 days.  Interventions: Therapist utilized CBT, Solution focused brief therapy,  and ACT   to address anxiety and mood. Therapist provided support and empathy to patient during session.  Therapist administered the PHQ9 and GAD7 to patient during session. Therapist processed patient's feelings to identify triggers. Therapist provided psychoeducation on CBT to patient and worked with patient on connecting his thoughts, feelings, and behaviors.    Effectiveness: Patient was oriented x4 (person, place, situation, and time) . Patient was  Anxious. Patient was Casually dressed. Patient completed the PHQ9 and GAD7. Patient noted that things have been ok. He was apologetic about missing the last session. He noted his grandmother was out of town but he had the car. The thought of driving [place] [address] and having to merge got him scared and he didn't come. Patient also noted that he went to a [employer]. He noted it was the first time that he has been in a crowd like this in years. He has some anxiety and didn't want to go but followed through. He noted that he was worried about the crowds, sounds, and heights. Patient didn't want to disappoint himself or his step father by not going and that was enough to get him to go. Patient understood CBT and how his thoughts, feelings, and behaviors are connected. Patient recognized how his own thoughts almost made him not go. He is going to pay attention to thoughts, feelings, and behaviors between session.   Patient engaged in session. Patient responded well to interventions. Patient continues to meet  criteria for Schizoaffective disorder, bipolar type (HCC)  GAD (generalized anxiety disorder)  Patient will continue in outpatient therapy due to being the least restrictive service to meet his needs. Patient made minimal progress on his goals at this time.       07/11/2023    2:12 PM 05/24/2023    8:28 PM 10/10/2022   11:10 AM  Depression screen PHQ 2/9  Decreased Interest 1 0 0  Down, Depressed, Hopeless 1 0 1  PHQ - 2 Score 2 0 1  Altered sleeping 3    Tired, decreased energy 2    Change in appetite 2    Feeling bad or failure about yourself  2    Trouble concentrating 0    Moving slowly or fidgety/restless 0    Suicidal thoughts 0    PHQ-9 Score 11         07/11/2023    2:12 PM 09/01/2022    4:28 PM 05/11/2021   11:07 AM 11/05/2018    3:20 PM  GAD 7 : Generalized Anxiety Score  Nervous, Anxious, on Edge 3 3 1 1   Control/stop worrying 2 2 1 2   Worry too much - different things 2 2 1 2   Trouble relaxing 1 1 0 2  Restless 1 1 0 1  Easily annoyed or irritable 1 2 1 3   Afraid - awful might happen 3 2 2 2   Total GAD 7 Score 13 13 6 13   Anxiety Difficulty Extremely difficult Extremely difficult Very difficult Very difficult      Suicidal/Homicidal:  No without intent/plan   Poor interpersonal relationships and support system  and History of psychosis  Protective Factors: responsibility to others (children, family)  Plan: Patient will return in 2-4 weeks.   Diagnosis: Axis I: Schizoaffective disorder, bipolar type (HCC)  GAD (generalized anxiety disorder)    Collaboration of Care: Psychiatrist AEB Dr. Kalman Jewels   Patient/Guardian was advised Release of Information must be obtained prior to any record release in order to collaborate their care with an outside provider. Patient/Guardian was advised if they have not already done so to contact the registration department to sign all necessary forms in order for [place] to release information regarding their care.   Consent:  Patient/Guardian gives verbal consent for treatment and assignment of benefits for services provided during this visit. Patient/Guardian expressed understanding and agreed to proceed.

## 2023-07-17 ENCOUNTER — Ambulatory Visit (INDEPENDENT_AMBULATORY_CARE_PROVIDER_SITE_OTHER): Payer: Medicare Other | Admitting: Psychiatry

## 2023-07-17 ENCOUNTER — Encounter (HOSPITAL_COMMUNITY): Payer: Self-pay | Admitting: Psychiatry

## 2023-07-17 VITALS — BP 128/92 | HR 69 | Ht 66.0 in | Wt 198.0 lb

## 2023-07-17 DIAGNOSIS — F250 Schizoaffective disorder, bipolar type: Secondary | ICD-10-CM | POA: Diagnosis not present

## 2023-07-17 DIAGNOSIS — F411 Generalized anxiety disorder: Secondary | ICD-10-CM | POA: Diagnosis not present

## 2023-07-17 DIAGNOSIS — F1210 Cannabis abuse, uncomplicated: Secondary | ICD-10-CM
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
## 2023-07-25 ENCOUNTER — Ambulatory Visit (INDEPENDENT_AMBULATORY_CARE_PROVIDER_SITE_OTHER): Payer: Medicare Other | Admitting: Licensed Clinical Social Worker

## 2023-07-25 DIAGNOSIS — F250 Schizoaffective disorder, bipolar type: Secondary | ICD-10-CM
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
## 2023-08-08 ENCOUNTER — Ambulatory Visit (HOSPITAL_COMMUNITY): Payer: Medicare Other | Admitting: Licensed Clinical Social Worker

## 2023-08-08 DIAGNOSIS — F250 Schizoaffective disorder, bipolar type: Secondary | ICD-10-CM | POA: Diagnosis not present

## 2023-08-08 NOTE — Progress Notes (Unsigned)
THERAPIST PROGRESS NOTE  Session Time: 2:00 pm-2:45 pm  Type of Therapy: Individual Therapy  Purpose of session/Treatment Goals addressed: Cody Roberts will manage anxiety and delusions as evidenced by living with some the anxiety or delusions without experiencing fear, increase self soothing, identify and challenge anxious/paranoid thoughts for 5 out of 7 days for 60 days.  Interventions: Therapist utilized CBT, Solution focused brief therapy,  and ACT   to address anxiety and mood. Therapist provided support and empathy to patient during session.  Therapist administered the PHQ9 and GAD7 to patient during session. Therapist worked with patient on self soothing through exposure.   Effectiveness: Patient was oriented x4 (person, place, situation, and time) . Patient was  Anxious. Patient was Casually dressed. Patient completed the PHQ9 and GAD7. Patient noted that things have been good. Patient has been playing a Runner, broadcasting/film/video game and has been enjoying it. He noted he has been a little more irritable at times but his mood is overall neutral. Patient has some difficulty falling asleep at times. He has flashbacks of the hospital, and different things in life but these do not trigger anxiety. Patient feels like these are intrusive, repetitive thoughts that delay sleep. Patient noted that he is going to try to be around family on Christmas. He has been in his room the whole time and avoided others but is going to expose himself to the trigger of being around a lot of people at Christmas to increase his tolerance. Patient is going to tolerate it as long as he can and then leave to his room if he needs to.   Patient engaged in session. Patient responded well to interventions. Patient continues to meet criteria for Schizoaffective disorder, bipolar type [facility])  Patient will continue in outpatient therapy due to being the least restrictive service to meet his needs. Patient made minimal progress on his goals  at this time.       07/25/2023    2:04 PM 07/11/2023    2:12 PM 05/24/2023    8:28 PM  Depression screen PHQ 2/9  Decreased Interest 1 1 0  Down, Depressed, Hopeless 1 1 0  PHQ - 2 Score 2 2 0  Altered sleeping 3 3   Tired, decreased energy 1 2   Change in appetite 2 2   Feeling bad or failure about yourself  2 2   Trouble concentrating 3 0   Moving slowly or fidgety/restless 1 0   Suicidal thoughts 0 0   PHQ-9 Score 14 11        07/25/2023    2:05 PM 07/11/2023    2:12 PM 09/01/2022    4:28 PM 05/11/2021   11:07 AM  GAD 7 : Generalized Anxiety Score  Nervous, Anxious, on Edge 3 3 3 1   Control/stop worrying 2 2 2 1   Worry too much - different things 2 2 2 1   Trouble relaxing 1 1 1  0  Restless 2 1 1  0  Easily annoyed or irritable 1 1 2 1   Afraid - awful might happen 3 3 2 2   Total GAD 7 Score 14 13 13 6   Anxiety Difficulty Extremely difficult Extremely difficult Extremely difficult Very difficult      Suicidal/Homicidal:  No without intent/plan   Poor interpersonal relationships and support system and History of psychosis  Protective Factors: responsibility to others (children, family)  Plan: Patient will return in 2-4 weeks.   Diagnosis: Axis I: Schizoaffective disorder, bipolar type [facility])    Collaboration  of Care: Psychiatrist AEB Dr. Kalman Jewels   Patient/Guardian was advised Release of Information must be obtained prior to any record release in order to collaborate their care with an outside provider. Patient/Guardian was advised if they have not already done so to contact the registration department to sign all necessary forms in order for [place] to release information regarding their care.   Consent: Patient/Guardian gives verbal consent for treatment and assignment of benefits for services provided during this visit. Patient/Guardian expressed understanding and agreed to proceed.

## 2023-08-14 ENCOUNTER — Other Ambulatory Visit (HOSPITAL_COMMUNITY): Payer: Self-pay | Admitting: Psychiatry

## 2023-08-28 ENCOUNTER — Ambulatory Visit (INDEPENDENT_AMBULATORY_CARE_PROVIDER_SITE_OTHER): Payer: Medicare Other | Admitting: Licensed Clinical Social Worker

## 2023-08-28 DIAGNOSIS — F250 Schizoaffective disorder, bipolar type: Secondary | ICD-10-CM | POA: Diagnosis not present

## 2023-08-28 NOTE — Progress Notes (Signed)
 THERAPIST PROGRESS NOTE  Session Time:11:00 am-11:45 am  Type of Therapy: Individual Therapy  Purpose of session/Treatment Goals addressed: Tyron will manage anxiety and delusions as evidenced by living with some the anxiety or delusions without experiencing fear, increase self soothing, identify and challenge anxious/paranoid thoughts for 5 out of 7 days for 60 days.  Interventions: Therapist utilized CBT, Solution focused brief therapy,  and ACT   to address anxiety and mood. Therapist provided support and empathy to patient during session.  Therapist administered the PHQ9 and GAD7 to patient during session. Therapist worked with patient on self soothing and challenging paranoid thoughts by changing night routines.    Effectiveness: Patient was oriented x4 (person, place, situation, and time) . Patient was  Anxious. Patient was Casually dressed. Patient completed the PHQ9 and GAD7. Patient noted that he stayed out with his family on Christmas until the very end. He did start to feel overwhelmed and then went to his room. He stayed out longer during Christmas than he normally does. Patient did not stay out during New Year's Day. Patient has been staying up playing video games. His gaming can be 8-12 hours and his sleep can 4-12 hours. Patient agreed to go to bed at midnight and finish playing his last game at 11:00 pm. Patient understood that this one change can help with sleep and having a healthy relationship with video games. He feels like he gets more paranoid playing video games so this change will help things.   Patient engaged in session. Patient responded well to interventions. Patient continues to meet criteria for Schizoaffective disorder, bipolar type [facility])  Patient will continue in outpatient therapy due to being the least restrictive service to meet his needs. Patient made minimal progress on his goals at this time.       08/28/2023   11:05 AM 08/08/2023    2:03 PM 07/25/2023    2:04 PM   Depression screen PHQ 2/9  Decreased Interest 1 1 1   Down, Depressed, Hopeless 1 1 1   PHQ - 2 Score 2 2 2   Altered sleeping 3 3 3   Tired, decreased energy 1 1 1   Change in appetite 2 2 2   Feeling bad or failure about yourself  2 2 2   Trouble concentrating 3 1 3   Moving slowly or fidgety/restless 2 2 1   Suicidal thoughts 0 0 0  PHQ-9 Score 15 13 14        08/28/2023   11:05 AM 08/08/2023    2:03 PM 07/25/2023    2:05 PM 07/11/2023    2:12 PM  GAD 7 : Generalized Anxiety Score  Nervous, Anxious, on Edge 3 3 3 3   Control/stop worrying 2 3 2 2   Worry too much - different things 2 2 2 2   Trouble relaxing 2 2 1 1   Restless 2 1 2 1   Easily annoyed or irritable 1 2 1 1   Afraid - awful might happen 3 3 3 3   Total GAD 7 Score 15 16 14 13   Anxiety Difficulty Extremely difficult Extremely difficult Extremely difficult Extremely difficult      Suicidal/Homicidal:  No without intent/plan   Poor interpersonal relationships and support system and History of psychosis  Protective Factors: responsibility to others (children, family)  Plan: Patient will return in 2-4 weeks.   Diagnosis: Axis I: Schizoaffective disorder, bipolar type (HCC)    Collaboration of Care: Psychiatrist AEB Dr. Jackey Po   Patient/Guardian was advised Release of Information must be obtained prior to any  record release in order to collaborate their care with an outside provider. Patient/Guardian was advised if they have not already done so to contact the registration department to sign all necessary forms in order for us  to release information regarding their care.   Consent: Patient/Guardian gives verbal consent for treatment and assignment of benefits for services provided during this visit. Patient/Guardian expressed understanding and agreed to proceed.

## 2023-09-04 ENCOUNTER — Other Ambulatory Visit: Payer: Self-pay | Admitting: Family Medicine

## 2023-09-04 DIAGNOSIS — F29 Unspecified psychosis not due to a substance or known physiological condition: Secondary | ICD-10-CM

## 2023-09-21 ENCOUNTER — Ambulatory Visit (HOSPITAL_COMMUNITY): Payer: Medicare Other | Admitting: Licensed Clinical Social Worker

## 2023-09-25 ENCOUNTER — Telehealth (HOSPITAL_COMMUNITY): Payer: Self-pay | Admitting: *Deleted

## 2023-09-25 ENCOUNTER — Other Ambulatory Visit (HOSPITAL_COMMUNITY): Payer: Self-pay | Admitting: *Deleted

## 2023-09-25 ENCOUNTER — Ambulatory Visit (INDEPENDENT_AMBULATORY_CARE_PROVIDER_SITE_OTHER): Payer: Medicare Other | Admitting: Licensed Clinical Social Worker

## 2023-09-25 DIAGNOSIS — F250 Schizoaffective disorder, bipolar type: Secondary | ICD-10-CM | POA: Diagnosis not present

## 2023-09-25 DIAGNOSIS — F411 Generalized anxiety disorder: Secondary | ICD-10-CM

## 2023-09-25 MED ORDER — ARIPIPRAZOLE 30 MG PO TABS: 30.0000 mg | ORAL_TABLET | Freq: Every day | ORAL | 0 refills | Status: DC

## 2023-09-25 NOTE — Telephone Encounter (Signed)
Rx REFILL REQUEST -- [id number] - [place], [place] - [address]   ARIPiprazole (ABILIFY) 30 MG tablet  90 tablet   NEXT APPT   10/23/23 LAST APPT  07/17/23

## 2023-09-25 NOTE — Progress Notes (Signed)
 THERAPIST PROGRESS NOTE  Session Time: 2:00 pm-2:45 pm  Type of Therapy: Individual Therapy  Purpose of session/Treatment Goals addressed: Jaray will manage anxiety and delusions as evidenced by living with some the anxiety or delusions without experiencing fear, increase self soothing, identify and challenge anxious/paranoid thoughts for 5 out of 7 days for 60 days.  Interventions: Therapist utilized CBT, Solution focused brief therapy,  and ACT   to address anxiety and mood. Therapist provided support and empathy to patient during session.  Therapist administered the PHQ9 and GAD7 to patient during session. Therapist worked with patient to disrupt behavior and thought patterns to increase self soothing.    Effectiveness: Patient was oriented x4 (person, place, situation, and time) . Patient was  Anxious. Patient was Casually dressed. Patient completed the PHQ9 and GAD7.  Patient noted that his has not been able to regulate his sleep but has stopped playing video games. Patient is going to continue to work on working on his sleep schedule. Patient has been pulling out hair out of his mustache. He feels like his stress level increases around this time of year due to it being close to when everything happened. Patient feels like he is not consciously  doing but when he starts to pull it he will eventually pull it out. Patient also noted that not only does his stress level increase his beard length increases too. Patient was able to identify ways to disrupt the pattern including pet his dog, stand up and stretch his legs, or change hands with his kindle.   Patient engaged in session. Patient responded well to interventions. Patient continues to meet criteria for Schizoaffective disorder, bipolar type (HCC)  GAD (generalized anxiety disorder)  Patient will continue in outpatient therapy due to being the least restrictive service to meet his needs. Patient made minimal progress on his goals at this  time.       09/25/2023    2:06 PM 08/28/2023   11:05 AM 08/08/2023    2:03 PM  Depression screen PHQ 2/9  Decreased Interest 2 1 1   Down, Depressed, Hopeless 1 1 1   PHQ - 2 Score 3 2 2   Altered sleeping 3 3 3   Tired, decreased energy 2 1 1   Change in appetite 2 2 2   Feeling bad or failure about yourself  1 2 2   Trouble concentrating 2 3 1   Moving slowly or fidgety/restless 0 2 2  Suicidal thoughts 0 0 0  PHQ-9 Score 13 15 13        09/25/2023    2:06 PM 08/28/2023   11:05 AM 08/08/2023    2:03 PM 07/25/2023    2:05 PM  GAD 7 : Generalized Anxiety Score  Nervous, Anxious, on Edge 2 3 3 3   Control/stop worrying 1 2 3 2   Worry too much - different things 2 2 2 2   Trouble relaxing 3 2 2 1   Restless 3 2 1 2   Easily annoyed or irritable 2 1 2 1   Afraid - awful might happen 3 3 3 3   Total GAD 7 Score 16 15 16 14   Anxiety Difficulty Extremely difficult Extremely difficult Extremely difficult Extremely difficult      Suicidal/Homicidal:  No without intent/plan   Poor interpersonal relationships and support system and History of psychosis  Protective Factors: responsibility to others (children, family)  Plan: Patient will return in 2-4 weeks.   Diagnosis: Axis I: Schizoaffective disorder, bipolar type (HCC)  GAD (generalized anxiety disorder)    Collaboration of  Care: Psychiatrist AEB Dr. Jackey Po   Patient/Guardian was advised Release of Information must be obtained prior to any record release in order to collaborate their care with an outside provider. Patient/Guardian was advised if they have not already done so to contact the registration department to sign all necessary forms in order for us  to release information regarding their care.   Consent: Patient/Guardian gives verbal consent for treatment and assignment of benefits for services provided during this visit. Patient/Guardian expressed understanding and agreed to proceed.

## 2023-09-25 NOTE — Telephone Encounter (Signed)
REFILL-SENT -- CO SIGN

## 2023-10-11 ENCOUNTER — Ambulatory Visit (HOSPITAL_COMMUNITY): Payer: Medicare Other | Admitting: Licensed Clinical Social Worker

## 2023-10-15 ENCOUNTER — Telehealth (HOSPITAL_COMMUNITY): Payer: Self-pay | Admitting: *Deleted

## 2023-10-15 NOTE — Telephone Encounter (Signed)
 CHECKING MEDICATION LAST FILL DATES RX REFILL REQUEST  benztropine (COGENTIN) 0.5 MG tablet  LAST FILL 03/13/23

## 2023-10-23 ENCOUNTER — Ambulatory Visit (HOSPITAL_COMMUNITY): Payer: Medicare Other | Admitting: Psychiatry

## 2023-10-23 ENCOUNTER — Encounter (HOSPITAL_COMMUNITY): Payer: Self-pay | Admitting: Psychiatry

## 2023-10-23 VITALS — BP 145/98 | HR 91 | Ht 66.0 in | Wt 207.0 lb

## 2023-10-23 DIAGNOSIS — F200 Paranoid schizophrenia: Secondary | ICD-10-CM | POA: Diagnosis not present

## 2023-10-23 DIAGNOSIS — F411 Generalized anxiety disorder: Secondary | ICD-10-CM | POA: Diagnosis not present

## 2023-10-23 DIAGNOSIS — F29 Unspecified psychosis not due to a substance or known physiological condition: Secondary | ICD-10-CM

## 2023-10-23 DIAGNOSIS — F250 Schizoaffective disorder, bipolar type: Secondary | ICD-10-CM

## 2023-10-23 MED ORDER — BENZTROPINE MESYLATE 0.5 MG PO TABS: 0.5000 mg | ORAL_TABLET | Freq: Every day | ORAL | 1 refills | Status: DC
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
## 2023-11-06 ENCOUNTER — Ambulatory Visit (INDEPENDENT_AMBULATORY_CARE_PROVIDER_SITE_OTHER): Admitting: Licensed Clinical Social Worker

## 2023-11-06 ENCOUNTER — Other Ambulatory Visit (HOSPITAL_COMMUNITY): Payer: Self-pay | Admitting: *Deleted

## 2023-11-06 ENCOUNTER — Telehealth (HOSPITAL_COMMUNITY): Payer: Self-pay | Admitting: *Deleted

## 2023-11-06 DIAGNOSIS — F250 Schizoaffective disorder, bipolar type: Secondary | ICD-10-CM

## 2023-11-06 MED ORDER — BENZTROPINE MESYLATE 0.5 MG PO TABS
0.5000 mg | ORAL_TABLET | Freq: Every day | ORAL | 0 refills | Status: DC
Start: 2023-11-06 — End: 2024-02-11

## 2023-11-06 NOTE — Progress Notes (Unsigned)
 THERAPIST PROGRESS NOTE  Session Time: 11:00 am-11:45 am  Type of Therapy: Individual Therapy  Purpose of session/Treatment Goals addressed: Tevyn will manage anxiety and delusions as evidenced by living with some the anxiety or delusions without experiencing fear, increase self soothing, identify and challenge anxious/paranoid thoughts for 5 out of 7 days for 60 days.  Interventions: Therapist utilized CBT, Solution focused brief therapy,  and ACT   to address anxiety and mood. Therapist provided support and empathy to patient during session.  Therapist administered the PHQ9 and GAD7 to patient during session. Therapist worked patient to increase appropriate self soothing when anxiety or paranoia occurs.    Effectiveness: Patient was oriented x4 (person, place, situation, and time) . Patient was  Anxious. Patient was Casually dressed. Patient completed the PHQ9 and GAD7.  Patient noted that he has had up and downs. Patient had a lot of positives during this time as well. Patient has been biting his skin around his nails when he is stressed. Patient explained this is not anything new and has been consistent for year. Patient does this to self soothe. Patient turned on his [employer] and the artist that he was watching on stream before he was hospitalized was on his mix. Patient became scared and paranoid. He noted he regressed for a few days. Patient didn't leave his home or his room. Patient noted that he has been practicing art/drawing daily. This has helped him direct his energy and focus. Patient was frustrated briefly because he wasn't seeing immediate results/improvement. Patient  has stuck with it and has seen improvement. Patient is going to work on getting outside to get exposure to sunlight.   Patient engaged in session. Patient responded well to interventions. Patient continues to meet criteria for Schizoaffective disorder, bipolar type [facility])  Patient will continue in outpatient therapy  due to being the least restrictive service to meet his needs. Patient made minimal progress on his goals at this time.       11/06/2023   11:04 AM 09/25/2023    2:06 PM 08/28/2023   11:05 AM  Depression screen PHQ 2/9  Decreased Interest 1 2 1   Down, Depressed, Hopeless 2 1 1   PHQ - 2 Score 3 3 2   Altered sleeping 3 3 3   Tired, decreased energy 0 2 1  Change in appetite 1 2 2   Feeling bad or failure about yourself  2 1 2   Trouble concentrating 1 2 3   Moving slowly or fidgety/restless 0 0 2  Suicidal thoughts 0 0 0  PHQ-9 Score 10 13 15        11/06/2023   11:06 AM 09/25/2023    2:06 PM 08/28/2023   11:05 AM 08/08/2023    2:03 PM  GAD 7 : Generalized Anxiety Score  Nervous, Anxious, on Edge 3 2 3 3   Control/stop worrying 2 1 2 3   Worry too much - different things 2 2 2 2   Trouble relaxing 1 3 2 2   Restless 1 3 2 1   Easily annoyed or irritable 1 2 1 2   Afraid - awful might happen 3 3 3 3   Total GAD 7 Score 13 16 15 16   Anxiety Difficulty Extremely difficult Extremely difficult Extremely difficult Extremely difficult      Suicidal/Homicidal:  No without intent/plan   Poor interpersonal relationships and support system and History of psychosis  Protective Factors: responsibility to others (children, family)  Plan: Patient will return in 2-4 weeks.   Diagnosis: Axis I: Schizoaffective  disorder, bipolar type (HCC)    Collaboration of Care: Psychiatrist AEB Dr. Kalman Jewels   Patient/Guardian was advised Release of Information must be obtained prior to any record release in order to collaborate their care with an outside provider. Patient/Guardian was advised if they have not already done so to contact the registration department to sign all necessary forms in order for [place] to release information regarding their care.   Consent: Patient/Guardian gives verbal consent for treatment and assignment of benefits for services provided during this visit. Patient/Guardian expressed  understanding and agreed to proceed.

## 2023-11-06 NOTE — Telephone Encounter (Signed)
 PROVIDER AUTHORIZED 90 DAY Rx REQUEST benztropine (COGENTIN) 0.5 MG tablet  SENT CO SIGN

## 2023-11-06 NOTE — Telephone Encounter (Signed)
 90 Day Prescription Request benztropine (COGENTIN) 0.5 MG tablet   NEXT APPT  02/26/24 LAST APPT  10/23/23

## 2023-11-27 ENCOUNTER — Ambulatory Visit (INDEPENDENT_AMBULATORY_CARE_PROVIDER_SITE_OTHER): Admitting: Licensed Clinical Social Worker

## 2023-11-27 DIAGNOSIS — F250 Schizoaffective disorder, bipolar type: Secondary | ICD-10-CM

## 2023-11-27 NOTE — Progress Notes (Unsigned)
 THERAPIST PROGRESS NOTE  Session Time: 10:58 am-11:40 am  Type of Therapy: Individual Therapy  Purpose of session/Treatment Goals addressed: Cody Roberts will manage anxiety and delusions as evidenced by living with some the anxiety or delusions without experiencing fear, increase self soothing, identify and challenge anxious/paranoid thoughts for 5 out of 7 days for 60 days.  Interventions: Therapist utilized CBT, Solution focused brief therapy,  and ACT   to address anxiety and mood. Therapist provided support and empathy to patient during session.  Therapist administered the PHQ9 and GAD7 to patient during session. Therapist processed patient's experience with anxious/paranoid thoughts. Therapist worked with patient on grounding techniques to stay present.    Effectiveness: Patient was oriented x4 (person, place, situation, and time) . Patient was  Anxious. Patient was Casually dressed. Patient completed the PHQ9 and GAD7.  Patient noted that he has had an increase in delusions and hallucinations. Patient is hearing laughing when he lays down at night when he shuts his eyes. Patient also feels like he is being watched and he is getting messages through commercials. Patient feels like this started due to listening to a band that he likes that he also listen to prior to his hospitalization. Patient started to associate the two. He started to hear messages to him from televisions such as "your second chance at life starts now." Patient was able to take a step back and understand this was directed at a national audience and not him. He thinks it came from a Freight forwarder. Patient then started to hear giggles at night which he thought someone was outside of the home. Patient realized that the giggles are coming from behind him but are in his head. And it only happens in this specific situation. Patient the started to feel the paranoia increasing. Patient was able to go to a family reunion and manage  his way through that. He stayed close to family or used his phone as a distraction to stay grounded. Patient is going to use grounding techniques to stay connected to his body, and stay in the present moment rather than allow his delusions to cause him to spiral.   Patient engaged in session. Patient responded well to interventions. Patient continues to meet criteria for Schizoaffective disorder, bipolar type [facility])  Patient will continue in outpatient therapy due to being the least restrictive service to meet his needs. Patient made minimal progress on his goals at this time.       11/27/2023   11:01 AM 11/06/2023   11:04 AM 09/25/2023    2:06 PM  Depression screen PHQ 2/9  Decreased Interest 1 1 2   Down, Depressed, Hopeless 2 2 1   PHQ - 2 Score 3 3 3   Altered sleeping 3 3 3   Tired, decreased energy 1 0 2  Change in appetite 2 1 2   Feeling bad or failure about yourself  3 2 1   Trouble concentrating 2 1 2   Moving slowly or fidgety/restless 1 0 0  Suicidal thoughts 0 0 0  PHQ-9 Score 15 10 13        11/27/2023   11:01 AM 11/06/2023   11:06 AM 09/25/2023    2:06 PM 08/28/2023   11:05 AM  GAD 7 : Generalized Anxiety Score  Nervous, Anxious, on Edge 3 3 2 3   Control/stop worrying 2 2 1 2   Worry too much - different things 3 2 2 2   Trouble relaxing 1 1 3 2   Restless 1 1 3 2   Easily annoyed or  irritable 1 1 2 1   Afraid - awful might happen 3 3 3 3   Total GAD 7 Score 14 13 16 15   Anxiety Difficulty Extremely difficult Extremely difficult Extremely difficult Extremely difficult      Suicidal/Homicidal:  No without intent/plan   Poor interpersonal relationships and support system and History of psychosis  Protective Factors: responsibility to others (children, family)  Plan: Patient will return in 2-4 weeks.   Diagnosis: Axis I: Schizoaffective disorder, bipolar type (HCC)    Collaboration of Care: Psychiatrist AEB Dr. Kalman Jewels   Patient/Guardian was advised Release of Information  must be obtained prior to any record release in order to collaborate their care with an outside provider. Patient/Guardian was advised if they have not already done so to contact the registration department to sign all necessary forms in order for [place] to release information regarding their care.   Consent: Patient/Guardian gives verbal consent for treatment and assignment of benefits for services provided during this visit. Patient/Guardian expressed understanding and agreed to proceed.

## 2023-12-18 ENCOUNTER — Ambulatory Visit (INDEPENDENT_AMBULATORY_CARE_PROVIDER_SITE_OTHER): Admitting: Licensed Clinical Social Worker

## 2023-12-18 DIAGNOSIS — F250 Schizoaffective disorder, bipolar type: Secondary | ICD-10-CM

## 2023-12-18 NOTE — Progress Notes (Unsigned)
 THERAPIST PROGRESS NOTE  Session Time: 11:02 am-11:45 am  Type of Therapy: Individual Therapy  Purpose of session/Treatment Goals addressed: Akshat will manage anxiety and delusions as evidenced by living with some the anxiety or delusions without experiencing fear, increase self soothing, identify and challenge anxious/paranoid thoughts for 5 out of 7 days for 60 days.  Interventions: Therapist utilized CBT, Solution focused brief therapy,  and ACT to address anxiety and mood. Therapist provided support and empathy to patient during session.  Therapist administered the PHQ9 and GAD7 to patient during session. Therapist worked with patient on living with anxiety and not experiencing fear   Effectiveness: Patient was oriented x4 (person, place, situation, and time) . Patient was  Anxious. Patient was Casually dressed. Patient completed the PHQ9 and GAD7.  Patient has been painting and broke some of his water colors. 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 Patient has been playing golf with his brother. He has been getting out of the home to do this. Patient also took pictures of his sister for her graduation at her 3 schools she attended. Patient shared that he has some reasonable worries when outside the home such as not wanting to have a pay to take photos at a certain area, or worried what others might think of his golf drive since he is still learning. Patient understood these were reasonable concerns. He continues to hear laughing at night when he shuts his eyes. He has been at the point of arguing back with them and the voice eventually fades. Patient is going to work on not acknowledging them.   Patient engaged in session. Patient responded well to interventions. Patient continues to meet criteria for Schizoaffective disorder, bipolar type [facility])  Patient will continue in outpatient therapy due to being the least restrictive service to meet his needs. Patient made minimal progress on his  goals at this time.       11/27/2023   11:01 AM 11/06/2023   11:04 AM 09/25/2023    2:06 PM  Depression screen PHQ 2/9  Decreased Interest 1 1 2   Down, Depressed, Hopeless 2 2 1   PHQ - 2 Score 3 3 3   Altered sleeping 3 3 3   Tired, decreased energy 1 0 2  Change in appetite 2 1 2   Feeling bad or failure about yourself  3 2 1   Trouble concentrating 2 1 2   Moving slowly or fidgety/restless 1 0 0  Suicidal thoughts 0 0 0  PHQ-9 Score 15 10 13        11/27/2023   11:01 AM 11/06/2023   11:06 AM 09/25/2023    2:06 PM 08/28/2023   11:05 AM  GAD 7 : Generalized Anxiety Score  Nervous, Anxious, on Edge 3 3 2 3   Control/stop worrying 2 2 1 2   Worry too much - different things 3 2 2 2   Trouble relaxing 1 1 3 2   Restless 1 1 3 2   Easily annoyed or irritable 1 1 2 1   Afraid - awful might happen 3 3 3 3   Total GAD 7 Score 14 13 16 15   Anxiety Difficulty Extremely difficult Extremely difficult Extremely difficult Extremely difficult      Suicidal/Homicidal:  No without intent/plan   Poor interpersonal relationships and support system and History of psychosis  Protective Factors: responsibility to others (children, family)  Plan: Patient will return in 2-4 weeks.   Diagnosis: Axis I: Schizoaffective disorder, bipolar type (HCC)    Collaboration of Care: Psychiatrist AEB Dr. Roseanna Cone  Patient/Guardian was advised Release of Information must be obtained prior to any record release in order to collaborate their care with an outside provider. Patient/Guardian was advised if they have not already done so to contact the registration department to sign all necessary forms in order for us  to release information regarding their care.   Consent: Patient/Guardian gives verbal consent for treatment and assignment of benefits for services provided during this visit. Patient/Guardian expressed understanding and agreed to proceed.

## 2024-01-04 ENCOUNTER — Other Ambulatory Visit (HOSPITAL_COMMUNITY): Payer: Self-pay | Admitting: Psychiatry

## 2024-01-08 ENCOUNTER — Ambulatory Visit (INDEPENDENT_AMBULATORY_CARE_PROVIDER_SITE_OTHER): Admitting: Licensed Clinical Social Worker

## 2024-01-08 DIAGNOSIS — F250 Schizoaffective disorder, bipolar type: Secondary | ICD-10-CM

## 2024-01-08 NOTE — Progress Notes (Signed)
 THERAPIST PROGRESS NOTE  Session Time: 11:00 am-11:50 am  Type of Therapy: Individual Therapy  Purpose of session/Treatment Goals addressed: Arius will manage anxiety and delusions as evidenced by living with some the anxiety or delusions without experiencing fear, increase self soothing, identify and challenge anxious/paranoid thoughts for 5 out of 7 days for 60 days.  Interventions: Therapist utilized CBT, Solution focused brief therapy,  and ACT to address anxiety and mood. Therapist provided support and empathy to patient during session.  Therapist administered the PHQ9 and GAD7 to patient during session. Therapist worked with patient to live with some anxiety or delusions without fear.    Effectiveness: Patient was oriented x4 (person, place, situation, and time) . Patient was  Anxious. Patient was Casually dressed. Patient completed the PHQ9 and GAD7. Patient noted that he has stopped doing his art for now and has got back into I-racing. He is spending more of his time in his room playing video games which previously had caused him paranoia. He experiences a very tolerable level of paranoia while in his room. Patient did have an experience where he was awoke by a buzzing noise then thought he saw a "blob" of mosquitos that disappeared. He was scared but was eventually able to fall back asleep. Patient has been scare to fall asleep due to worries that his hallucinations and paranoia will increase/return. Patient has been getting outside once a week and hitting golf balls in his back yard. He has been scared/paranoid to be in his back yard in the past. Patient started drinking. He wanted to get a buzz but continued to drink for about a week. He recognized it could be destructive for him, it made him feel depressed, and he woke up irritable. Patient stopped drinking because he had been in a bad place with alcohol in the past.   Patient engaged in session. Patient responded well to interventions.  Patient continues to meet criteria for Schizoaffective disorder, bipolar type [facility])  Patient will continue in outpatient therapy due to being the least restrictive service to meet his needs. Patient made minimal progress on his goals at this time.       01/08/2024   11:03 AM 12/18/2023   11:08 AM 11/27/2023   11:01 AM  Depression screen PHQ 2/9  Decreased Interest 1 1 1   Down, Depressed, Hopeless 1 1 2   PHQ - 2 Score 2 2 3   Altered sleeping 3 2 3   Tired, decreased energy 1 1 1   Change in appetite 3 3 2   Feeling bad or failure about yourself  1 2 3   Trouble concentrating 1 0 2  Moving slowly or fidgety/restless 0 0 1  Suicidal thoughts 0 0 0  PHQ-9 Score 11 10 15        01/08/2024   11:04 AM 12/18/2023   11:08 AM 11/27/2023   11:01 AM 11/06/2023   11:06 AM  GAD 7 : Generalized Anxiety Score  Nervous, Anxious, on Edge 2 2 3 3   Control/stop worrying 1 2 2 2   Worry too much - different things 2 3 3 2   Trouble relaxing 1 1 1 1   Restless 0 0 1 1  Easily annoyed or irritable 0 1 1 1   Afraid - awful might happen 2 3 3 3   Total GAD 7 Score 8 12 14 13   Anxiety Difficulty Very difficult  Extremely difficult Extremely difficult      Suicidal/Homicidal:  No without intent/plan   Poor interpersonal relationships and support system and History  of psychosis  Protective Factors: responsibility to others (children, family)  Plan: Patient will return in 2-4 weeks.   Diagnosis: Axis I: Schizoaffective disorder, bipolar type (HCC)    Collaboration of Care: Psychiatrist AEB Dr. Roseanna Cone   Patient/Guardian was advised Release of Information must be obtained prior to any record release in order to collaborate their care with an outside provider. Patient/Guardian was advised if they have not already done so to contact the registration department to sign all necessary forms in order for us  to release information regarding their care.   Consent: Patient/Guardian gives verbal consent for treatment  and assignment of benefits for services provided during this visit. Patient/Guardian expressed understanding and agreed to proceed.

## 2024-01-29 ENCOUNTER — Ambulatory Visit (INDEPENDENT_AMBULATORY_CARE_PROVIDER_SITE_OTHER): Admitting: Licensed Clinical Social Worker

## 2024-01-29 DIAGNOSIS — F250 Schizoaffective disorder, bipolar type: Secondary | ICD-10-CM | POA: Diagnosis not present

## 2024-01-29 NOTE — Progress Notes (Unsigned)
 THERAPIST PROGRESS NOTE  Session Time: 2:02 pm-2:45 pm  Type of Therapy: Individual Therapy  Purpose of session/Treatment Goals addressed: Cody Roberts will manage anxiety and delusions as evidenced by living with some the anxiety or delusions without experiencing fear, increase self soothing, identify and challenge anxious/paranoid thoughts for 5 out of 7 days for 60 days.  Interventions: Therapist utilized CBT, Solution focused brief therapy,  and ACT to address anxiety and mood. Therapist provided support and empathy to patient during session.  Therapist administered the PHQ9 and GAD7 to patient during session. Therapist worked with patient on challenging anxious/paranoid thoughts with self talk.    Effectiveness: Patient was oriented x4 (person, place, situation, and time) . Patient was  Anxious. Patient was Casually dressed. Patient completed the PHQ9 and GAD7. Patient noted that he has been sleeping excessively, mainly during the day, and not finding much pleasure in doing things. Patient has been playing video games and can't find one that he really enjoys playing but does find some enjoyment in playing. Patient has been working in the yard and feels some satisfaction with yard work. Patient has been through this before. He has had difficulty with sleep due to fear of nightmares which have been dreams that are unsettling or strange. He had a dream of someone holding a gun to his head. Patient feels like his fear/paranoia of someone breaking in the home is impacting his sleep. Patient is going to say to himself In this moment, I am safe. In this moment, I am loved to help calm himself down before sleep.    Patient engaged in session. Patient responded well to interventions. Patient continues to meet criteria for Schizoaffective disorder, bipolar type [facility])  Patient will continue in outpatient therapy due to being the least restrictive service to meet his needs. Patient made minimal progress on his  goals at this time.       01/29/2024    2:07 PM 01/08/2024   11:03 AM 12/18/2023   11:08 AM  Depression screen PHQ 2/9  Decreased Interest 3 1 1   Down, Depressed, Hopeless 2 1 1   PHQ - 2 Score 5 2 2   Altered sleeping 3 3 2   Tired, decreased energy 3 1 1   Change in appetite 2 3 3   Feeling bad or failure about yourself  2 1 2   Trouble concentrating 2 1 0  Moving slowly or fidgety/restless 0 0 0  Suicidal thoughts 0 0 0  PHQ-9 Score 17 11 10        01/29/2024    2:07 PM 01/08/2024   11:04 AM 12/18/2023   11:08 AM 11/27/2023   11:01 AM  GAD 7 : Generalized Anxiety Score  Nervous, Anxious, on Edge 2 2 2 3   Control/stop worrying 3 1 2 2   Worry too much - different things 3 2 3 3   Trouble relaxing 3 1 1 1   Restless 1 0 0 1  Easily annoyed or irritable 3 0 1 1  Afraid - awful might happen 2 2 3 3   Total GAD 7 Score 17 8 12 14   Anxiety Difficulty Very difficult Very difficult  Extremely difficult      Suicidal/Homicidal:  No without intent/plan   Poor interpersonal relationships and support system and History of psychosis  Protective Factors: responsibility to others (children, family)  Plan: Patient will return in 2-4 weeks.   Diagnosis: Axis I: Schizoaffective disorder, bipolar type (HCC)    Collaboration of Care: Psychiatrist AEB Dr. Roseanna Cone   Patient/Guardian was  advised Release of Information must be obtained prior to any record release in order to collaborate their care with an outside provider. Patient/Guardian was advised if they have not already done so to contact the registration department to sign all necessary forms in order for us  to release information regarding their care.   Consent: Patient/Guardian gives verbal consent for treatment and assignment of benefits for services provided during this visit. Patient/Guardian expressed understanding and agreed to proceed.

## 2024-02-09 ENCOUNTER — Other Ambulatory Visit (HOSPITAL_COMMUNITY): Payer: Self-pay | Admitting: Psychiatry

## 2024-02-18 ENCOUNTER — Telehealth (HOSPITAL_COMMUNITY): Payer: Self-pay

## 2024-02-18 DIAGNOSIS — F29 Unspecified psychosis not due to a substance or known physiological condition: Secondary | ICD-10-CM

## 2024-02-18 MED ORDER — CARBAMAZEPINE 100 MG PO CHEW
200.0000 mg | CHEWABLE_TABLET | Freq: Two times a day (BID) | ORAL | 0 refills | Status: DC
Start: 2024-02-18 — End: 2024-02-26

## 2024-02-18 NOTE — Telephone Encounter (Signed)
 A new Carbamazepine  100 mg chewable tablet order, #120 e-scribed to patient's [facility] in [place] on [address] per requested verbal order by Dr. Geralene.

## 2024-02-18 NOTE — Telephone Encounter (Signed)
 Medication refill - Fax from pt's [facility] on [address] for a new Carbamazepine  order, last provided 06/04/23 and last filled 08/20/23. Pt. last seen 10/23/23 and returns next on 02/26/24.

## 2024-02-19 ENCOUNTER — Ambulatory Visit (INDEPENDENT_AMBULATORY_CARE_PROVIDER_SITE_OTHER): Admitting: Licensed Clinical Social Worker

## 2024-02-19 DIAGNOSIS — F250 Schizoaffective disorder, bipolar type: Secondary | ICD-10-CM | POA: Diagnosis not present

## 2024-02-19 NOTE — Progress Notes (Signed)
 THERAPIST PROGRESS NOTE  Session Time: 2:15 pm-3:00 pm  Type of Therapy: Individual Therapy  Purpose of session/Treatment Goals addressed: Cody Roberts will manage anxiety and delusions as evidenced by living with some the anxiety or delusions without experiencing fear, increase self soothing, identify and challenge anxious/paranoid thoughts for 5 out of 7 days for 60 days.  Interventions: Therapist utilized CBT, Solution focused brief therapy,  and ACT to address anxiety and mood. Therapist provided support and empathy to patient during session.  Therapist administered the PHQ9 and GAD7 to patient during session. Therapist worked with patient on challenging anxious and paranoid thoughts.    Effectiveness: Patient was oriented x4 (person, place, situation, and time) . Patient was  Anxious. Patient was Casually dressed. Patient completed the PHQ9 and GAD7.  Patient noted that he has been struggling. He has been hearing tapping on the window if he stays up late. Patient has been experiencing auditory hallucinations and persecutory delusions where he feels individuals or groups are after him. He is not worried over getting physically hurt but more that they are psychologically going to harass him. Patient got a friend request from someone he used to speak to before he deleted all his friends. He ignored the request then later he heard taping on the window in his room at night. Patient associated the two events and was distressed. He was able to lay down in the living room and go to sleep. Patient has been playing video games, drawing, and painted with his grandmother. Patient also went golfing with his brother and enjoyed it.   Patient engaged in session. Patient responded well to interventions. Patient continues to meet criteria for Schizoaffective disorder, bipolar type [facility])  Patient will continue in outpatient therapy due to being the least restrictive service to meet his needs. Patient made minimal progress  on his goals at this time.       02/19/2024    2:20 PM 01/29/2024    2:07 PM 01/08/2024   11:03 AM  Depression screen PHQ 2/9  Decreased Interest 1 3 1   Down, Depressed, Hopeless 1 2 1   PHQ - 2 Score 2 5 2   Altered sleeping 2 3 3   Tired, decreased energy 1 3 1   Change in appetite 2 2 3   Feeling bad or failure about yourself  1 2 1   Trouble concentrating 1 2 1   Moving slowly or fidgety/restless 0 0 0  Suicidal thoughts  0 0  PHQ-9 Score 9 17 11        02/19/2024    2:20 PM 01/29/2024    2:07 PM 01/08/2024   11:04 AM 12/18/2023   11:08 AM  GAD 7 : Generalized Anxiety Score  Nervous, Anxious, on Edge 3 2 2 2   Control/stop worrying 3 3 1 2   Worry too much - different things 2 3 2 3   Trouble relaxing 2 3 1 1   Restless 1 1 0 0  Easily annoyed or irritable 2 3 0 1  Afraid - awful might happen 3 2 2 3   Total GAD 7 Score 16 17 8 12   Anxiety Difficulty Extremely difficult Very difficult Very difficult       Suicidal/Homicidal:  No without intent/plan   Poor interpersonal relationships and support system and History of psychosis  Protective Factors: responsibility to others (children, family)  Plan: Patient will return in 2-4 weeks.   Diagnosis: Axis I: Schizoaffective disorder, bipolar type (HCC)    Collaboration of Care: Psychiatrist AEB Dr. Jackey Po   Patient/Guardian  was advised Release of Information must be obtained prior to any record release in order to collaborate their care with an outside provider. Patient/Guardian was advised if they have not already done so to contact the registration department to sign all necessary forms in order for us  to release information regarding their care.   Consent: Patient/Guardian gives verbal consent for treatment and assignment of benefits for services provided during this visit. Patient/Guardian expressed understanding and agreed to proceed.

## 2024-02-26 ENCOUNTER — Ambulatory Visit (INDEPENDENT_AMBULATORY_CARE_PROVIDER_SITE_OTHER): Admitting: Psychiatry

## 2024-02-26 ENCOUNTER — Encounter (HOSPITAL_COMMUNITY): Payer: Self-pay | Admitting: Psychiatry

## 2024-02-26 VITALS — BP 115/87 | HR 81 | Ht 66.0 in | Wt 140.0 lb

## 2024-02-26 DIAGNOSIS — F29 Unspecified psychosis not due to a substance or known physiological condition: Secondary | ICD-10-CM | POA: Diagnosis not present

## 2024-02-26 DIAGNOSIS — F250 Schizoaffective disorder, bipolar type: Secondary | ICD-10-CM | POA: Diagnosis not present

## 2024-02-26 DIAGNOSIS — F411 Generalized anxiety disorder: Secondary | ICD-10-CM | POA: Diagnosis not present

## 2024-02-26 DIAGNOSIS — E8881 Metabolic syndrome: Secondary | ICD-10-CM | POA: Diagnosis not present

## 2024-02-26 MED ORDER — CARBAMAZEPINE 100 MG PO CHEW: 200.0000 mg | CHEWABLE_TABLET | Freq: Two times a day (BID) | ORAL | 0 refills | Status: DC

## 2024-02-26 MED ORDER — BENZTROPINE MESYLATE 0.5 MG PO TABS: 0.5000 mg | ORAL_TABLET | Freq: Every day | ORAL | 0 refills | Status: DC

## 2024-02-26 MED ORDER — ARIPIPRAZOLE 30 MG PO TABS: 30.0000 mg | ORAL_TABLET | Freq: Every day | ORAL | 0 refills | Status: DC
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
## 2024-03-26 ENCOUNTER — Ambulatory Visit (INDEPENDENT_AMBULATORY_CARE_PROVIDER_SITE_OTHER): Admitting: Licensed Clinical Social Worker

## 2024-03-26 DIAGNOSIS — F250 Schizoaffective disorder, bipolar type: Secondary | ICD-10-CM

## 2024-03-26 NOTE — Progress Notes (Signed)
 THERAPIST PROGRESS NOTE  Session Time: 8:08 am-8:50 am  Type of Therapy: Individual Therapy  Purpose of session/Treatment Goals addressed: Athol will manage anxiety and delusions as evidenced by living with some the anxiety or delusions without experiencing fear, increase self soothing, identify and challenge anxious/paranoid thoughts for 5 out of 7 days for 60 days.  Interventions: Therapist utilized CBT, Solution focused brief therapy,  and ACT to address anxiety and mood. Therapist provided support and empathy to patient during session.  Therapist worked with patient on living with anxiety and experiencing a delusion without fear.    Effectiveness: Patient was oriented x4 (person, place, situation, and time) . Patient was  Anxious. Patient was Casually dressed. Patient noted that things have been stable. He noted that he has stepped out of his comfort zone a few times including going to [employer]. He rode almost all of the rides at the park. He was a little nervous on a few of them. He also noted that he was a little nervous standing in line for long periods of time and being so close to others but he got through it. He enjoyed the experience despite some of his stressors. Patient also broke his rule #1 which was avoiding listening to a certain music artist that had triggered paranoia in the past. He listened to some of this artists instrumental music and the first night heard tapping on the window but not after that. Patient also had a situation where he laid down and heard his voice as well as his grandmother's voice having a conversation. Patient was confused by this but it has become normalized for him now. He was able to fall asleep shortly after this experience. Patient understood these moments will occur but that doesn't automatically mean he wil go back to the hospital which is one of his worries. Patient also played golf with his brother and met some of his brother's friends on the last  two holes of golf. He was able to enjoy playing golf despite discomfort being around his brother's friends. Patient continues to expose himself to triggers to increase his comfort zone.   Patient engaged in session. Patient responded well to interventions. Patient continues to meet criteria for Schizoaffective disorder, bipolar type [facility])  Patient will continue in outpatient therapy due to being the least restrictive service to meet his needs. Patient made minimal progress on his goals at this time.       02/19/2024    2:20 PM 01/29/2024    2:07 PM 01/08/2024   11:03 AM  Depression screen PHQ 2/9  Decreased Interest 1 3 1   Down, Depressed, Hopeless 1 2 1   PHQ - 2 Score 2 5 2   Altered sleeping 2 3 3   Tired, decreased energy 1 3 1   Change in appetite 2 2 3   Feeling bad or failure about yourself  1 2 1   Trouble concentrating 1 2 1   Moving slowly or fidgety/restless 0 0 0  Suicidal thoughts  0 0  PHQ-9 Score 9 17 11        02/19/2024    2:20 PM 01/29/2024    2:07 PM 01/08/2024   11:04 AM 12/18/2023   11:08 AM  GAD 7 : Generalized Anxiety Score  Nervous, Anxious, on Edge 3 2 2 2   Control/stop worrying 3 3 1 2   Worry too much - different things 2 3 2 3   Trouble relaxing 2 3 1 1   Restless 1 1 0 0  Easily annoyed or irritable 2  3 0 1  Afraid - awful might happen 3 2 2 3   Total GAD 7 Score 16 17 8 12   Anxiety Difficulty Extremely difficult Very difficult Very difficult       Suicidal/Homicidal:  No without intent/plan   Poor interpersonal relationships and support system and History of psychosis  Protective Factors: responsibility to others (children, family)  Plan: Patient will return in 2-4 weeks.   Diagnosis: Axis I: Schizoaffective disorder, bipolar type (HCC)    Collaboration of Care: Psychiatrist AEB Dr. Jackey Po   Patient/Guardian was advised Release of Information must be obtained prior to any record release in order to collaborate their care with an outside provider.  Patient/Guardian was advised if they have not already done so to contact the registration department to sign all necessary forms in order for us  to release information regarding their care.   Consent: Patient/Guardian gives verbal consent for treatment and assignment of benefits for services provided during this visit. Patient/Guardian expressed understanding and agreed to proceed.

## 2024-04-07 ENCOUNTER — Telehealth (HOSPITAL_COMMUNITY): Payer: Self-pay | Admitting: Psychiatry

## 2024-04-07 DIAGNOSIS — F29 Unspecified psychosis not due to a substance or known physiological condition: Secondary | ICD-10-CM

## 2024-04-07 MED ORDER — CARBAMAZEPINE 100 MG PO CHEW
200.0000 mg | CHEWABLE_TABLET | Freq: Two times a day (BID) | ORAL | 0 refills | Status: DC
Start: 2024-04-07 — End: 2024-05-02

## 2024-04-07 NOTE — Telephone Encounter (Signed)
 Fax received from patient pharmacy requesting 90-day prescription for  carbamazepine  (TEGRETOL ) 100 MG chewable tablet (360 tablets).    [id number] [place], [place] - [address] (Ph: [phone])   Last ordered: 02/26/2024 - 120 tablets Last visit: 02/26/2024 Next visit: 05/26/2024

## 2024-04-15 ENCOUNTER — Ambulatory Visit (HOSPITAL_COMMUNITY): Admitting: Licensed Clinical Social Worker

## 2024-04-15 DIAGNOSIS — F250 Schizoaffective disorder, bipolar type: Secondary | ICD-10-CM

## 2024-04-15 NOTE — Progress Notes (Unsigned)
 THERAPIST PROGRESS NOTE  Session Time: 8:08 am-8:50 am  Type of Therapy: Individual Therapy  Purpose of session/Treatment Goals addressed: Cody Roberts will manage anxiety and delusions as evidenced by living with some the anxiety or delusions without experiencing fear, increase self soothing, identify and challenge anxious/paranoid thoughts for 5 out of 7 days for 60 days.  Behavior:  Patient was oriented x4 (person, place, situation, and time) . Patient was  Anxious. Patient was Casually dressed. Patient made minimal progress on his goals at this time.   Interventions:    Response:   Patient engaged in session. Patient responded well to interventions. Patient continues to meet criteria for Schizoaffective disorder, bipolar type [facility])  Patient will continue in outpatient therapy due to being the least restrictive service to meet his needs.   Plan: Patient will return in 2-4 weeks.   Suicidal/Homicidal:  No without intent/plan     Protective Factors: responsibility to others (children, family)  Collaboration of Care: Psychiatrist AEB Dr. Jackey Ahktar        02/19/2024    2:20 PM 01/29/2024    2:07 PM 01/08/2024   11:03 AM  Depression screen PHQ 2/9  Decreased Interest 1 3 1   Down, Depressed, Hopeless 1 2 1   PHQ - 2 Score 2 5 2   Altered sleeping 2 3 3   Tired, decreased energy 1 3 1   Change in appetite 2 2 3   Feeling bad or failure about yourself  1 2 1   Trouble concentrating 1 2 1   Moving slowly or fidgety/restless 0 0 0  Suicidal thoughts  0 0  PHQ-9 Score 9 17 11        02/19/2024    2:20 PM 01/29/2024    2:07 PM 01/08/2024   11:04 AM 12/18/2023   11:08 AM  GAD 7 : Generalized Anxiety Score  Nervous, Anxious, on Edge 3 2 2 2   Control/stop worrying 3 3 1 2   Worry too much - different things 2 3 2 3   Trouble relaxing 2 3 1 1   Restless 1 1 0 0  Easily annoyed or irritable 2 3 0 1  Afraid - awful might happen 3 2 2 3   Total GAD 7 Score 16 17 8 12   Anxiety Difficulty Extremely  difficult Very difficult Very difficult         Patient/Guardian was advised Release of Information must be obtained prior to any record release in order to collaborate their care with an outside provider. Patient/Guardian was advised if they have not already done so to contact the registration department to sign all necessary forms in order for us  to release information regarding their care.   Consent: Patient/Guardian gives verbal consent for treatment and assignment of benefits for services provided during this visit. Patient/Guardian expressed understanding and agreed to proceed.

## 2024-05-01 ENCOUNTER — Telehealth (HOSPITAL_COMMUNITY): Payer: Self-pay | Admitting: Psychiatry

## 2024-05-01 DIAGNOSIS — F29 Unspecified psychosis not due to a substance or known physiological condition: Secondary | ICD-10-CM

## 2024-05-01 NOTE — Telephone Encounter (Signed)
 Received fax from patient's pharmacy requesting 90-Day prescription ( 360 tablets) for carbamazepine  (TEGRETOL ) 100 MG chewable tablet .   [id number] [place], [place] - [address] Phone: [phone]  Fax: [phone]     Last ordered: 04/07/2024 - 180 tablets Last visit: 02/26/2024 Next visit: 05/26/2024

## 2024-05-02 MED ORDER — CARBAMAZEPINE 100 MG PO CHEW: 200.0000 mg | CHEWABLE_TABLET | Freq: Two times a day (BID) | ORAL | 0 refills | Status: DC

## 2024-05-06 ENCOUNTER — Ambulatory Visit (HOSPITAL_COMMUNITY): Admitting: Licensed Clinical Social Worker

## 2024-05-06 DIAGNOSIS — F250 Schizoaffective disorder, bipolar type: Secondary | ICD-10-CM

## 2024-05-06 NOTE — Progress Notes (Unsigned)
 THERAPIST PROGRESS NOTE  Session Time:   Type of Therapy: Individual Therapy  Treatment Goals: Kamdyn will manage anxiety and delusions as evidenced by living with some the anxiety or delusions without experiencing fear, increase self soothing, identify and challenge anxious/paranoid thoughts for 5 out of 7 days for 60 days.  Session Goals:   Behavior: .  Patient was oriented x4 (person, place, situation, and time) . Patient was  Anxious. Patient was Casually dressed. Patient made minimal progress on his goals at this time.   Interventions:    Response:   Patient engaged in session. Patient responded well to interventions. Patient continues to meet criteria for Schizoaffective disorder, bipolar type [facility])  Patient will continue in outpatient therapy due to being the least restrictive service to meet his needs.   Plan: Patient will return in 2-4 weeks. Be consistent with golf, reduce frequency of looking in mirror while driving, and leave the room when grandmother is talking to mother on the phone for now.   Suicidal/Homicidal:  No without intent/plan     Protective Factors: responsibility to others (children, family)  Collaboration of Care: Psychiatrist AEB Dr. Jackey Ahktar        02/19/2024    2:20 PM 01/29/2024    2:07 PM 01/08/2024   11:03 AM  Depression screen PHQ 2/9  Decreased Interest 1 3 1   Down, Depressed, Hopeless 1 2 1   PHQ - 2 Score 2 5 2   Altered sleeping 2 3 3   Tired, decreased energy 1 3 1   Change in appetite 2 2 3   Feeling bad or failure about yourself  1 2 1   Trouble concentrating 1 2 1   Moving slowly or fidgety/restless 0 0 0  Suicidal thoughts  0 0  PHQ-9 Score 9 17 11        02/19/2024    2:20 PM 01/29/2024    2:07 PM 01/08/2024   11:04 AM 12/18/2023   11:08 AM  GAD 7 : Generalized Anxiety Score  Nervous, Anxious, on Edge 3 2 2 2   Control/stop worrying 3 3 1 2   Worry too much - different things 2 3 2 3   Trouble relaxing 2 3 1 1   Restless 1 1 0 0   Easily annoyed or irritable 2 3 0 1  Afraid - awful might happen 3 2 2 3   Total GAD 7 Score 16 17 8 12   Anxiety Difficulty Extremely difficult Very difficult Very difficult         Patient/Guardian was advised Release of Information must be obtained prior to any record release in order to collaborate their care with an outside provider. Patient/Guardian was advised if they have not already done so to contact the registration department to sign all necessary forms in order for us  to release information regarding their care.   Consent: Patient/Guardian gives verbal consent for treatment and assignment of benefits for services provided during this visit. Patient/Guardian expressed understanding and agreed to proceed.

## 2024-05-16 LAB — CBC WITH DIFFERENTIAL/PLATELET
Basophils Absolute: 0 x10E3/uL (ref 0.0–0.2)
Basos: 1 %
EOS (ABSOLUTE): 0 x10E3/uL (ref 0.0–0.4)
Eos: 0 %
Hematocrit: 51.6 % — ABNORMAL HIGH (ref 37.5–51.0)
Hemoglobin: 17.4 g/dL (ref 13.0–17.7)
Immature Grans (Abs): 0 x10E3/uL (ref 0.0–0.1)
Immature Granulocytes: 0 %
Lymphocytes Absolute: 1.8 x10E3/uL (ref 0.7–3.1)
Lymphs: 43 %
MCH: 31.2 pg (ref 26.6–33.0)
MCHC: 33.7 g/dL (ref 31.5–35.7)
MCV: 93 fL (ref 79–97)
Monocytes Absolute: 0.3 x10E3/uL (ref 0.1–0.9)
Monocytes: 6 %
Neutrophils Absolute: 2 x10E3/uL (ref 1.4–7.0)
Neutrophils: 49 %
Platelets: 206 x10E3/uL (ref 150–450)
RBC: 5.57 x10E6/uL (ref 4.14–5.80)
RDW: 14.9 % (ref 11.6–15.4)
WBC: 4.1 x10E3/uL (ref 3.4–10.8)

## 2024-05-16 LAB — HEMOGLOBIN A1C
Est. average glucose Bld gHb Est-mCnc: 91 mg/dL
Hgb A1c MFr Bld: 4.8 % (ref 4.8–5.6)

## 2024-05-16 LAB — LIPID PANEL
Chol/HDL Ratio: 6 ratio — ABNORMAL HIGH (ref 0.0–5.0)
Cholesterol, Total: 216 mg/dL — ABNORMAL HIGH (ref 100–199)
HDL: 36 mg/dL — ABNORMAL LOW (ref >39)
LDL Chol Calc (NIH): 142 mg/dL — ABNORMAL HIGH (ref 0–99)
Triglycerides: 209 mg/dL — ABNORMAL HIGH (ref 0–149)
VLDL Cholesterol Cal: 38 mg/dL (ref 5–40)

## 2024-05-16 LAB — CARBAMAZEPINE LEVEL, TOTAL: Carbamazepine (Tegretol), S: 5 ug/mL (ref 4.0–12.0)

## 2024-05-20 ENCOUNTER — Ambulatory Visit (INDEPENDENT_AMBULATORY_CARE_PROVIDER_SITE_OTHER): Admitting: Licensed Clinical Social Worker

## 2024-05-20 DIAGNOSIS — F250 Schizoaffective disorder, bipolar type: Secondary | ICD-10-CM

## 2024-05-21 NOTE — Progress Notes (Signed)
 THERAPIST PROGRESS NOTE  Session Time:  2:00 pm-2:45 pm  Type of Therapy: Individual Therapy  Treatment Goals: Naeem will manage anxiety and delusions as evidenced by living with some the anxiety or delusions without experiencing fear, increase self soothing, identify and challenge anxious/paranoid thoughts for 5 out of 7 days for 60 days.  Session Goals: depression, delusions  Behavior:  Patient was able to take his dog out at night while at the hotel. He was fine doing it. Patient has had an increase in feeling down due to loneliness at night. Patient has continued to have delusions as well.  Patient was oriented x4 (person, place, situation, and time) Patient was  Anxious. Patient was Casually dressed. Patient made minimal progress on his goals at this time.   Interventions: Therapist used DBT interventions of distress tolerance and radical acceptance to get more comfortable around crowds, deal with feelings of loneliness, and delusions.    Response: Patient agreed to continue to build on his progress. He is going to try to find someone online or in person to talk to to challenge his lonely feelings. Patient is accepting his delusions and is able to fall asleep or back to sleep after being woke up due to auditory hallucinations.   Patient engaged in session. Patient responded well to interventions. Patient continues to meet criteria for Schizoaffective disorder, bipolar type [facility])  Patient will continue in outpatient therapy due to being the least restrictive service to meet his needs.   Plan: Patient will return in 2-4 weeks. Patient will continue to expose himself to stressful triggers.   Suicidal/Homicidal:  No without intent/plan     Protective Factors: responsibility to others (children, family)  Collaboration of Care: Psychiatrist AEB Dr. Jackey Ahktar        02/19/2024    2:20 PM 01/29/2024    2:07 PM 01/08/2024   11:03 AM  Depression screen PHQ 2/9  Decreased Interest 1 3 1    Down, Depressed, Hopeless 1 2 1   PHQ - 2 Score 2 5 2   Altered sleeping 2 3 3   Tired, decreased energy 1 3 1   Change in appetite 2 2 3   Feeling bad or failure about yourself  1 2 1   Trouble concentrating 1 2 1   Moving slowly or fidgety/restless 0 0 0  Suicidal thoughts  0 0  PHQ-9 Score 9 17 11        02/19/2024    2:20 PM 01/29/2024    2:07 PM 01/08/2024   11:04 AM 12/18/2023   11:08 AM  GAD 7 : Generalized Anxiety Score  Nervous, Anxious, on Edge 3 2 2 2   Control/stop worrying 3 3 1 2   Worry too much - different things 2 3 2 3   Trouble relaxing 2 3 1 1   Restless 1 1 0 0  Easily annoyed or irritable 2 3 0 1  Afraid - awful might happen 3 2 2 3   Total GAD 7 Score 16 17 8 12   Anxiety Difficulty Extremely difficult Very difficult Very difficult         Patient/Guardian was advised Release of Information must be obtained prior to any record release in order to collaborate their care with an outside provider. Patient/Guardian was advised if they have not already done so to contact the registration department to sign all necessary forms in order for us  to release information regarding their care.   Consent: Patient/Guardian gives verbal consent for treatment and assignment of benefits for services provided during this visit. Patient/Guardian  expressed understanding and agreed to proceed.

## 2024-05-26 ENCOUNTER — Telehealth (HOSPITAL_COMMUNITY): Admitting: Psychiatry

## 2024-06-03 ENCOUNTER — Ambulatory Visit (HOSPITAL_COMMUNITY): Admitting: Licensed Clinical Social Worker

## 2024-06-10 ENCOUNTER — Ambulatory Visit (HOSPITAL_COMMUNITY): Admitting: Psychiatry

## 2024-06-10 ENCOUNTER — Encounter (HOSPITAL_COMMUNITY): Payer: Self-pay | Admitting: Psychiatry

## 2024-06-10 VITALS — BP 127/91 | HR 63 | Temp 98.2°F | Ht 66.0 in | Wt 209.6 lb

## 2024-06-10 DIAGNOSIS — F411 Generalized anxiety disorder: Secondary | ICD-10-CM

## 2024-06-10 DIAGNOSIS — F250 Schizoaffective disorder, bipolar type: Secondary | ICD-10-CM | POA: Diagnosis not present

## 2024-06-10 DIAGNOSIS — F1210 Cannabis abuse, uncomplicated: Secondary | ICD-10-CM | POA: Diagnosis not present

## 2024-06-10 MED ORDER — ARIPIPRAZOLE 30 MG PO TABS: 30.0000 mg | ORAL_TABLET | Freq: Every day | ORAL | 0 refills | Status: AC

## 2024-06-10 MED ORDER — HYDROXYZINE HCL 25 MG PO TABS: 25.0000 mg | ORAL_TABLET | Freq: Every day | ORAL | 0 refills | Status: DC | PRN

## 2024-06-10 MED ORDER — BENZTROPINE MESYLATE 0.5 MG PO TABS: 0.5000 mg | ORAL_TABLET | Freq: Every day | ORAL | 0 refills | Status: AC

## 2024-06-10 MED ORDER — FLUOXETINE HCL 10 MG PO CAPS: 10.0000 mg | ORAL_CAPSULE | Freq: Every day | ORAL | 1 refills | Status: DC
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
## 2024-06-11 ENCOUNTER — Telehealth: Payer: Self-pay | Admitting: Family Medicine

## 2024-06-11 NOTE — Telephone Encounter (Signed)
 Left message for a return call

## 2024-06-11 NOTE — Telephone Encounter (Signed)
 Call patient and let him know that I did look over his cholesterol results his triglycerides as well as his LDL cholesterol are elevated.  Usually we try to get this under better control with diet and exercise recommend starting with the Mediterranean diet.  There is some great resources online from the American Heart Association for links to recipes and more information about the Mediterranean diet.  In addition please shoot for about 30 minutes of exercise 5 days a week.  This is above and beyond your daily activities. Also noticed weight was up most 20 pounds over the last year and a half.  If he could work on some rate reduction maybe even just starting with about 10 to 15 pounds that can make a big difference in cholesterol levels as well.  I would love to follow-up and recheck his lipids again maybe in 4 to 6 months to see if you are making some improvement.

## 2024-06-12 ENCOUNTER — Ambulatory Visit (INDEPENDENT_AMBULATORY_CARE_PROVIDER_SITE_OTHER): Admitting: Licensed Clinical Social Worker

## 2024-06-12 DIAGNOSIS — F411 Generalized anxiety disorder: Secondary | ICD-10-CM

## 2024-06-12 DIAGNOSIS — F250 Schizoaffective disorder, bipolar type: Secondary | ICD-10-CM | POA: Diagnosis not present

## 2024-06-12 NOTE — Progress Notes (Unsigned)
 THERAPIST PROGRESS NOTE  Session Time:  11:00 am-11:45 pm  Type of Therapy: Individual Therapy  Treatment Goals: Nachmen will manage anxiety and delusions as evidenced by living with some the anxiety or delusions without experiencing fear, increase self soothing, identify and challenge anxious/paranoid thoughts for 5 out of 7 days for 60 days.  Session Goals: depression, delusions  Behavior:  Patient noted that he has been feeling down. He reported that his mother and sister have got frustrated at his grandmother for how she caters to him. Patient wants to avoid being around them now because it hurt their feelings. Patient was oriented x4 (person, place, situation, and time) Patient was  Anxious. Patient was Casually dressed. Patient made moderate progress on his goals at this time.   Interventions: Therapist used Solution focused brief therapy interventions of pre-session change, and ACT and CBT to identify his values that guide his decisions.    Response: Patient noted that he has been feeling down based off what his family said about him. He has been taking steps to be be more independent. Patient would like to possibly work in the future but it would have to be a job that didn't involve a lot of interaction with others or something related to music but he feels like music triggers his paranoia. Patient stayed at his home for the weekend by himself. He was anxious but was able to get through it. Patient has been able to get out of the house and be around crowds. Patient still has the goal to go to [place] or [place]. The steps he takes now, with help him prepare for those future goals. These values help him take action with manage delusion.   Patient engaged in session. Patient responded well to interventions. Patient continues to meet criteria for Schizoaffective disorder, bipolar type (HCC)  GAD (generalized anxiety disorder)  Patient will continue in outpatient therapy due to being the least  restrictive service to meet his needs.   Plan: Patient will return in 2-4 weeks. Patient will continue to expose himself to stressful triggers.   Suicidal/Homicidal:  No without intent/plan     Protective Factors: responsibility to others (children, family)  Collaboration of Care: Psychiatrist AEB Dr. Jackey Ahktar        06/10/2024    1:00 PM 02/19/2024    2:20 PM 01/29/2024    2:07 PM  Depression screen PHQ 2/9  Decreased Interest 1 1 3   Down, Depressed, Hopeless 1 1 2   PHQ - 2 Score 2 2 5   Altered sleeping 0 2 3  Tired, decreased energy 1 1 3   Change in appetite 3 2 2   Feeling bad or failure about yourself  2 1 2   Trouble concentrating 0 1 2  Moving slowly or fidgety/restless 0 0 0  Suicidal thoughts 0  0  PHQ-9 Score 8 9 17   Difficult doing work/chores Somewhat difficult         06/10/2024    1:01 PM 02/19/2024    2:20 PM 01/29/2024    2:07 PM 01/08/2024   11:04 AM  GAD 7 : Generalized Anxiety Score  Nervous, Anxious, on Edge 2 3 2 2   Control/stop worrying 3 3 3 1   Worry too much - different things 3 2 3 2   Trouble relaxing 1 2 3 1   Restless 1 1 1  0  Easily annoyed or irritable 2 2 3  0  Afraid - awful might happen 3 3 2 2   Total GAD 7 Score 15  16 17 8   Anxiety Difficulty Somewhat difficult Extremely difficult Very difficult Very difficult        Patient/Guardian was advised Release of Information must be obtained prior to any record release in order to collaborate their care with an outside provider. Patient/Guardian was advised if they have not already done so to contact the registration department to sign all necessary forms in order for us  to release information regarding their care.   Consent: Patient/Guardian gives verbal consent for treatment and assignment of benefits for services provided during this visit. Patient/Guardian expressed understanding and agreed to proceed.

## 2024-06-13 NOTE — Telephone Encounter (Signed)
 Patient informed of results. He did not have any questions regarding recommendations from provider or results.

## 2024-06-19 ENCOUNTER — Ambulatory Visit (INDEPENDENT_AMBULATORY_CARE_PROVIDER_SITE_OTHER): Admitting: Licensed Clinical Social Worker

## 2024-06-19 DIAGNOSIS — F411 Generalized anxiety disorder: Secondary | ICD-10-CM | POA: Diagnosis not present

## 2024-06-19 DIAGNOSIS — F250 Schizoaffective disorder, bipolar type: Secondary | ICD-10-CM | POA: Diagnosis not present

## 2024-06-19 NOTE — Progress Notes (Unsigned)
 THERAPIST PROGRESS NOTE  Session Time:  1:55 pm-2:40 pm  Type of Therapy: Individual Therapy  Treatment Goals: Cody Roberts will manage anxiety and delusions as evidenced by living with some the anxiety or delusions without experiencing fear, increase self soothing, identify and challenge anxious/paranoid thoughts for 5 out of 7 days for 60 days.  Session Goals: depression, delusions  Behavior: Patient was oriented x4 (person, place, situation, and time) Patient was  Anxious. Patient was Casually dressed. Patient made moderate progress on his goals at this time.   Interventions:    Response:   Patient engaged in session. Patient responded well to interventions. Patient continues to meet criteria for No diagnosis found.  Patient will continue in outpatient therapy due to being the least restrictive service to meet his needs.   Plan: Patient will return in 2-4 weeks. Patient will continue to expose himself to stressful triggers.   Suicidal/Homicidal:  No without intent/plan     Protective Factors: responsibility to others (children, family)  Collaboration of Care: Psychiatrist AEB Dr. Jackey Ahktar        06/10/2024    1:00 PM 02/19/2024    2:20 PM 01/29/2024    2:07 PM  Depression screen PHQ 2/9  Decreased Interest 1 1 3   Down, Depressed, Hopeless 1 1 2   PHQ - 2 Score 2 2 5   Altered sleeping 0 2 3  Tired, decreased energy 1 1 3   Change in appetite 3 2 2   Feeling bad or failure about yourself  2 1 2   Trouble concentrating 0 1 2  Moving slowly or fidgety/restless 0 0 0  Suicidal thoughts 0  0  PHQ-9 Score 8 9 17   Difficult doing work/chores Somewhat difficult         06/10/2024    1:01 PM 02/19/2024    2:20 PM 01/29/2024    2:07 PM 01/08/2024   11:04 AM  GAD 7 : Generalized Anxiety Score  Nervous, Anxious, on Edge 2 3 2 2   Control/stop worrying 3 3 3 1   Worry too much - different things 3 2 3 2   Trouble relaxing 1 2 3 1   Restless 1 1 1  0  Easily annoyed or irritable 2 2 3  0   Afraid - awful might happen 3 3 2 2   Total GAD 7 Score 15 16 17 8   Anxiety Difficulty Somewhat difficult Extremely difficult Very difficult Very difficult        Patient/Guardian was advised Release of Information must be obtained prior to any record release in order to collaborate their care with an outside provider. Patient/Guardian was advised if they have not already done so to contact the registration department to sign all necessary forms in order for us  to release information regarding their care.   Consent: Patient/Guardian gives verbal consent for treatment and assignment of benefits for services provided during this visit. Patient/Guardian expressed understanding and agreed to proceed.

## 2024-07-04 ENCOUNTER — Other Ambulatory Visit (HOSPITAL_COMMUNITY): Payer: Self-pay | Admitting: Psychiatry

## 2024-07-08 ENCOUNTER — Ambulatory Visit (HOSPITAL_COMMUNITY): Admitting: Psychiatry

## 2024-07-10 ENCOUNTER — Ambulatory Visit (HOSPITAL_COMMUNITY): Admitting: Licensed Clinical Social Worker

## 2024-07-10 DIAGNOSIS — F29 Unspecified psychosis not due to a substance or known physiological condition: Secondary | ICD-10-CM

## 2024-07-10 NOTE — Progress Notes (Signed)
 THERAPIST PROGRESS NOTE  Session Time:  11:30 am-12:15 pm  Type of Therapy: Individual Therapy  Treatment Goals: Jeren will manage anxiety and delusions as evidenced by living with some the anxiety or delusions without experiencing fear, increase self soothing, identify and challenge anxious/paranoid thoughts for 5 out of 7 days for 60 days.  Session Goals: living with anxiety or delusions without experiencing fear, Jovante will practice problem solving skills 3 times per week for the next 4 weeks   Behavior:  Patient noted that he has experienced an reduction in loneliness and depressive symptoms. Patient's sleep is off but he is getting enough sleep. He is playing a lot of video games. Patient was oriented x4 (person, place, situation, and time) Patient was  Anxious. Patient was Casually dressed. Patient made moderate progress on his goals at this time.   Interventions: Therapist used SFBT interventions of exceptions manage mood and anxiety.    Response: Patient stayed by himself for the weekend again. He was less nervous because he had access to the care. Patient has been getting out of the home to get food for himself and his grandmother but not to do things like golf. Patient has been playing a video game that he has been avoiding for 4 years because it gave him anxiety. He has been building up a tolerance. Patient is willing to walk outside a few times a week and get a mile in with his dog.   Patient engaged in session. Patient responded well to interventions. Patient continues to meet criteria for Schizophrenia spectrum disorder with psychotic disorder type not yet determined [facility])  Patient will continue in outpatient therapy due to being the least restrictive service to meet his needs.   Plan: Patient will return in 2-4 weeks. Patient will continue to build up a tolerance to anxiety by exposing himself to distressing situations as well as start walking in his neighborhood.    Suicidal/Homicidal:  No without intent/plan     Protective Factors: responsibility to others (children, family)  Collaboration of Care: Psychiatrist AEB Dr. Jackey Ahktar        06/10/2024    1:00 PM 02/19/2024    2:20 PM 01/29/2024    2:07 PM  Depression screen PHQ 2/9  Decreased Interest 1 1 3   Down, Depressed, Hopeless 1 1 2   PHQ - 2 Score 2 2 5   Altered sleeping 0 2 3  Tired, decreased energy 1 1 3   Change in appetite 3 2 2   Feeling bad or failure about yourself  2 1 2   Trouble concentrating 0 1 2  Moving slowly or fidgety/restless 0 0 0  Suicidal thoughts 0  0  PHQ-9 Score 8  9  17    Difficult doing work/chores Somewhat difficult       Data saved with a previous flowsheet row definition       06/10/2024    1:01 PM 02/19/2024    2:20 PM 01/29/2024    2:07 PM 01/08/2024   11:04 AM  GAD 7 : Generalized Anxiety Score  Nervous, Anxious, on Edge 2 3 2 2   Control/stop worrying 3 3 3 1   Worry too much - different things 3 2 3 2   Trouble relaxing 1 2 3 1   Restless 1 1 1  0  Easily annoyed or irritable 2 2 3  0  Afraid - awful might happen 3 3 2 2   Total GAD 7 Score 15 16 17 8   Anxiety Difficulty Somewhat difficult Extremely difficult Very difficult Very  difficult        Patient/Guardian was advised Release of Information must be obtained prior to any record release in order to collaborate their care with an outside provider. Patient/Guardian was advised if they have not already done so to contact the registration department to sign all necessary forms in order for us  to release information regarding their care.   Consent: Patient/Guardian gives verbal consent for treatment and assignment of benefits for services provided during this visit. Patient/Guardian expressed understanding and agreed to proceed.

## 2024-07-24 ENCOUNTER — Ambulatory Visit (INDEPENDENT_AMBULATORY_CARE_PROVIDER_SITE_OTHER): Admitting: Licensed Clinical Social Worker

## 2024-07-24 DIAGNOSIS — F411 Generalized anxiety disorder: Secondary | ICD-10-CM

## 2024-07-24 DIAGNOSIS — F29 Unspecified psychosis not due to a substance or known physiological condition: Secondary | ICD-10-CM

## 2024-07-25 NOTE — Progress Notes (Signed)
 THERAPIST PROGRESS NOTE  Session Time:  11:07 am-12:00 pm  Type of Therapy: Individual Therapy  Treatment Goals: Hanz will manage anxiety and delusions as evidenced by living with some the anxiety or delusions without experiencing fear, increase self soothing, identify and challenge anxious/paranoid thoughts for 5 out of 7 days for 60 days.  Session Goals: living with anxiety or delusions without experiencing fear, Cola will practice problem solving skills 3 times per week for the next 4 weeks   Behavior:  Patient noted that Thanksgiving went well. He was able to stay out and be with his family the whole time. He socialized with them as well. He started to get a little antsy when it started to get late.  Patient was oriented x4 (person, place, situation, and time) Patient was  Anxious. Patient was Casually dressed. Patient made moderate progress on his goals at this time.   Interventions: Therapist used DBT interventions of distress tolerance.    Response: Patient feels like he is able to tolerant stress of social situations better than before. Patient was feeling lonely and reached out to someone that had sent him a friend request a few months ago but he had ignored. He had hoped to instantly reconnect with this person but it appeared that they got offline shortly after he sent the friend request. He felt like it was due to him. Patient is waiting to see if he gets a response from this friend. He wants to play games with some people outside of his brother. Patient already has a plan for Christmas when family comes over. He is planning to stay out with them as long as he can but does worry his younger sisters may get up at their mother for not getting the correct gift for them. If this happens, he plans to go to another room and wait until things cool down then come back out. He is going to wait until he hears laughter or have his grandmother come to get him when things calm down.   Patient  engaged in session. Patient responded well to interventions. Patient continues to meet criteria for Schizophrenia spectrum disorder with psychotic disorder type not yet determined (HCC)  GAD (generalized anxiety disorder)  Patient will continue in outpatient therapy due to being the least restrictive service to meet his needs.   Plan: Patient will return in 2-4 weeks.   Suicidal/Homicidal:  No without intent/plan     Protective Factors: responsibility to others (children, family)  Collaboration of Care: Psychiatrist AEB Dr. Jackey Ahktar        06/10/2024    1:00 PM 02/19/2024    2:20 PM 01/29/2024    2:07 PM  Depression screen PHQ 2/9  Decreased Interest 1 1 3   Down, Depressed, Hopeless 1 1 2   PHQ - 2 Score 2 2 5   Altered sleeping 0 2 3  Tired, decreased energy 1 1 3   Change in appetite 3 2 2   Feeling bad or failure about yourself  2 1 2   Trouble concentrating 0 1 2  Moving slowly or fidgety/restless 0 0 0  Suicidal thoughts 0  0  PHQ-9 Score 8  9  17    Difficult doing work/chores Somewhat difficult       Data saved with a previous flowsheet row definition       06/10/2024    1:01 PM 02/19/2024    2:20 PM 01/29/2024    2:07 PM 01/08/2024   11:04 AM  GAD 7 : Generalized Anxiety  Score  Nervous, Anxious, on Edge 2 3 2 2   Control/stop worrying 3 3 3 1   Worry too much - different things 3 2 3 2   Trouble relaxing 1 2 3 1   Restless 1 1 1  0  Easily annoyed or irritable 2 2 3  0  Afraid - awful might happen 3 3 2 2   Total GAD 7 Score 15 16 17 8   Anxiety Difficulty Somewhat difficult Extremely difficult Very difficult Very difficult        Patient/Guardian was advised Release of Information must be obtained prior to any record release in order to collaborate their care with an outside provider. Patient/Guardian was advised if they have not already done so to contact the registration department to sign all necessary forms in order for us  to release information regarding their  care.   Consent: Patient/Guardian gives verbal consent for treatment and assignment of benefits for services provided during this visit. Patient/Guardian expressed understanding and agreed to proceed.

## 2024-07-31 ENCOUNTER — Encounter (HOSPITAL_COMMUNITY): Payer: Self-pay | Admitting: Psychiatry

## 2024-07-31 ENCOUNTER — Telehealth (INDEPENDENT_AMBULATORY_CARE_PROVIDER_SITE_OTHER): Admitting: Psychiatry

## 2024-07-31 DIAGNOSIS — F411 Generalized anxiety disorder: Secondary | ICD-10-CM

## 2024-07-31 DIAGNOSIS — F333 Major depressive disorder, recurrent, severe with psychotic symptoms: Secondary | ICD-10-CM

## 2024-07-31 DIAGNOSIS — F250 Schizoaffective disorder, bipolar type: Secondary | ICD-10-CM

## 2024-07-31 DIAGNOSIS — F29 Unspecified psychosis not due to a substance or known physiological condition: Secondary | ICD-10-CM

## 2024-07-31 MED ORDER — CARBAMAZEPINE 100 MG PO CHEW: 200.0000 mg | CHEWABLE_TABLET | Freq: Two times a day (BID) | ORAL | 0 refills | Status: AC
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
## 2024-08-07 ENCOUNTER — Ambulatory Visit (INDEPENDENT_AMBULATORY_CARE_PROVIDER_SITE_OTHER): Admitting: Licensed Clinical Social Worker

## 2024-08-07 DIAGNOSIS — F29 Unspecified psychosis not due to a substance or known physiological condition: Secondary | ICD-10-CM

## 2024-08-07 NOTE — Progress Notes (Unsigned)
 THERAPIST PROGRESS NOTE  Session Time:  11:05 am-  Type of Therapy: Individual Therapy  Treatment Goals: Cody Roberts will manage anxiety and delusions as evidenced by living with some the anxiety or delusions without experiencing fear, increase self soothing, identify and challenge anxious/paranoid thoughts for 5 out of 7 days for 60 days.  Session Goals: living with anxiety or delusions without experiencing fear, Cody Roberts will practice problem solving skills 3 times per week for the next 4 weeks   Behavior:    Patient was oriented x4 (person, place, situation, and time) Patient was  Anxious. Patient was Casually dressed. Patient made moderate progress on his goals at this time.   Interventions:    Response: Patient feels like he is able to tolerant stress of social situations better than before. Patient was feeling lonely and reached out to someone that had sent him a friend request a few months ago but he had ignored. He had hoped to instantly reconnect with this person but it appeared that they got offline shortly after he sent the friend request. He felt like it was due to him. Patient is waiting to see if he gets a response from this friend. He wants to play games with some people outside of his brother. Patient already has a plan for Christmas when family comes over. He is planning to stay out with them as long as he can but does worry his younger sisters may get up at their mother for not getting the correct gift for them. If this happens, he plans to go to another room and wait until things cool down then come back out. He is going to wait until he hears laughter or have his grandmother come to get him when things calm down.   Patient engaged in session. Patient responded well to interventions. Patient continues to meet criteria for Schizophrenia spectrum disorder with psychotic disorder type not yet determined [facility])  Patient will continue in outpatient therapy due to being the least restrictive  service to meet his needs.   Plan: Patient will return in 2-4 weeks.   Suicidal/Homicidal:  No without intent/plan     Protective Factors: responsibility to others (children, family)  Collaboration of Care: Psychiatrist AEB Dr. Jackey Ahktar        06/10/2024    1:00 PM 02/19/2024    2:20 PM 01/29/2024    2:07 PM  Depression screen PHQ 2/9  Decreased Interest 1 1 3   Down, Depressed, Hopeless 1 1 2   PHQ - 2 Score 2 2 5   Altered sleeping 0 2 3  Tired, decreased energy 1 1 3   Change in appetite 3 2 2   Feeling bad or failure about yourself  2 1 2   Trouble concentrating 0 1 2  Moving slowly or fidgety/restless 0 0 0  Suicidal thoughts 0  0  PHQ-9 Score 8  9  17    Difficult doing work/chores Somewhat difficult       Data saved with a previous flowsheet row definition       06/10/2024    1:01 PM 02/19/2024    2:20 PM 01/29/2024    2:07 PM 01/08/2024   11:04 AM  GAD 7 : Generalized Anxiety Score  Nervous, Anxious, on Edge 2 3 2 2   Control/stop worrying 3 3 3 1   Worry too much - different things 3 2 3 2   Trouble relaxing 1 2 3 1   Restless 1 1 1  0  Easily annoyed or irritable 2 2 3  0  Afraid - awful might happen 3 3 2 2   Total GAD 7 Score 15 16 17 8   Anxiety Difficulty Somewhat difficult Extremely difficult Very difficult Very difficult        Patient/Guardian was advised Release of Information must be obtained prior to any record release in order to collaborate their care with an outside provider. Patient/Guardian was advised if they have not already done so to contact the registration department to sign all necessary forms in order for us  to release information regarding their care.   Consent: Patient/Guardian gives verbal consent for treatment and assignment of benefits for services provided during this visit. Patient/Guardian expressed understanding and agreed to proceed.

## 2024-08-19 ENCOUNTER — Ambulatory Visit (HOSPITAL_COMMUNITY): Admitting: Licensed Clinical Social Worker

## 2024-08-19 DIAGNOSIS — F29 Unspecified psychosis not due to a substance or known physiological condition: Secondary | ICD-10-CM

## 2024-08-19 DIAGNOSIS — F411 Generalized anxiety disorder: Secondary | ICD-10-CM

## 2024-08-19 NOTE — Progress Notes (Unsigned)
 THERAPIST PROGRESS NOTE  Session Time:  11:03 am-11:45 am  Type of Therapy: Individual Therapy  Treatment Goals: Lanorris will manage anxiety and delusions as evidenced by living with some the anxiety or delusions without experiencing fear, increase self soothing, identify and challenge anxious/paranoid thoughts for 5 out of 7 days for 60 days.  Session Goals: living with anxiety or delusions without experiencing fear, Justo will practice problem solving skills 3 times per week for the next 4 weeks   Behavior: Patient noted that Christmas went well. They had Christmas at his mother's home and he stayed present, socialized with most of his family, but took breaks to step outside. He was able to manage being around people/family and being in a different setting other than his home/comfort zone.  Patient was oriented x4 (person, place, situation, and time) Patient was  Anxious. Patient was Casually dressed. Patient made moderate progress on his goals at this time.   Interventions: Therapist used CBT interventions of cognitive restructuring to address anxiety/paranoia.    Response: Patient has not been feeling down on himself. He may have a thought of his past behavior or hear a noise then will allow himself a moment to think or feel then move on. In the past he would hear a noise, and then spiral into paranoia thinking he is being watched or people online that he had a negative interaction with were tracking him. Patient is engaging in activities like playing guitar, and he tried doing some art. Patient noted that when he is sleeping well, engaging in what he enjoys (art, games, guitar, etc), and engaging with his family then his mood is stable.   Patient engaged in session. Patient responded well to interventions. Patient continues to meet criteria for Schizophrenia spectrum disorder with psychotic disorder type not yet determined (HCC)  GAD (generalized anxiety disorder)  Patient will continue in  outpatient therapy due to being the least restrictive service to meet his needs.   Plan: Patient will return in 2-4 weeks.   Suicidal/Homicidal:  No without intent/plan     Protective Factors: responsibility to others (children, family)  Collaboration of Care: Psychiatrist AEB Dr. Jackey Ahktar        06/10/2024    1:00 PM 02/19/2024    2:20 PM 01/29/2024    2:07 PM  Depression screen PHQ 2/9  Decreased Interest 1 1 3   Down, Depressed, Hopeless 1 1 2   PHQ - 2 Score 2 2 5   Altered sleeping 0 2 3  Tired, decreased energy 1 1 3   Change in appetite 3 2 2   Feeling bad or failure about yourself  2 1 2   Trouble concentrating 0 1 2  Moving slowly or fidgety/restless 0 0 0  Suicidal thoughts 0  0  PHQ-9 Score 8  9  17    Difficult doing work/chores Somewhat difficult       Data saved with a previous flowsheet row definition       06/10/2024    1:01 PM 02/19/2024    2:20 PM 01/29/2024    2:07 PM 01/08/2024   11:04 AM  GAD 7 : Generalized Anxiety Score  Nervous, Anxious, on Edge 2 3 2 2   Control/stop worrying 3 3 3 1   Worry too much - different things 3 2 3 2   Trouble relaxing 1 2 3 1   Restless 1 1 1  0  Easily annoyed or irritable 2 2 3  0  Afraid - awful might happen 3 3 2 2   Total GAD  7 Score 15 16 17 8   Anxiety Difficulty Somewhat difficult Extremely difficult Very difficult Very difficult        Patient/Guardian was advised Release of Information must be obtained prior to any record release in order to collaborate their care with an outside provider. Patient/Guardian was advised if they have not already done so to contact the registration department to sign all necessary forms in order for us  to release information regarding their care.   Consent: Patient/Guardian gives verbal consent for treatment and assignment of benefits for services provided during this visit. Patient/Guardian expressed understanding and agreed to proceed.

## 2024-09-02 ENCOUNTER — Other Ambulatory Visit (HOSPITAL_COMMUNITY): Payer: Self-pay | Admitting: Psychiatry

## 2024-09-04 ENCOUNTER — Ambulatory Visit (HOSPITAL_COMMUNITY): Admitting: Licensed Clinical Social Worker

## 2024-09-04 DIAGNOSIS — F411 Generalized anxiety disorder: Secondary | ICD-10-CM

## 2024-09-04 DIAGNOSIS — F29 Unspecified psychosis not due to a substance or known physiological condition: Secondary | ICD-10-CM

## 2024-09-04 NOTE — Progress Notes (Signed)
 THERAPIST PROGRESS NOTE  Session Time:  11:03 am-11:45 am  Type of Therapy: Individual Therapy  Treatment Goals: Riccardo will manage anxiety and delusions as evidenced by living with some the anxiety or delusions without experiencing fear, increase self soothing, identify and challenge anxious/paranoid thoughts for 5 out of 7 days for 60 days.  Session Goals: living with anxiety or delusions without experiencing fear, Renny will practice problem solving skills 3 times per week for the next 4 weeks   Behavior:  Patient presented for session calm and well groomed. He reported on a recent 4 day period of independence with no access to a car while his grandmother was out of town. Patient noted experiencing increased anxiety triggered by his dog's behavior (staring/barking at unseen objects), which briefly disrupted his stability. Patient denied experiencing active delusions or paranoia during this time. Patient demonstrated continued improved emotional regulation, reported that he has had times where he could sit with his thoughts without relying on typical avoidance or distraction behaviors (e.g. video games, guitar). Patient has been focused on physical wellness , specifically identifying a goal to to reduce junk food consumption to improve his overall health.   Interventions: Therapist used CBT interventions of cognitive restructuring to help patient label the anxiety as a physiological response rather than a symptom of relapse or a threat. Therapist used mindfulness interventions of mindful observation to reinforce his ability to sit with internal discomfort without immediate need for external stimuli.    Response: Patient responded positively and acknowledged that staying alone in the home with no car for 4 days was a win for his independence and tolerance. Patient has been able to use mindfulness to tolerate cognitions to see his anxiety and past delusions as a wave that passes rather  than a permanent state. He was also able to identify a connection between his diet and mental health noting that feeling better physically helps his mental health too. Patient noted that his lack of paranoia during a high stress period (being alone) is a significant sign of progress.   Patient engaged in session. Patient responded well to interventions. Patient continues to meet criteria for Schizophrenia spectrum disorder with psychotic disorder type not yet determined (HCC)  GAD (generalized anxiety disorder)  Patient will continue in outpatient therapy due to being the least restrictive service to meet his needs.   Plan: Patient will transfer to Adam Goldammer, LCSW for continued treatment. Patient will continue to identify his successes, sit with his thoughts, and monitor for any positive symptoms of schizophrenia and focusing on increasing periods of independence.   Suicidal/Homicidal:  No without intent/plan     Protective Factors: responsibility to others (children, family)  Collaboration of Care: Psychiatrist AEB Dr. Jackey Ahktar        06/10/2024    1:00 PM 02/19/2024    2:20 PM 01/29/2024    2:07 PM  Depression screen PHQ 2/9  Decreased Interest 1 1 3   Down, Depressed, Hopeless 1 1 2   PHQ - 2 Score 2 2 5   Altered sleeping 0 2 3  Tired, decreased energy 1 1 3   Change in appetite 3 2 2   Feeling bad or failure about yourself  2 1 2   Trouble concentrating 0 1 2  Moving slowly or fidgety/restless 0 0 0  Suicidal thoughts 0  0  PHQ-9 Score 8  9  17    Difficult doing work/chores Somewhat difficult       Data saved with a previous flowsheet row definition  06/10/2024    1:01 PM 02/19/2024    2:20 PM 01/29/2024    2:07 PM 01/08/2024   11:04 AM  GAD 7 : Generalized Anxiety Score  Nervous, Anxious, on Edge 2 3 2 2   Control/stop worrying 3 3 3 1   Worry too much - different things 3 2 3 2   Trouble relaxing 1 2 3 1   Restless 1 1 1  0  Easily annoyed or irritable 2 2 3  0   Afraid - awful might happen 3 3 2 2   Total GAD 7 Score 15 16 17 8   Anxiety Difficulty Somewhat difficult Extremely difficult Very difficult Very difficult        Patient/Guardian was advised Release of Information must be obtained prior to any record release in order to collaborate their care with an outside provider. Patient/Guardian was advised if they have not already done so to contact the registration department to sign all necessary forms in order for us  to release information regarding their care.   Consent: Patient/Guardian gives verbal consent for treatment and assignment of benefits for services provided during this visit. Patient/Guardian expressed understanding and agreed to proceed.

## 2024-09-11 ENCOUNTER — Ambulatory Visit (INDEPENDENT_AMBULATORY_CARE_PROVIDER_SITE_OTHER): Admitting: Licensed Clinical Social Worker

## 2024-09-11 DIAGNOSIS — F29 Unspecified psychosis not due to a substance or known physiological condition: Secondary | ICD-10-CM | POA: Diagnosis not present
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
## 2024-09-17 ENCOUNTER — Encounter (HOSPITAL_COMMUNITY): Payer: Self-pay | Admitting: Licensed Clinical Social Worker

## 2024-09-17 NOTE — Progress Notes (Signed)
 Behavioral Health Transition Summary 1. Administrative Overview Client Name/ID: Cody Roberts  Date of Transition: 01.23.2026 Reason for Transfer: Current clinician is transitioning out of clinic Total Duration of Treatment: Oct 2024-Jan 2026 Frequency of Sessions: Varied, started of every 2-3 weeks, monthly, but then increased to bi-weekly 2. Clinical Picture Primary Diagnosis (ICD-10/DSM-5): Schizoaffective Disorder, Bipolar type Secondary/Rule-out Diagnoses: None Current Medications: See medical chart Risk Assessment: History of substance abuse (Marijuana and Delta 8) and history of psychosis including delusions and hallucinations. Cody Roberts was assessed and/or hospitalized several times between March and April 2022 but has not been hospitalized since then. Cody Roberts has worried in the past that if he experiences a delusion or hallucination (people watching him outside his window, cameras taking pictures of him in his home, seeing bugs in his room, etc) then he is declining to the point of needing to go back to the hospital.  Suicidality/Homicidality:  No SI or HI. Self-Harm: None Safety Plan in Place? No but putting one in place not only for SI but also for increased psychosis (delusions, hallucinations) could be beneficial for both patient and therapist 3. Treatment Progress & Modalities Primary Modalities Used: individual, in person, CBT, ACT, SFBT Core Issues Addressed:  1) Distress tolerance for anxiety/delusions/paranoia at home and in public 2) Worthlessness 3)behavioral activation Recent Progress: Though stressful, Cody Roberts has been able to be at home without his grandmother for several days, and has gone out in public to the grocery store. He was playing golf during warmer weather in his back yard and had gone to a [place] driving range to play. He has been able to have a thought (memory from the past), hear a noise (paranoia), or experience stress and tolerate it. He has a variety of interests  including: recently got a guitar and has been learning songs, draws and paints (water color, etc), and plays video games with his brother online Current Barriers/Stagnation: Sleep schedule continues to fluctuate, self care/ADL's and caring for physical health are still a struggle, and experiences loneliness  4. [employer] Therapeutic Alliance: Strong task oriented alliance, he will be open with his thoughts and will follow through on homework tasks even if it may be uncomfortable for him. Transference/Countertransference Notes: Distrust at the beginning of treatment until therapeutic relationship is built 5. Recommendations for Next Therapist Immediate Goals for Next 3 Sessions: Build rapport- due to anxiety of transitioning to a new therapist, he will be more quiet the first session, (he is learning Girl from [employer], and  [employer] on guitar, interested in advertising account executive, plays video games like a new Pokemon game-he has beat it but still collecting Pokemon), update treatment plan and/or CCA Preferred Communication Style: Conversational, I don't want to do all the talking, Socratic questions, gentle directives/feedback,

## 2024-09-23 ENCOUNTER — Ambulatory Visit (HOSPITAL_COMMUNITY): Admitting: Licensed Clinical Social Worker

## 2024-10-30 ENCOUNTER — Telehealth (HOSPITAL_COMMUNITY): Admitting: Psychiatry
